# Patient Record
Sex: Female | Born: 1995 | Hispanic: Yes | Marital: Married | Smoking: Never smoker
Health system: Southern US, Community
[De-identification: ages and names within clinical notes are randomized; demographics above are authoritative.]

## PROBLEM LIST (undated history)

## (undated) ENCOUNTER — Inpatient Hospital Stay (HOSPITAL_COMMUNITY): Payer: Self-pay

## (undated) DIAGNOSIS — I10 Essential (primary) hypertension: Secondary | ICD-10-CM

## (undated) DIAGNOSIS — Z789 Other specified health status: Secondary | ICD-10-CM

## (undated) DIAGNOSIS — J111 Influenza due to unidentified influenza virus with other respiratory manifestations: Secondary | ICD-10-CM

## (undated) DIAGNOSIS — R51 Headache: Secondary | ICD-10-CM

## (undated) DIAGNOSIS — R519 Headache, unspecified: Secondary | ICD-10-CM

## (undated) HISTORY — PX: NO PAST SURGERIES: SHX2092

---

## 2015-11-07 ENCOUNTER — Ambulatory Visit (INDEPENDENT_AMBULATORY_CARE_PROVIDER_SITE_OTHER): Payer: Medicaid Other | Admitting: Advanced Practice Midwife

## 2015-11-07 ENCOUNTER — Other Ambulatory Visit (HOSPITAL_COMMUNITY)
Admission: RE | Admit: 2015-11-07 | Discharge: 2015-11-07 | Disposition: A | Discharge status: Home / Self Care | Payer: Medicaid Other | Source: Ambulatory Visit | Attending: Advanced Practice Midwife | Admitting: Advanced Practice Midwife

## 2015-11-07 ENCOUNTER — Encounter: Payer: Self-pay | Admitting: Family Medicine

## 2015-11-07 ENCOUNTER — Encounter: Payer: Self-pay | Admitting: Advanced Practice Midwife

## 2015-11-07 VITALS — BP 121/82 | HR 85 | Ht 62.0 in | Wt 175.4 lb

## 2015-11-07 DIAGNOSIS — K117 Disturbances of salivary secretion: Secondary | ICD-10-CM | POA: Diagnosis not present

## 2015-11-07 DIAGNOSIS — Z3492 Encounter for supervision of normal pregnancy, unspecified, second trimester: Secondary | ICD-10-CM

## 2015-11-07 DIAGNOSIS — Z124 Encounter for screening for malignant neoplasm of cervix: Secondary | ICD-10-CM | POA: Diagnosis not present

## 2015-11-07 DIAGNOSIS — R51 Headache: Secondary | ICD-10-CM | POA: Diagnosis not present

## 2015-11-07 DIAGNOSIS — Z01419 Encounter for gynecological examination (general) (routine) without abnormal findings: Secondary | ICD-10-CM | POA: Insufficient documentation

## 2015-11-07 DIAGNOSIS — O26899 Other specified pregnancy related conditions, unspecified trimester: Secondary | ICD-10-CM | POA: Insufficient documentation

## 2015-11-07 DIAGNOSIS — Z113 Encounter for screening for infections with a predominantly sexual mode of transmission: Secondary | ICD-10-CM | POA: Insufficient documentation

## 2015-11-07 DIAGNOSIS — O26892 Other specified pregnancy related conditions, second trimester: Secondary | ICD-10-CM

## 2015-11-07 DIAGNOSIS — Z3490 Encounter for supervision of normal pregnancy, unspecified, unspecified trimester: Secondary | ICD-10-CM | POA: Insufficient documentation

## 2015-11-07 DIAGNOSIS — R519 Headache, unspecified: Secondary | ICD-10-CM | POA: Insufficient documentation

## 2015-11-07 LAB — POCT URINALYSIS DIP (DEVICE)
BILIRUBIN URINE: NEGATIVE
GLUCOSE, UA: NEGATIVE mg/dL
HGB URINE DIPSTICK: NEGATIVE
Ketones, ur: NEGATIVE mg/dL
LEUKOCYTES UA: NEGATIVE
NITRITE: NEGATIVE
Protein, ur: NEGATIVE mg/dL
Specific Gravity, Urine: 1.02 (ref 1.005–1.030)
Urobilinogen, UA: 0.2 mg/dL (ref 0.0–1.0)
pH: 7 (ref 5.0–8.0)

## 2015-11-07 MED ORDER — GLYCOPYRROLATE 2 MG PO TABS: 2.0000 mg | ORAL_TABLET | Freq: Three times a day (TID) | ORAL | 3 refills | Status: DC | PRN

## 2015-11-07 NOTE — Progress Notes (Signed)
Here for first visit. Given new patient education packet.

## 2015-11-07 NOTE — Progress Notes (Addendum)
Subjective:    Deborah Steele is a G1P0 6514w1d being seen today for her first obstetrical visit. Dating is based on an estimated LMP and a 6w US for which [facility] not [facility] yet. Her obstetrical history is significant for obesity. Patient is not sure if she  intend to breast feed. Pregnancy history fully reviewed.  Patient reports headache. Believes these headaches are related to her wisdom teeth coming in because she has a lot of associated lower jaw pain. She has not yet been seen by a dentist.   Patient also reports a lot of congestion/mucus production that has started with this pregnancy.   Vitals:   11/07/15 0826 11/07/15 0829  BP: 121/82   Pulse: 85   Weight: 175 lb 6.4 oz (79.6 kg)   Height:  5\' 2"  (1.575 m)    HISTORY: OB History  Gravida Para Term Preterm AB Living  1            SAB TAB Ectopic Multiple Live Births               # Outcome Date GA Lbr Len/2nd Weight Sex Delivery Anes PTL Lv  1 Current              No past medical history on file. History reviewed. No pertinent surgical history. Family History  Problem Relation Age of Onset  . Hyperlipidemia Mother   . Hypertension Mother      Exam    Uterus:   ~16 week size  Pelvic Exam:    Perineum: No Hemorrhoids   Vulva: normal   Vagina:  normal mucosa   pH:    Cervix: no lesions   Adnexa: normal adnexa   Bony Pelvis: average  System: Breast:  breast exam not performed given age   Skin: normal coloration and turgor, no rashes    Neurologic: oriented, normal mood, grossly non-focal   Extremities: normal strength, tone, and muscle mass   HEENT extra ocular movement intact, thyroid without masses and trachea midline   Mouth/Teeth mucous membranes moist, pharynx normal without lesions   Neck supple   Cardiovascular: regular rate and rhythm   Respiratory:  appears well, vitals normal, no respiratory distress, acyanotic, normal RR, chest clear, no wheezing, crepitations, rhonchi,  normal symmetric air entry   Abdomen: soft, non-tender; bowel sounds normal; no masses,  no organomegaly   Urinary: urethral meatus normal      Assessment:    Pregnancy: G1P0 There are no active problems to display for this patient. 1. Supervision of normal pregnancy, second trimester  - POCT urinalysis dip (device) - Prenatal Multivit-Min-Fe-FA (PRENATAL VITAMINS PO); Take 1 tablet by mouth daily. - Glucose Tolerance, 1 HR (50g) - Prenatal Profile - Hemoglobinopathy Evaluation - Cytology - PAP - Culture, OB Urine - GC/Chlamydia probe amp ([place])not at [facility] - Pain Mgmt, Profile 6 Conf w/o mM, U - AFP, Quad Screen - US MFM OB COMP + 14 WK; Future  2. Ptyalism  - glycopyrrolate (ROBINUL) 2 MG tablet; Take 1 tablet (2 mg total) by mouth 3 (three) times daily as needed.  Dispense: 30 tablet; Refill: 3  3. Headache in pregnancy, antepartum, second trimester --Pt believes this is related to her teeth.  Recommend she see dentist. Dental letter provided.       Plan:     Initial labs drawn. PAP performed today.  1 hr GTT performed today due to obesity and ethnicity.  Prenatal vitamins. Problem list reviewed  and updated. Genetic Screening discussed Quad Screen: requested. Ultrasound discussed; fetal survey: ordered.        De HollingsheadCatherine L Cyndi Montejano 11/07/2015  CNM attestation:  I have seen and examined this patient; I agree with above documentation in the resident's note.   Deborah Steele is a 20 y.o. G1P0 @[redacted]w[redacted]d  by LMP in office for initial prenatal visit. Denies LOF, VB, contractions, vaginal discharge.  PE: BP 121/82   Pulse 85   Ht 5\' 2"  (1.575 m)   Wt 175 lb 6.4 oz (79.6 kg)   LMP 07/17/2015   BMI 32.08 kg/m  Gen: calm comfortable, NAD Resp: normal effort, no distress Abd: gravid  ROS, labs, PMH reviewed  FHT present by doppler  Plan: --Labs drawn today, including Quad screen --Anatomy US ordered at 19 weeks - continue routine follow up in OB  clinic  LEFTWICH-KIRBY, LISA, CNM 8:14 PM

## 2015-11-08 LAB — PAIN MGMT, PROFILE 6 CONF W/O MM, U
6 Acetylmorphine: NEGATIVE ng/mL (ref <10)
ALCOHOL METABOLITES: NEGATIVE ng/mL (ref <500)
AMPHETAMINES: NEGATIVE ng/mL (ref <500)
BENZODIAZEPINES: NEGATIVE ng/mL (ref <100)
Barbiturates: NEGATIVE ng/mL (ref <300)
CREATININE: 70.9 mg/dL (ref >=20.0)
Cocaine Metabolite: NEGATIVE ng/mL (ref <150)
MARIJUANA METABOLITE: NEGATIVE ng/mL (ref <20)
Methadone Metabolite: NEGATIVE ng/mL (ref <100)
OPIATES: NEGATIVE ng/mL (ref <100)
OXIDANT: NEGATIVE ug/mL (ref <200)
Oxycodone: NEGATIVE ng/mL (ref <100)
Phencyclidine: NEGATIVE ng/mL (ref <25)
Please note:: 0
pH: 7.65 (ref 4.5–9.0)

## 2015-11-08 LAB — GC/CHLAMYDIA PROBE AMP (~~LOC~~) NOT AT ARMC
CHLAMYDIA, DNA PROBE: NEGATIVE
NEISSERIA GONORRHEA: NEGATIVE

## 2015-11-08 LAB — PRENATAL PROFILE (SOLSTAS)
ANTIBODY SCREEN: NEGATIVE
BASOS PCT: 0 %
Basophils Absolute: 0 cells/uL (ref 0–200)
EOS PCT: 1 %
Eosinophils Absolute: 87 cells/uL (ref 15–500)
HEMATOCRIT: 36.2 % (ref 35.0–45.0)
HEMOGLOBIN: 12.1 g/dL (ref 11.7–15.5)
HIV 1&2 Ab, 4th Generation: NONREACTIVE
Hepatitis B Surface Ag: NEGATIVE
LYMPHS PCT: 19 %
Lymphs Abs: 1653 cells/uL (ref 850–3900)
MCH: 28.9 pg (ref 27.0–33.0)
MCHC: 33.4 g/dL (ref 32.0–36.0)
MCV: 86.6 fL (ref 80.0–100.0)
MONOS PCT: 7 %
MPV: 9.2 fL (ref 7.5–12.5)
Monocytes Absolute: 609 cells/uL (ref 200–950)
NEUTROS ABS: 6351 {cells}/uL (ref 1500–7800)
Neutrophils Relative %: 73 %
Platelets: 258 10*3/uL (ref 140–400)
RBC: 4.18 MIL/uL (ref 3.80–5.10)
RDW: 13.3 % (ref 11.0–15.0)
RH TYPE: POSITIVE
Rubella: 9.66 Index — ABNORMAL HIGH (ref <0.90)
WBC: 8.7 10*3/uL (ref 3.8–10.8)

## 2015-11-08 LAB — CULTURE, OB URINE: Organism ID, Bacteria: 10000

## 2015-11-08 LAB — GLUCOSE TOLERANCE, 1 HOUR (50G) W/O FASTING: Glucose, 1 Hr, gestational: 106 mg/dL (ref <140)

## 2015-11-09 ENCOUNTER — Telehealth: Payer: Self-pay | Admitting: *Deleted

## 2015-11-09 LAB — HEMOGLOBINOPATHY EVALUATION
HCT: 36.2 % (ref 35.0–45.0)
HEMOGLOBIN: 12.1 g/dL (ref 11.7–15.5)
Hgb A2 Quant: 2.7 % (ref 1.8–3.5)
Hgb A: 96.3 % (ref >96.0)
MCH: 28.9 pg (ref 27.0–33.0)
MCV: 86.6 fL (ref 80.0–100.0)
RBC: 4.18 MIL/uL (ref 3.80–5.10)
RDW: 13.3 % (ref 11.0–15.0)

## 2015-11-09 LAB — AFP, QUAD SCREEN
AFP: 23.3 ng/mL
Curr Gest Age: 16.1 weeks
HCG TOTAL: 112.27 [IU]/mL
INH: 217.2 pg/mL
INTERPRETATION-AFP: POSITIVE — AB
MOM FOR AFP: 0.78
MOM FOR INH: 1.33
MoM for hCG: 3.09
OPEN SPINA BIFIDA: NEGATIVE
Osb Risk: <1:27300 {titer}
TRI 18 SCR RISK EST: NEGATIVE
Trisomy 18 (Edward) Syndrome Interp.: 1:2540 {titer}
UE3 MOM: 0.35
uE3 Value: 0.29 ng/mL

## 2015-11-09 LAB — CYTOLOGY - PAP

## 2015-11-09 NOTE — Telephone Encounter (Signed)
Received a voice mail yesterday afternoon stating she is having pains.   Per chart review is [redacted] weeks pregnant. I called Deborah Steele and she reports having pain in waist area- then with further discussion states it is in her back and comes to the front. Denies pain with  Urination and per review urinalysis and culture from 11/07/15 negative. She denies any vaginal bleeding or discharge. States was having more pain yesterday- but today not much.  Wants to know what she can take. I advised may take tylenol and comfort measures- come to MAU if severe pain, or bleeding. She voices understanding.

## 2015-11-12 ENCOUNTER — Encounter: Payer: Self-pay | Admitting: General Practice

## 2015-11-12 NOTE — Progress Notes (Signed)
Patient had elevated quad screen risk for Downs. Patient has not had ultrasound yet to establish dating. Spoke with Dr Debroah LoopArnold regarding results who stated we can review dating from anatomy ultrasound on 9/6 at patient's next prenatal appt and evaluate need for referral for genetic counseling then once we have confirmed dating. May also recalculate quad with new dates if applicable

## 2015-11-19 ENCOUNTER — Encounter (HOSPITAL_COMMUNITY): Payer: Self-pay | Admitting: Advanced Practice Midwife

## 2015-11-21 ENCOUNTER — Telehealth: Payer: Self-pay | Admitting: *Deleted

## 2015-11-21 NOTE — Telephone Encounter (Signed)
Pt left message this morning stating that she has been having back problems and wants to know if she needs to be seen. I spoke to pt @ 1010 and discussed concerns. She reports that she has been out of work for a week due to back pain. She denied having an injury to cause the pain. She has tried OTC [employer] Hot with some relief. She has not taken tylenol. She also wants to know if this pain is normal and if it should increase with physical activity. She added that she works 5 hours daily and is not sure she can continue. I advised that she may try heating pad x30 - 60 minutes alternating with ice pack x20 minutes. While @ home she should alternate rest with light activity and try not to do too much at one time. She should also take tylenol per package directions. If she is not feeling better in 3-5 days, she may call back and see if a sooner appt is available. She is scheduled for next prenatal visit on 9/14 @ 0900. The type of pain which she has described is normal in pregnancy and the most common treatment is comfort measures. This is not typically a reason to be taken out of work for duration of pregnancy. Pt voiced understanding of all information and instructions given.

## 2015-11-28 ENCOUNTER — Ambulatory Visit (HOSPITAL_COMMUNITY)
Admission: RE | Admit: 2015-11-28 | Discharge: 2015-11-28 | Disposition: A | Discharge status: Home / Self Care | Payer: Medicaid Other | Source: Ambulatory Visit | Attending: Advanced Practice Midwife | Admitting: Advanced Practice Midwife

## 2015-11-28 ENCOUNTER — Encounter: Payer: Self-pay | Admitting: Advanced Practice Midwife

## 2015-11-28 DIAGNOSIS — Z36 Encounter for antenatal screening of mother: Secondary | ICD-10-CM | POA: Diagnosis not present

## 2015-11-28 DIAGNOSIS — Z3A16 16 weeks gestation of pregnancy: Secondary | ICD-10-CM | POA: Insufficient documentation

## 2015-11-28 DIAGNOSIS — Z3492 Encounter for supervision of normal pregnancy, unspecified, second trimester: Secondary | ICD-10-CM

## 2015-11-28 DIAGNOSIS — O280 Abnormal hematological finding on antenatal screening of mother: Secondary | ICD-10-CM | POA: Insufficient documentation

## 2015-12-05 ENCOUNTER — Telehealth: Payer: Self-pay | Admitting: *Deleted

## 2015-12-05 NOTE — Telephone Encounter (Signed)
Talked to [facility] this AM and talked to rep about recalculating patient's AFP Quad Screen.New due date by ultrasound given and she states will talk to her supervisor to see how to get this corrected and call back to office.

## 2015-12-06 ENCOUNTER — Ambulatory Visit (INDEPENDENT_AMBULATORY_CARE_PROVIDER_SITE_OTHER): Payer: Medicaid Other | Admitting: Advanced Practice Midwife

## 2015-12-06 VITALS — BP 134/78 | HR 82 | Wt 177.0 lb

## 2015-12-06 DIAGNOSIS — Z23 Encounter for immunization: Secondary | ICD-10-CM | POA: Diagnosis not present

## 2015-12-06 DIAGNOSIS — W460XXA Contact with hypodermic needle, initial encounter: Secondary | ICD-10-CM | POA: Insufficient documentation

## 2015-12-06 DIAGNOSIS — M5441 Lumbago with sciatica, right side: Secondary | ICD-10-CM

## 2015-12-06 DIAGNOSIS — Z3482 Encounter for supervision of other normal pregnancy, second trimester: Secondary | ICD-10-CM

## 2015-12-06 DIAGNOSIS — O9989 Other specified diseases and conditions complicating pregnancy, childbirth and the puerperium: Secondary | ICD-10-CM

## 2015-12-06 DIAGNOSIS — Z3492 Encounter for supervision of normal pregnancy, unspecified, second trimester: Secondary | ICD-10-CM

## 2015-12-06 DIAGNOSIS — W460XXD Contact with hypodermic needle, subsequent encounter: Secondary | ICD-10-CM

## 2015-12-06 LAB — POCT URINALYSIS DIP (DEVICE)
BILIRUBIN URINE: NEGATIVE
GLUCOSE, UA: NEGATIVE mg/dL
Hgb urine dipstick: NEGATIVE
KETONES UR: NEGATIVE mg/dL
Leukocytes, UA: NEGATIVE
Nitrite: NEGATIVE
PH: 6.5 (ref 5.0–8.0)
PROTEIN: NEGATIVE mg/dL
SPECIFIC GRAVITY, URINE: 1.015 (ref 1.005–1.030)
Urobilinogen, UA: 0.2 mg/dL (ref 0.0–1.0)

## 2015-12-06 MED ORDER — IBUPROFEN 600 MG PO TABS: 600.0000 mg | ORAL_TABLET | Freq: Four times a day (QID) | ORAL | 1 refills | Status: DC | PRN

## 2015-12-06 MED ORDER — POLYETHYLENE GLYCOL 3350 17 GM/SCOOP PO POWD: 17.0000 g | Freq: Every day | ORAL | 2 refills | Status: DC | PRN

## 2015-12-06 NOTE — Progress Notes (Signed)
Patient reports lower back pain & sciatic pain that is very severe at times making it difficult to walk or manage Patient's last quad was drawn too early- needs to be recollected (due date changed based off ultrasound) Scheduled follow u/s for 9/26 @ 10:15

## 2015-12-06 NOTE — Addendum Note (Signed)
Addended by: Dorathy KinsmanSMITH, Kam Kushnir on: 12/06/2015 11:05 AM   Modules accepted: Orders

## 2015-12-06 NOTE — Patient Instructions (Addendum)
Sciatica Sciatica is pain, weakness, numbness, or tingling along the path of the sciatic nerve. The nerve starts in the lower back and runs down the back of each leg. The nerve controls the muscles in the lower leg and in the back of the knee, while also providing sensation to the back of the thigh, lower leg, and the sole of your foot. Sciatica is a symptom of another medical condition. For instance, nerve damage or certain conditions, such as a herniated disk or bone spur on the spine, pinch or put pressure on the sciatic nerve. This causes the pain, weakness, or other sensations normally associated with sciatica. Generally, sciatica only affects one side of the body. CAUSES   Herniated or slipped disc.  Degenerative disk disease.  A pain disorder involving the narrow muscle in the buttocks (piriformis syndrome).  Pelvic injury or fracture.  Pregnancy.  Tumor (rare). SYMPTOMS  Symptoms can vary from mild to very severe. The symptoms usually travel from the low back to the buttocks and down the back of the leg. Symptoms can include:  Mild tingling or dull aches in the lower back, leg, or hip.  Numbness in the back of the calf or sole of the foot.  Burning sensations in the lower back, leg, or hip.  Sharp pains in the lower back, leg, or hip.  Leg weakness.  Severe back pain inhibiting movement. These symptoms may get worse with coughing, sneezing, laughing, or prolonged sitting or standing. Also, being overweight may worsen symptoms. DIAGNOSIS  Your caregiver will perform a physical exam to look for common symptoms of sciatica. He or she may ask you to do certain movements or activities that would trigger sciatic nerve pain. Other tests may be performed to find the cause of the sciatica. These may include:  Blood tests.  X-rays.  Imaging tests, such as an MRI or CT scan. TREATMENT  Treatment is directed at the cause of the sciatic pain. Sometimes, treatment is not necessary  and the pain and discomfort goes away on its own. If treatment is needed, your caregiver may suggest:  Over-the-counter medicines to relieve pain.  Prescription medicines, such as anti-inflammatory medicine, muscle relaxants, or narcotics.  Applying heat or ice to the painful area.  Steroid injections to lessen pain, irritation, and inflammation around the nerve.  Reducing activity during periods of pain.  Exercising and stretching to strengthen your abdomen and improve flexibility of your spine. Your caregiver may suggest losing weight if the extra weight makes the back pain worse.  Physical therapy.  Surgery to eliminate what is pressing or pinching the nerve, such as a bone spur or part of a herniated disk. HOME CARE INSTRUCTIONS   Only take over-the-counter or prescription medicines for pain or discomfort as directed by your caregiver.  Apply ice to the affected area for 20 minutes, 3-4 times a day for the first 48-72 hours. Then try heat in the same way.  Exercise, stretch, or perform your usual activities if these do not aggravate your pain.  Attend physical therapy sessions as directed by your caregiver.  Keep all follow-up appointments as directed by your caregiver.  Do not wear high heels or shoes that do not provide proper support.  Check your mattress to see if it is too soft. A firm mattress may lessen your pain and discomfort. SEEK IMMEDIATE MEDICAL CARE IF:   You lose control of your bowel or bladder (incontinence).  You have increasing weakness in the lower back, pelvis, buttocks,   or legs.  You have redness or swelling of your back.  You have a burning sensation when you urinate.  You have pain that gets worse when you lie down or awakens you at night.  Your pain is worse than you have experienced in the past.  Your pain is lasting longer than 4 weeks.  You are suddenly losing weight without reason. MAKE SURE YOU:  Understand these  instructions.  Will watch your condition.  Will get help right away if you are not doing well or get worse.   This information is not intended to replace advice given to you by your health care provider. Make sure you discuss any questions you have with your health care provider.   Document Released: 03/04/2001 Document Revised: 11/29/2014 Document Reviewed: 07/20/2011 Elsevier Interactive Patient Education 2016 [employer].    Constipation, Adult Constipation is when a person has fewer than three bowel movements a week, has difficulty having a bowel movement, or has stools that are dry, hard, or larger than normal. As people grow older, constipation is more common. A low-fiber diet, not taking in enough fluids, and taking certain medicines may make constipation worse.  CAUSES   Certain medicines, such as antidepressants, pain medicine, iron supplements, antacids, and water pills.   Certain diseases, such as diabetes, irritable bowel syndrome (IBS), thyroid disease, or depression.   Not drinking enough water.   Not eating enough fiber-rich foods.   Stress or travel.   Lack of physical activity or exercise.   Ignoring the urge to have a bowel movement.   Using laxatives too much.  SIGNS AND SYMPTOMS   Having fewer than three bowel movements a week.   Straining to have a bowel movement.   Having stools that are hard, dry, or larger than normal.   Feeling full or bloated.   Pain in the lower abdomen.   Not feeling relief after having a bowel movement.  DIAGNOSIS  Your health care provider will take a medical history and perform a physical exam. Further testing may be done for severe constipation. Some tests may include:  A barium enema X-ray to examine your rectum, colon, and, sometimes, your small intestine.   A sigmoidoscopy to examine your lower colon.   A colonoscopy to examine your entire colon. TREATMENT  Treatment will depend on the severity  of your constipation and what is causing it. Some dietary treatments include drinking more fluids and eating more fiber-rich foods. Lifestyle treatments may include regular exercise. If these diet and lifestyle recommendations do not help, your health care provider may recommend taking over-the-counter laxative medicines to help you have bowel movements. Prescription medicines may be prescribed if over-the-counter medicines do not work.  HOME CARE INSTRUCTIONS   Eat foods that have a lot of fiber, such as fruits, vegetables, whole grains, and beans.  Limit foods high in fat and processed sugars, such as french fries, hamburgers, cookies, candies, and soda.   A fiber supplement may be added to your diet if you cannot get enough fiber from foods.   Drink enough fluids to keep your urine clear or pale yellow.   Exercise regularly or as directed by your health care provider.   Go to the restroom when you have the urge to go. Do not hold it.   Only take over-the-counter or prescription medicines as directed by your health care provider. Do not take other medicines for constipation without talking to your health care provider first.  [facility] IMMEDIATE MEDICAL  CARE IF:   You have bright red blood in your stool.   Your constipation lasts for more than 4 days or gets worse.   You have abdominal or rectal pain.   You have thin, pencil-like stools.   You have unexplained weight loss. MAKE SURE YOU:   Understand these instructions.  Will watch your condition.  Will get help right away if you are not doing well or get worse.   This information is not intended to replace advice given to you by your health care provider. Make sure you discuss any questions you have with your health care provider.   Document Released: 12/07/2003 Document Revised: 03/31/2014 Document Reviewed: 12/20/2012 Elsevier Interactive Patient Education 2016 [employer]  Safe Medications in Pregnancy    Acne: Benzoyl Peroxide Salicylic Acid  Backache/Headache: Tylenol: 2 regular strength every 4 hours OR              2 Extra strength every 6 hours  Colds/Coughs/Allergies: Benadryl (alcohol free) 25 mg every 6 hours as needed Breath right strips Claritin Cepacol throat lozenges Chloraseptic throat spray Cold-Eeze- up to three times per day Cough drops, alcohol free Flonase (by prescription only) Guaifenesin Mucinex Robitussin DM (plain only, alcohol free) Saline nasal spray/drops Sudafed (pseudoephedrine) & Actifed ** use only after [redacted] weeks gestation and if you do not have high blood pressure Tylenol Vicks Vaporub Zinc lozenges Zyrtec   Constipation: Colace Ducolax suppositories Fleet enema Glycerin suppositories Metamucil Milk of magnesia Miralax Senokot Smooth move tea  Diarrhea: Kaopectate Imodium A-D  *NO pepto Bismol  Hemorrhoids: Anusol Anusol HC Preparation H Tucks  Indigestion: Tums Maalox Mylanta Zantac  Pepcid  Insomnia: Benadryl (alcohol free) 25mg  every 6 hours as needed Tylenol PM Unisom, no Gelcaps  Leg Cramps: Tums MagGel  Nausea/Vomiting:  Bonine Dramamine Emetrol Ginger extract Sea bands Meclizine  Nausea medication to take during pregnancy:  Unisom (doxylamine succinate 25 mg tablets) Take one tablet daily at bedtime. If symptoms are not adequately controlled, the dose can be increased to a maximum recommended dose of two tablets daily (1/2 tablet in the morning, 1/2 tablet mid-afternoon and one at bedtime). Vitamin B6 100mg  tablets. Take one tablet twice a day (up to 200 mg per day).  Skin Rashes: Aveeno products Benadryl cream or 25mg  every 6 hours as needed Calamine Lotion 1% cortisone cream  Yeast infection: Gyne-lotrimin 7 Monistat 7   **If taking multiple medications, please check labels to avoid duplicating the same active ingredients **take medication as directed on the label ** Do not exceed 4000  mg of tylenol in 24 hours **Do not take medications that contain aspirin or ibuprofen

## 2015-12-06 NOTE — Progress Notes (Signed)
   PRENATAL VISIT NOTE  Subjective:  Pennie Rushingna Talton is a 20 y.o. G1P0 at 3432w0d being seen today for ongoing prenatal care.  She is currently monitored for the following issues for this low-risk pregnancy and has Supervision of normal pregnancy; Ptyalism; Headache in pregnancy, antepartum; Abnormal quad screen; and Needle stick, hypodermic, accidental on her problem list.  Patient reports backache.  Contractions: Not present. Vag. Bleeding: None.  Movement: Absent. Denies leaking of fluid.   The following portions of the patient's history were reviewed and updated as appropriate: allergies, current medications, past family history, past medical history, past social history, past surgical history and problem list. Problem list updated.  Objective:   Vitals:   12/06/15 0941  BP: 134/78  Pulse: 82  Weight: 177 lb (80.3 kg)    Fetal Status: Fetal Heart Rate (bpm): 152   Movement: Absent     General:  Alert, oriented and cooperative. Patient is in no acute distress.  Skin: Skin is warm and dry. No rash noted.   Cardiovascular: Normal heart rate noted  Respiratory: Normal respiratory effort, no problems with respiration noted  Abdomen: Soft, gravid, appropriate for gestational age. Pain/Pressure: Present     Pelvic:  Cervical exam deferred        Extremities: Normal range of motion.  Edema: Trace  Mental Status: Normal mood and affect. Normal behavior. Normal judgment and thought content.   Urinalysis: Urine Protein: Negative Urine Glucose: Negative  Assessment and Plan:  Pregnancy: G1P0 at 632w0d  1. Supervision of normal pregnancy in second trimester  - US MFM OB FOLLOW UP; Future - Flu Vaccine QUAD 36+ mos IM (Fluarix, Quad PF)  2. Needle stick, hypodermic, accidental, subsequent encounter   3. Acute back pain with sciatica, right   Preterm labor symptoms and general obstetric precautions including but not limited to vaginal bleeding, contractions, leaking of fluid and fetal  movement were reviewed in detail with the patient. Please refer to After Visit Summary for other counseling recommendations.  Return in about 4 weeks (around 01/03/2016) for ROB.  Dorathy KinsmanVirginia Mozelle Remlinger, CNM

## 2015-12-11 LAB — AFP, QUAD SCREEN
AFP: 60.4 ng/mL
Curr Gest Age: 18 weeks
HCG TOTAL: 39.83 [IU]/mL
INH: 153.7 pg/mL
INTERPRETATION-AFP: NEGATIVE
MOM FOR AFP: 1.64
MOM FOR INH: 1.21
MoM for hCG: 1.89
OPEN SPINA BIFIDA: NEGATIVE
TRI 18 SCR RISK EST: NEGATIVE
Trisomy 18 (Edward) Syndrome Interp.: 1:90900 {titer}
UE3 MOM: 0.73
UE3 VALUE: 1.06 ng/mL

## 2015-12-12 ENCOUNTER — Telehealth: Payer: Self-pay | Admitting: *Deleted

## 2015-12-12 NOTE — Telephone Encounter (Signed)
Patient made aware that recalculated QAUD screen result made her AFP result normal.

## 2015-12-18 ENCOUNTER — Ambulatory Visit (HOSPITAL_COMMUNITY)
Admission: RE | Admit: 2015-12-18 | Discharge: 2015-12-18 | Disposition: A | Discharge status: Home / Self Care | Payer: Medicaid Other | Source: Ambulatory Visit | Attending: Advanced Practice Midwife | Admitting: Advanced Practice Midwife

## 2015-12-18 ENCOUNTER — Other Ambulatory Visit: Payer: Self-pay | Admitting: Advanced Practice Midwife

## 2015-12-18 DIAGNOSIS — Z36 Encounter for antenatal screening of mother: Secondary | ICD-10-CM | POA: Insufficient documentation

## 2015-12-18 DIAGNOSIS — Z0489 Encounter for examination and observation for other specified reasons: Secondary | ICD-10-CM

## 2015-12-18 DIAGNOSIS — Z3A19 19 weeks gestation of pregnancy: Secondary | ICD-10-CM | POA: Diagnosis not present

## 2015-12-18 DIAGNOSIS — IMO0002 Reserved for concepts with insufficient information to code with codable children: Secondary | ICD-10-CM

## 2015-12-18 DIAGNOSIS — Z3492 Encounter for supervision of normal pregnancy, unspecified, second trimester: Secondary | ICD-10-CM

## 2015-12-27 ENCOUNTER — Inpatient Hospital Stay (HOSPITAL_COMMUNITY)
Admission: AD | Admit: 2015-12-27 | Discharge: 2015-12-27 | Disposition: A | Discharge status: Home / Self Care | Payer: Medicaid Other | Source: Ambulatory Visit | Attending: Obstetrics & Gynecology | Admitting: Obstetrics & Gynecology

## 2015-12-27 ENCOUNTER — Encounter (HOSPITAL_COMMUNITY): Payer: Self-pay | Admitting: *Deleted

## 2015-12-27 DIAGNOSIS — R109 Unspecified abdominal pain: Secondary | ICD-10-CM | POA: Insufficient documentation

## 2015-12-27 DIAGNOSIS — N803C9 Endometriosis of the uterosacral ligament(s), unspecified side, unspecified depth: Secondary | ICD-10-CM

## 2015-12-27 DIAGNOSIS — Z3A21 21 weeks gestation of pregnancy: Secondary | ICD-10-CM | POA: Diagnosis not present

## 2015-12-27 DIAGNOSIS — N803 Endometriosis of pelvic peritoneum: Secondary | ICD-10-CM

## 2015-12-27 DIAGNOSIS — O26892 Other specified pregnancy related conditions, second trimester: Secondary | ICD-10-CM | POA: Insufficient documentation

## 2015-12-27 DIAGNOSIS — O280 Abnormal hematological finding on antenatal screening of mother: Secondary | ICD-10-CM

## 2015-12-27 HISTORY — DX: Other specified health status: Z78.9

## 2015-12-27 LAB — URINALYSIS, ROUTINE W REFLEX MICROSCOPIC
Bilirubin Urine: NEGATIVE
GLUCOSE, UA: NEGATIVE mg/dL
Hgb urine dipstick: NEGATIVE
Ketones, ur: NEGATIVE mg/dL
LEUKOCYTES UA: NEGATIVE
Nitrite: NEGATIVE
PROTEIN: NEGATIVE mg/dL
SPECIFIC GRAVITY, URINE: 1.02 (ref 1.005–1.030)
pH: 6 (ref 5.0–8.0)

## 2015-12-27 NOTE — MAU Provider Note (Signed)
None     Chief Complaint:  Abdominal Pain   Deborah Steele is  20 y.o. G1P0 at 4859w0d presents complaining of Abdominal Pain .  It happened twice along the right side of her pelvis.  Pain c/w round ligament Obstetrical/Gynecological History: OB History    Gravida Para Term Preterm AB Living   1         0   SAB TAB Ectopic Multiple Live Births                 Past Medical History: Past Medical History:  Diagnosis Date  . Medical history non-contributory     Past Surgical History: Past Surgical History:  Procedure Laterality Date  . NO PAST SURGERIES      Family History: Family History  Problem Relation Age of Onset  . Hyperlipidemia Mother   . Hypertension Mother     Social History: Social History  Substance Use Topics  . Smoking status: Never Smoker  . Smokeless tobacco: Never Used  . Alcohol use No    Allergies: No Known Allergies  Meds:  Prescriptions Prior to Admission  Medication Sig Dispense Refill Last Dose  . acetaminophen (TYLENOL) 500 MG tablet Take 500 mg by mouth every 6 (six) hours as needed.   Past Month at Unknown time  . glycopyrrolate (ROBINUL) 2 MG tablet Take 1 tablet (2 mg total) by mouth 3 (three) times daily as needed. 30 tablet 3 Past Month at Unknown time  . Prenatal Vit-Fe Fumarate-FA (PRENATAL MULTIVITAMIN) TABS tablet Take 1 tablet by mouth daily at 12 noon.   12/26/2015 at Unknown time  . ibuprofen (ADVIL,MOTRIN) 600 MG tablet Take 1 tablet (600 mg total) by mouth every 6 (six) hours as needed. 30 tablet 1 Has not started  . polyethylene glycol powder (GLYCOLAX/MIRALAX) powder Take 17 g by mouth daily as needed for moderate constipation. 500 g 2 Has not started    Review of Systems   Constitutional: Negative for fever and chills Eyes: Negative for visual disturbances Respiratory: Negative for shortness of breath, dyspnea Cardiovascular: Negative for chest pain or palpitations  Gastrointestinal: Negative for vomiting, diarrhea and  constipation Genitourinary: Negative for dysuria and urgency Musculoskeletal: Negative for back pain, joint pain, myalgias.  Normal ROM  Neurological: Negative for dizziness and headaches    Physical Exam  Blood pressure 122/75, pulse 88, temperature 98.2 F (36.8 C), temperature source Oral, resp. rate 19, height 5\' 2"  (1.575 m), weight 83.5 kg (184 lb), last menstrual period 07/17/2015, SpO2 100 %. GENERAL: Well-developed, well-nourished female in no acute distress.  LUNGS: Clear to auscultation bilaterally.  HEART: Regular rate and rhythm. ABDOMEN: Soft, nontender, nondistended, gravid.  EXTREMITIES: Nontender, no edema, 2+ distal pulses. DTR's 2+ FHT 150 doppler Labs: Results for orders placed or performed during the hospital encounter of 12/27/15 (from the past 24 hour(s))  Urinalysis, Routine w reflex microscopic ([facility])   Collection Time: 12/27/15  8:43 PM  Result Value Ref Range   Color, Urine YELLOW YELLOW   APPearance CLEAR CLEAR   Specific Gravity, Urine 1.020 1.005 - 1.030   pH 6.0 5.0 - 8.0   Glucose, UA NEGATIVE NEGATIVE mg/dL   Hgb urine dipstick NEGATIVE NEGATIVE   Bilirubin Urine NEGATIVE NEGATIVE   Ketones, ur NEGATIVE NEGATIVE mg/dL   Protein, ur NEGATIVE NEGATIVE mg/dL   Nitrite NEGATIVE NEGATIVE   Leukocytes, UA NEGATIVE NEGATIVE   Imaging Studies:  [place] Mfm Ob Comp + 14 Wk  Result Date: 11/28/2015 OBSTETRICAL  ULTRASOUND: This exam was performed within a [place] [facility]. The OB US report was generated in the AS system, and faxed to the ordering physician.  This report is available in the [employer]. See the AS Obstetric US report via the Image Link.  [place] Mfm Ob Follow Up  Result Date: 12/18/2015 OBSTETRICAL ULTRASOUND: This exam was performed within a [place] [facility]. The OB US report was generated in the AS system, and faxed to the ordering physician.  This report is available in the [employer]. See the AS  Obstetric US report via the Image Link.   Assessment: Deborah Steele is  19 y.o. G1P0 at [redacted]w[redacted]d presents with round ligament pain..  Plan: DC home. Tips given  CRESENZO-DISHMAN,Braden Cimo 10/5/20179:59 PM

## 2015-12-27 NOTE — MAU Note (Signed)
Pt c/o RLQ pain that she noticed yesterday but today got much worse. States that the pain is achy and sometimes crampy. Rates 5/10. Has not taken any medications or warm compress. Hurts more with movement. Denies vaginal bleeding or discharge.

## 2015-12-27 NOTE — Discharge Instructions (Signed)

## 2016-01-03 ENCOUNTER — Ambulatory Visit (INDEPENDENT_AMBULATORY_CARE_PROVIDER_SITE_OTHER): Payer: Medicaid Other | Admitting: Obstetrics and Gynecology

## 2016-01-03 DIAGNOSIS — Z3402 Encounter for supervision of normal first pregnancy, second trimester: Secondary | ICD-10-CM

## 2016-01-03 DIAGNOSIS — Z3400 Encounter for supervision of normal first pregnancy, unspecified trimester: Secondary | ICD-10-CM | POA: Insufficient documentation

## 2016-01-03 NOTE — Patient Instructions (Signed)
Round Ligament Pain  The round ligament is a cord of muscle and tissue that helps to support the uterus. It can become a source of pain during pregnancy if it becomes stretched or twisted as the baby grows. The pain usually begins in the second trimester of pregnancy, and it can come and go until the baby is delivered. It is not a serious problem, and it does not cause harm to the baby.  Round ligament pain is usually a short, sharp, and pinching pain, but it can also be a dull, lingering, and aching pain. The pain is felt in the lower side of the abdomen or in the groin. It usually starts deep in the groin and moves up to the outside of the hip area. Pain can occur with:   A sudden change in position.   Rolling over in bed.   Coughing or sneezing.   Physical activity.  HOME CARE INSTRUCTIONS  Watch your condition for any changes. Take these steps to help with your pain:   When the pain starts, relax. Then try:    Sitting down.    Flexing your knees up to your abdomen.    Lying on your side with one pillow under your abdomen and another pillow between your legs.    Sitting in a warm bath for 15-20 minutes or until the pain goes away.   Take over-the-counter and prescription medicines only as told by your health care provider.   Move slowly when you sit and stand.   Avoid long walks if they cause pain.   Stop or lessen your physical activities if they cause pain.  SEEK MEDICAL CARE IF:   Your pain does not go away with treatment.   You feel pain in your back that you did not have before.   Your medicine is not helping.  SEEK IMMEDIATE MEDICAL CARE IF:   You develop a fever or chills.   You develop uterine contractions.   You develop vaginal bleeding.   You develop nausea or vomiting.   You develop diarrhea.   You have pain when you urinate.     This information is not intended to replace advice given to you by your health care provider. Make sure you discuss any questions you have with your health  care provider.     Document Released: 12/18/2007 Document Revised: 06/02/2011 Document Reviewed: 05/17/2014  Elsevier Interactive Patient Education 2016 Elsevier Inc.

## 2016-01-03 NOTE — Progress Notes (Signed)
Subjective:  Deborah Steele is a 20 y.o. G1P0 at 4842w0d being seen today for ongoing prenatal care.  She is currently monitored for the following issues for this low-risk pregnancy and has Ptyalism; Headache in pregnancy, antepartum; Needle stick, hypodermic, accidental; and Supervision of low-risk first pregnancy on her problem list.  Patient reports round liagment pain.   .  .  Movement: Present. Denies leaking of fluid.   The following portions of the patient's history were reviewed and updated as appropriate: allergies, current medications, past family history, past medical history, past social history, past surgical history and problem list. Problem list updated.  Objective:   Vitals:   01/03/16 0812  BP: 121/77  Pulse: 84  Weight: 186 lb 1.6 oz (84.4 kg)    Fetal Status: Fetal Heart Rate (bpm): 145   Movement: Present     General:  Alert, oriented and cooperative. Patient is in no acute distress.  Skin: Skin is warm and dry. No rash noted.   Cardiovascular: Normal heart rate noted  Respiratory: Normal respiratory effort, no problems with respiration noted  Abdomen: Soft, gravid, appropriate for gestational age. Pain/Pressure: Present     Pelvic:  Cervical exam deferred        Extremities: Normal range of motion.     Mental Status: Normal mood and affect. Normal behavior. Normal judgment and thought content.   Urinalysis:      Assessment and Plan:  Pregnancy: G1P0 at 6542w0d  1. Encounter for supervision of low-risk first pregnancy in second trimester Round ligament pain reviewed with pt.  Preterm labor symptoms and general obstetric precautions including but not limited to vaginal bleeding, contractions, leaking of fluid and fetal movement were reviewed in detail with the patient. Please refer to After Visit Summary for other counseling recommendations.  Return in about 4 weeks (around 01/31/2016) for OB visit.   Hermina StaggersMichael L Nanna Ertle, MD

## 2016-01-31 ENCOUNTER — Ambulatory Visit (INDEPENDENT_AMBULATORY_CARE_PROVIDER_SITE_OTHER): Payer: Medicaid Other | Admitting: Family

## 2016-01-31 DIAGNOSIS — Z3482 Encounter for supervision of other normal pregnancy, second trimester: Secondary | ICD-10-CM

## 2016-01-31 DIAGNOSIS — Z3402 Encounter for supervision of normal first pregnancy, second trimester: Secondary | ICD-10-CM

## 2016-01-31 NOTE — Patient Instructions (Signed)

## 2016-01-31 NOTE — Progress Notes (Signed)
   PRENATAL VISIT NOTE  Subjective:  Deborah Steele is a 20 y.o. G1P0 at 205w0d being seen today for ongoing prenatal care.  She is currently monitored for the following issues for this low-risk pregnancy and has Ptyalism; Headache in pregnancy, antepartum; Needle stick, hypodermic, accidental; and Supervision of low-risk first pregnancy on her problem list.  Patient reports backache.  Contractions: Not present.  .  Movement: Present. Denies leaking of fluid.   The following portions of the patient's history were reviewed and updated as appropriate: allergies, current medications, past family history, past medical history, past social history, past surgical history and problem list. Problem list updated.  Objective:   Vitals:   01/31/16 0813  BP: 133/72  Pulse: 87  Weight: 193 lb 9.6 oz (87.8 kg)    Fetal Status: Fetal Heart Rate (bpm): 154   Movement: Present     General:  Alert, oriented and cooperative. Patient is in no acute distress.  Skin: Skin is warm and dry. No rash noted.   Cardiovascular: Normal heart rate noted  Respiratory: Normal respiratory effort, no problems with respiration noted  Abdomen: Soft, gravid, appropriate for gestational age. Pain/Pressure: Present     Pelvic:  Cervical exam deferred        Extremities: Normal range of motion.     Mental Status: Normal mood and affect. Normal behavior. Normal judgment and thought content.   Assessment and Plan:  Pregnancy: G1P0 at 355w0d  1. Encounter for supervision of low-risk first pregnancy in second trimester  Preterm labor symptoms and general obstetric precautions including but not limited to vaginal bleeding, contractions, leaking of fluid and fetal movement were reviewed in detail with the patient. Encouraged physical exercise (30 min daily) and yoga to relieve backache. Reviewed instructions for 2 hour GTT.  Please refer to After Visit Summary for other counseling recommendations.  Return in about 2 weeks (around  02/14/2016) for 2 hr fasting GTT and prenatal visit.   Deborah Steele, CNM

## 2016-02-19 ENCOUNTER — Ambulatory Visit (INDEPENDENT_AMBULATORY_CARE_PROVIDER_SITE_OTHER): Payer: Medicaid Other | Admitting: Family

## 2016-02-19 VITALS — BP 130/83 | HR 95 | Wt 198.0 lb

## 2016-02-19 DIAGNOSIS — Z3403 Encounter for supervision of normal first pregnancy, third trimester: Secondary | ICD-10-CM

## 2016-02-19 DIAGNOSIS — Z3493 Encounter for supervision of normal pregnancy, unspecified, third trimester: Secondary | ICD-10-CM | POA: Diagnosis present

## 2016-02-19 DIAGNOSIS — N898 Other specified noninflammatory disorders of vagina: Secondary | ICD-10-CM | POA: Diagnosis not present

## 2016-02-19 DIAGNOSIS — O26893 Other specified pregnancy related conditions, third trimester: Secondary | ICD-10-CM

## 2016-02-19 DIAGNOSIS — Z23 Encounter for immunization: Secondary | ICD-10-CM | POA: Diagnosis not present

## 2016-02-19 LAB — CBC
HEMATOCRIT: 34 % — AB (ref 35.0–45.0)
Hemoglobin: 11.2 g/dL — ABNORMAL LOW (ref 11.7–15.5)
MCH: 28.6 pg (ref 27.0–33.0)
MCHC: 32.9 g/dL (ref 32.0–36.0)
MCV: 87 fL (ref 80.0–100.0)
MPV: 9 fL (ref 7.5–12.5)
PLATELETS: 265 10*3/uL (ref 140–400)
RBC: 3.91 MIL/uL (ref 3.80–5.10)
RDW: 14 % (ref 11.0–15.0)
WBC: 10.1 10*3/uL (ref 3.8–10.8)

## 2016-02-19 MED ORDER — TETANUS-DIPHTH-ACELL PERTUSSIS 5-2.5-18.5 LF-MCG/0.5 IM SUSP
0.5000 mL | Freq: Once | INTRAMUSCULAR | Status: AC
  Administered 2016-02-19: 0.5 mL via INTRAMUSCULAR

## 2016-02-19 NOTE — Progress Notes (Signed)
28 wk labs today 28 wk packet given  tdap vaccine given today

## 2016-02-19 NOTE — Progress Notes (Signed)
   PRENATAL VISIT NOTE  Subjective:  Deborah Steele is a 20 y.o. G1P0 at 9162w5d being seen today for ongoing prenatal care.  She is currently monitored for the following issues for this low-risk pregnancy and has Ptyalism; Headache in pregnancy, antepartum; Needle stick, hypodermic, accidental; Supervision of low-risk first pregnancy; and Vaginal discharge during pregnancy in third trimester on her problem list.  Patient reports vaginal discharge, intermittent with odor.  No report of itching.  Contractions: Not present. Vag. Bleeding: None.  Movement: Present. Denies leaking of fluid.   The following portions of the patient's history were reviewed and updated as appropriate: allergies, current medications, past family history, past medical history, past social history, past surgical history and problem list. Problem list updated.  Objective:   Vitals:   02/19/16 0813  BP: 130/83  Pulse: 95  Weight: 198 lb (89.8 kg)    Fetal Status: Fetal Heart Rate (bpm): 153 Fundal Height: 29 cm Movement: Present     General:  Alert, oriented and cooperative. Patient is in no acute distress.  Skin: Skin is warm and dry. No rash noted.   Cardiovascular: Normal heart rate noted  Respiratory: Normal respiratory effort, no problems with respiration noted  Abdomen: Soft, gravid, appropriate for gestational age. Pain/Pressure: Present     Pelvic:  Cervical exam deferred        Extremities: Normal range of motion.  Edema: Trace  Mental Status: Normal mood and affect. Normal behavior. Normal judgment and thought content.   Assessment and Plan:  Pregnancy: G1P0 at 7362w5d  1. Supervision of low-risk pregnancy, third trimester - Glucose Tolerance, 2 Hours w/1 Hour - CBC - RPR - HIV antibody (with reflex)  2. Encounter for supervision of low-risk first pregnancy in third trimester - Reviewed new OB lab results and ultrasound date change.  3. Vaginal discharge during pregnancy in third trimester - Wet  prep, genital  Preterm labor symptoms and general obstetric precautions including but not limited to vaginal bleeding, contractions, leaking of fluid and fetal movement were reviewed in detail with the patient. Please refer to After Visit Summary for other counseling recommendations.  Return in about 2 weeks (around 03/04/2016).   Eino FarberWalidah Kennith GainN Karim, CNM

## 2016-02-20 LAB — WET PREP, GENITAL
Trich, Wet Prep: NONE SEEN
YEAST WET PREP: NONE SEEN

## 2016-02-20 LAB — HIV ANTIBODY (ROUTINE TESTING W REFLEX): HIV 1&2 Ab, 4th Generation: NONREACTIVE

## 2016-02-20 LAB — GLUCOSE TOLERANCE, 2 HOURS W/ 1HR
GLUCOSE, 2 HOUR: 123 mg/dL (ref <140)
GLUCOSE: 147 mg/dL
Glucose, Fasting: 77 mg/dL (ref 65–99)

## 2016-02-20 LAB — RPR

## 2016-02-22 ENCOUNTER — Other Ambulatory Visit: Payer: Self-pay | Admitting: Family

## 2016-02-22 DIAGNOSIS — B9689 Other specified bacterial agents as the cause of diseases classified elsewhere: Secondary | ICD-10-CM

## 2016-02-22 DIAGNOSIS — N760 Acute vaginitis: Principal | ICD-10-CM

## 2016-02-22 DIAGNOSIS — J111 Influenza due to unidentified influenza virus with other respiratory manifestations: Secondary | ICD-10-CM

## 2016-02-22 HISTORY — DX: Influenza due to unidentified influenza virus with other respiratory manifestations: J11.1

## 2016-02-22 MED ORDER — METRONIDAZOLE 500 MG PO TABS: 500.0000 mg | ORAL_TABLET | Freq: Two times a day (BID) | ORAL | 0 refills | Status: DC

## 2016-02-22 NOTE — Progress Notes (Signed)
Pt called and given results of third trimester labs and wet prep.  Plans to pick up flagyl at the pharmacy.

## 2016-02-26 ENCOUNTER — Telehealth: Payer: Self-pay

## 2016-02-26 NOTE — Telephone Encounter (Signed)
Pt called requesting another Rx but could not hear what her request was.

## 2016-03-04 NOTE — Telephone Encounter (Signed)
Called patient back and she states she lost her pills and had 3 remaining. Patient states she doesn't know if she needs another refill or not. Patient denies irriation, itching or odor. Told patient she may not need additional treatment then and she could possibly be reswabbed if she would like. Patient verbalized understanding and states she has been congested and having a cough for a couple days now. Reviewed approved OTC cold/cough medications with patient. Patient verbalized understanding and had no questions

## 2016-03-05 ENCOUNTER — Inpatient Hospital Stay (HOSPITAL_COMMUNITY)
Admission: AD | Admit: 2016-03-05 | Discharge: 2016-03-05 | Disposition: A | Discharge status: Home / Self Care | Payer: Medicaid Other | Source: Ambulatory Visit | Attending: Obstetrics and Gynecology | Admitting: Obstetrics and Gynecology

## 2016-03-05 ENCOUNTER — Inpatient Hospital Stay (HOSPITAL_COMMUNITY): Payer: Medicaid Other

## 2016-03-05 ENCOUNTER — Encounter: Payer: Self-pay | Admitting: General Practice

## 2016-03-05 ENCOUNTER — Encounter: Payer: Medicaid Other | Admitting: Medical

## 2016-03-05 ENCOUNTER — Telehealth: Payer: Self-pay | Admitting: General Practice

## 2016-03-05 DIAGNOSIS — R509 Fever, unspecified: Secondary | ICD-10-CM | POA: Diagnosis not present

## 2016-03-05 DIAGNOSIS — O9989 Other specified diseases and conditions complicating pregnancy, childbirth and the puerperium: Secondary | ICD-10-CM | POA: Diagnosis not present

## 2016-03-05 DIAGNOSIS — Z23 Encounter for immunization: Secondary | ICD-10-CM | POA: Diagnosis not present

## 2016-03-05 DIAGNOSIS — J09X2 Influenza due to identified novel influenza A virus with other respiratory manifestations: Secondary | ICD-10-CM

## 2016-03-05 DIAGNOSIS — Z3A30 30 weeks gestation of pregnancy: Secondary | ICD-10-CM | POA: Insufficient documentation

## 2016-03-05 DIAGNOSIS — R05 Cough: Secondary | ICD-10-CM | POA: Diagnosis present

## 2016-03-05 DIAGNOSIS — O26893 Other specified pregnancy related conditions, third trimester: Secondary | ICD-10-CM | POA: Diagnosis not present

## 2016-03-05 LAB — COMPREHENSIVE METABOLIC PANEL
ALT: 24 U/L (ref 14–54)
AST: 22 U/L (ref 15–41)
Albumin: 3.1 g/dL — ABNORMAL LOW (ref 3.5–5.0)
Alkaline Phosphatase: 105 U/L (ref 38–126)
Anion gap: 9 (ref 5–15)
BILIRUBIN TOTAL: 0.4 mg/dL (ref 0.3–1.2)
BUN: 5 mg/dL — ABNORMAL LOW (ref 6–20)
CALCIUM: 9 mg/dL (ref 8.9–10.3)
CHLORIDE: 104 mmol/L (ref 101–111)
CO2: 21 mmol/L — ABNORMAL LOW (ref 22–32)
CREATININE: 0.57 mg/dL (ref 0.44–1.00)
Glucose, Bld: 96 mg/dL (ref 65–99)
Potassium: 3.6 mmol/L (ref 3.5–5.1)
Sodium: 134 mmol/L — ABNORMAL LOW (ref 135–145)
TOTAL PROTEIN: 6.6 g/dL (ref 6.5–8.1)

## 2016-03-05 LAB — URINALYSIS, ROUTINE W REFLEX MICROSCOPIC
Bilirubin Urine: NEGATIVE
Glucose, UA: NEGATIVE mg/dL
HGB URINE DIPSTICK: NEGATIVE
Ketones, ur: NEGATIVE mg/dL
LEUKOCYTES UA: NEGATIVE
NITRITE: NEGATIVE
PROTEIN: NEGATIVE mg/dL
SPECIFIC GRAVITY, URINE: 1.004 — AB (ref 1.005–1.030)
pH: 8 (ref 5.0–8.0)

## 2016-03-05 LAB — CBC
HCT: 35.3 % — ABNORMAL LOW (ref 36.0–46.0)
Hemoglobin: 12.2 g/dL (ref 12.0–15.0)
MCH: 29 pg (ref 26.0–34.0)
MCHC: 34.6 g/dL (ref 30.0–36.0)
MCV: 84 fL (ref 78.0–100.0)
PLATELETS: 263 10*3/uL (ref 150–400)
RBC: 4.2 MIL/uL (ref 3.87–5.11)
RDW: 14.1 % (ref 11.5–15.5)
WBC: 10.7 10*3/uL — AB (ref 4.0–10.5)

## 2016-03-05 LAB — LACTIC ACID, PLASMA
Lactic Acid, Venous: 1.1 mmol/L (ref 0.5–1.9)
Lactic Acid, Venous: 1.1 mmol/L (ref 0.5–1.9)

## 2016-03-05 LAB — INFLUENZA PANEL BY PCR (TYPE A & B)
Influenza A By PCR: POSITIVE — AB
Influenza B By PCR: NEGATIVE

## 2016-03-05 MED ORDER — ONDANSETRON HCL 4 MG PO TABS: 4.0000 mg | ORAL_TABLET | Freq: Three times a day (TID) | ORAL | 0 refills | Status: DC | PRN

## 2016-03-05 MED ORDER — OSELTAMIVIR PHOSPHATE 75 MG PO CAPS: 75.0000 mg | ORAL_CAPSULE | Freq: Every day | ORAL | Status: DC

## 2016-03-05 MED ORDER — OSELTAMIVIR PHOSPHATE 75 MG PO CAPS
75.0000 mg | ORAL_CAPSULE | Freq: Every day | ORAL | Status: DC
  Administered 2016-03-05: 75 mg via ORAL

## 2016-03-05 MED ORDER — LACTATED RINGERS IV BOLUS (SEPSIS)
1000.0000 mL | Freq: Once | INTRAVENOUS | Status: AC
  Administered 2016-03-05 (×2): 1000 mL via INTRAVENOUS

## 2016-03-05 MED ORDER — OSELTAMIVIR PHOSPHATE 75 MG PO CAPS: 75.0000 mg | ORAL_CAPSULE | Freq: Two times a day (BID) | ORAL | 0 refills | Status: AC

## 2016-03-05 MED ORDER — OSELTAMIVIR PHOSPHATE 75 MG PO CAPS
75.0000 mg | ORAL_CAPSULE | Freq: Two times a day (BID) | ORAL | Status: DC
  Administered 2016-03-05: 75 mg via ORAL
  Filled 2016-03-05: qty 1

## 2016-03-05 MED ORDER — ONDANSETRON HCL 4 MG/2ML IJ SOLN
4.0000 mg | Freq: Once | INTRAMUSCULAR | Status: AC
  Administered 2016-03-05: 4 mg via INTRAVENOUS
  Filled 2016-03-05: qty 2

## 2016-03-05 MED ORDER — ACETAMINOPHEN 325 MG PO TABS
650.0000 mg | ORAL_TABLET | Freq: Four times a day (QID) | ORAL | Status: DC | PRN
  Administered 2016-03-05: 650 mg via ORAL
  Filled 2016-03-05: qty 2

## 2016-03-05 MED ORDER — LACTATED RINGERS IV BOLUS (SEPSIS)
1000.0000 mL | Freq: Once | INTRAVENOUS | Status: AC
  Administered 2016-03-05: 1000 mL via INTRAVENOUS

## 2016-03-05 NOTE — Telephone Encounter (Signed)
Patient requested call back regarding wanting something for pain. Per chart review, patient was diagnosed with the flu this morning. Called patient and she states she is having pain in her legs that won't go away. Asked patient if she has taken anything. Patient states tylenol 10 minutes ago. Told patient to give it more time to kick in. Also recommended warm bath and heating bad. Told patient part of the flu is body aches/pains and there isn't anything we can really do for that other than the tamiflu that she has been given. Patient verbalized understanding and had no questions

## 2016-03-05 NOTE — MAU Provider Note (Signed)
MAU PROVIDER NOTE  Chief Complaint:  No chief complaint on file.  HPI: Deborah Steele is a 20 y.o. G1P0 at [redacted]w[redacted]d who presents to maternity admissions reporting cough, fever and congestion.  States her symptoms began on Monday with body aches, feeling cold.  Worsened today and had a fever.  Took Tylenol around 8.30PM 500 mg. Feels nauseous and has vomited.  Denies blood in vomit.  Denies diarrhea, abdominal pain.   Husband recently went to ER (Friday) and diagnosed with pneumonia, on antibiotics.  Reports no pregnancy related symptoms.  Denies contractions, LOF, vaginal bleeding.  Good fetal movement.  Has had the flu shot this year.   Pregnancy Course:  None significant   Past Medical History: Past Medical History:  Diagnosis Date  . Medical history non-contributory    Past obstetric history: OB History  Gravida Para Term Preterm AB Living  1         0  SAB TAB Ectopic Multiple Live Births               # Outcome Date GA Lbr Len/2nd Weight Sex Delivery Anes PTL Lv  1 Current              Past Surgical History: Past Surgical History:  Procedure Laterality Date  . NO PAST SURGERIES     Family History: Family History  Problem Relation Age of Onset  . Hyperlipidemia Mother   . Hypertension Mother    Social History: Social History  Substance Use Topics  . Smoking status: Never Smoker  . Smokeless tobacco: Never Used  . Alcohol use No   Allergies: No Known Allergies  Meds:  Prescriptions Prior to Admission  Medication Sig Dispense Refill Last Dose  . acetaminophen (TYLENOL) 500 MG tablet Take 500 mg by mouth every 6 (six) hours as needed.   03/04/2016 at 2030  . metroNIDAZOLE (FLAGYL) 500 MG tablet Take 1 tablet (500 mg total) by mouth 2 (two) times daily. 14 tablet 0 Past Week at Unknown time  . Prenatal Vit-Fe Fumarate-FA (PRENATAL MULTIVITAMIN) TABS tablet Take 1 tablet by mouth daily at 12 noon.   Past Week at Unknown time  . glycopyrrolate (ROBINUL) 2 MG tablet Take 1  tablet (2 mg total) by mouth 3 (three) times daily as needed. (Patient not taking: Reported on 02/19/2016) 30 tablet 3 Not Taking  . polyethylene glycol powder (GLYCOLAX/MIRALAX) powder Take 17 g by mouth daily as needed for moderate constipation. (Patient not taking: Reported on 02/19/2016) 500 g 2 Not Taking   I have reviewed patient's Past Medical Hx, Surgical Hx, Family Hx, Social Hx, medications and allergies.   ROS:  A comprehensive ROS was negative except per HPI.   Physical Exam   Patient Vitals for the past 24 hrs:  BP Temp Temp src Pulse Resp SpO2 Height Weight  03/05/16 0847 111/59 99.6 F (37.6 C) Oral (!) 123 20 - - -  03/05/16 0655 121/73 98.8 F (37.1 C) Oral 120 20 98 % - -  03/05/16 0610 - 99.5 F (37.5 C) - - - - - -  03/05/16 0500 - - - (!) 145 - 100 % - -  03/05/16 0436 148/81 100.8 F (38.2 C) Oral (!) 155 20 98 % 5\' 2"  (1.575 m) 198 lb (89.8 kg)   Constitutional: Well-developed, well-nourished female in no acute distress.  Cardiovascular: tachy to 160-170, no MRG Respiratory: normal effort on RA, RLL rhonchi GI: Abd soft, non-tender, gravid appropriate for gestational age. Pos BS  x 4 MS: Extremities nontender, no edema, normal ROM Neurologic: Alert and oriented x 4.  GU: Neg CVAT.  Pelvic: NEFG, physiologic discharge, no blood, cervix clean. No CMT    FHT:  Baseline 170s , moderate variability, accelerations present, no decelerations Contractions: NA   Labs: Results for orders placed or performed during the hospital encounter of 03/05/16 (from the past 24 hour(s))  Urinalysis, Routine w reflex microscopic     Status: Abnormal   Collection Time: 03/05/16  4:35 AM  Result Value Ref Range   Color, Urine STRAW (A) YELLOW   APPearance CLEAR CLEAR   Specific Gravity, Urine 1.004 (L) 1.005 - 1.030   pH 8.0 5.0 - 8.0   Glucose, UA NEGATIVE NEGATIVE mg/dL   Hgb urine dipstick NEGATIVE NEGATIVE   Bilirubin Urine NEGATIVE NEGATIVE   Ketones, ur NEGATIVE  NEGATIVE mg/dL   Protein, ur NEGATIVE NEGATIVE mg/dL   Nitrite NEGATIVE NEGATIVE   Leukocytes, UA NEGATIVE NEGATIVE  Comprehensive metabolic panel     Status: Abnormal   Collection Time: 03/05/16  5:15 AM  Result Value Ref Range   Sodium 134 (L) 135 - 145 mmol/L   Potassium 3.6 3.5 - 5.1 mmol/L   Chloride 104 101 - 111 mmol/L   CO2 21 (L) 22 - 32 mmol/L   Glucose, Bld 96 65 - 99 mg/dL   BUN 5 (L) 6 - 20 mg/dL   Creatinine, Ser 1.610.57 0.44 - 1.00 mg/dL   Calcium 9.0 8.9 - 09.610.3 mg/dL   Total Protein 6.6 6.5 - 8.1 g/dL   Albumin 3.1 (L) 3.5 - 5.0 g/dL   AST 22 15 - 41 U/L   ALT 24 14 - 54 U/L   Alkaline Phosphatase 105 38 - 126 U/L   Total Bilirubin 0.4 0.3 - 1.2 mg/dL   GFR calc non Af Amer >60 >60 mL/min   GFR calc Af Amer >60 >60 mL/min   Anion gap 9 5 - 15  CBC     Status: Abnormal   Collection Time: 03/05/16  5:15 AM  Result Value Ref Range   WBC 10.7 (H) 4.0 - 10.5 K/uL   RBC 4.20 3.87 - 5.11 MIL/uL   Hemoglobin 12.2 12.0 - 15.0 g/dL   HCT 04.535.3 (L) 40.936.0 - 81.146.0 %   MCV 84.0 78.0 - 100.0 fL   MCH 29.0 26.0 - 34.0 pg   MCHC 34.6 30.0 - 36.0 g/dL   RDW 91.414.1 78.211.5 - 95.615.5 %   Platelets 263 150 - 400 K/uL  Lactic acid, plasma     Status: None   Collection Time: 03/05/16  5:15 AM  Result Value Ref Range   Lactic Acid, Venous 1.1 0.5 - 1.9 mmol/L  Influenza panel by PCR (type A & B, H1N1)     Status: Abnormal   Collection Time: 03/05/16  5:20 AM  Result Value Ref Range   Influenza A By PCR POSITIVE (A) NEGATIVE   Influenza B By PCR NEGATIVE NEGATIVE   Imaging:  Dg Chest 2 View  Result Date: 03/05/2016 CLINICAL DATA:  20 year old pregnant female with cough and chest pain. EXAM: CHEST  2 VIEW COMPARISON:  None. FINDINGS: The heart size and mediastinal contours are within normal limits. Both lungs are clear. The visualized skeletal structures are unremarkable. IMPRESSION: No active cardiopulmonary disease. Electronically Signed   By: Elgie CollardArash  Radparvar M.D.   On: 03/05/2016 06:48     MAU Course: CBC, CMP CXR Lactic acid Blood cx x 2 1 L bolus  LR x 2 Tylenol 650 mg IV Zofran 4 mg UA Flu swab Tamiflu  MDM:  Plan of care reviewed with patient, including labs and tests ordered and medical treatment.  Assessment: 1. Fever    Plan:  Patient febrile upon presentation to MAU with T 100.8.  White count mildly elevated to 10.7.  Sepsis protocol initiated. Blood cultures collected, 1L bolus LR given. Flu swab revealed Influenza A positive.  UA negative. Labs otherwise wnl. Tylenol given for fever and body aches.   Repeated 1L LR bolus. Temperature resolved to 99.5 and patient reports feeling better with Tylenol and Zofran.   CXR unremarkable.   First dose Tamiflu given in MAU. Discharged home in stable condition with Tamiflu and Zofran.   Return precautions given.       Medication List    TAKE these medications   acetaminophen 500 MG tablet Commonly known as:  TYLENOL Take 500 mg by mouth every 6 (six) hours as needed.   glycopyrrolate 2 MG tablet Commonly known as:  ROBINUL Take 1 tablet (2 mg total) by mouth 3 (three) times daily as needed.   metroNIDAZOLE 500 MG tablet Commonly known as:  FLAGYL Take 1 tablet (500 mg total) by mouth 2 (two) times daily.   ondansetron 4 MG tablet Commonly known as:  ZOFRAN Take 1 tablet (4 mg total) by mouth every 8 (eight) hours as needed for nausea or vomiting.   oseltamivir 75 MG capsule Commonly known as:  TAMIFLU Take 1 capsule (75 mg total) by mouth 2 (two) times daily.   polyethylene glycol powder powder Commonly known as:  GLYCOLAX/MIRALAX Take 17 g by mouth daily as needed for moderate constipation.   prenatal multivitamin Tabs tablet Take 1 tablet by mouth daily at 12 noon.       Freddrick MarchYashika Amin, MD PGY-1 03/05/2016 9:03 AM  OB FELLOW DISCHARGE ATTESTATION  I have seen and examined this patient and agree with above documentation in the resident's note.   Ernestina PennaNicholas Schenk, MD 11:53 AM

## 2016-03-05 NOTE — Discharge Instructions (Signed)

## 2016-03-05 NOTE — MAU Note (Signed)
PT  SAYS SHE STARTED  COUGHING  AND HAD RUNNY NOSE ON  Monday.    HUSBAND  WENT  TO ER - DX WITH  PNEUMONIA  - ON ANTX   THEN    PT  SAYS  AT 0100   SHE  STARTED  FEELING  WORSE.     HAS  VOMITED X1  AT HOME .    SAYS  COUGH HAS  MUCUS.      BODY  ACHES   TOOK TYLENOL  AT 830PM    500 MG   1 TAB

## 2016-03-05 NOTE — MAU Note (Signed)
Pt reports cough, fever, body aches, headache, chills.

## 2016-03-10 ENCOUNTER — Encounter: Payer: Self-pay | Admitting: *Deleted

## 2016-03-10 LAB — CULTURE, BLOOD (ROUTINE X 2)
CULTURE: NO GROWTH
Culture: NO GROWTH

## 2016-03-10 NOTE — Progress Notes (Signed)
Deborah Steele came to office window stating she called call- a -nurse and they told her to come to office.  She is [redacted] weeks pregnant.c/o had flu and is on last dose of tamiflu but doesn't feel any better. C/o still  Has runny nose, blowing her nose a lot and sees alittle blood in mucous sometimes, had a sharp pain in her right side when she coughs a lot-otherwise like now is achy. States she feels the baby move well, denies any contractions or vaginal bleeding. Discussed her complaints with Dr. Adrian BlackwaterStinson. Advised her flu takes a while to run its course- can treat her symptoms with robitussin, mucinex, delsym. If pain worsens or doesn't get better after cough under control go to MAU for evaluation. She voices understanding.

## 2016-03-11 ENCOUNTER — Inpatient Hospital Stay (HOSPITAL_COMMUNITY): Payer: Medicaid Other

## 2016-03-11 ENCOUNTER — Inpatient Hospital Stay (HOSPITAL_COMMUNITY)
Admission: AD | Admit: 2016-03-11 | Discharge: 2016-03-11 | Disposition: A | Discharge status: Home / Self Care | Payer: Medicaid Other | Source: Ambulatory Visit | Attending: Obstetrics and Gynecology | Admitting: Obstetrics and Gynecology

## 2016-03-11 ENCOUNTER — Other Ambulatory Visit: Payer: Self-pay | Admitting: Advanced Practice Midwife

## 2016-03-11 ENCOUNTER — Encounter (HOSPITAL_COMMUNITY): Payer: Self-pay | Admitting: *Deleted

## 2016-03-11 DIAGNOSIS — J101 Influenza due to other identified influenza virus with other respiratory manifestations: Secondary | ICD-10-CM

## 2016-03-11 DIAGNOSIS — O9952 Diseases of the respiratory system complicating childbirth: Secondary | ICD-10-CM | POA: Diagnosis not present

## 2016-03-11 DIAGNOSIS — O99513 Diseases of the respiratory system complicating pregnancy, third trimester: Secondary | ICD-10-CM | POA: Diagnosis not present

## 2016-03-11 DIAGNOSIS — Z3A32 32 weeks gestation of pregnancy: Secondary | ICD-10-CM | POA: Diagnosis not present

## 2016-03-11 DIAGNOSIS — R05 Cough: Secondary | ICD-10-CM | POA: Insufficient documentation

## 2016-03-11 DIAGNOSIS — M545 Low back pain: Secondary | ICD-10-CM | POA: Insufficient documentation

## 2016-03-11 DIAGNOSIS — R058 Other specified cough: Secondary | ICD-10-CM

## 2016-03-11 HISTORY — DX: Influenza due to unidentified influenza virus with other respiratory manifestations: J11.1

## 2016-03-11 LAB — URINALYSIS, ROUTINE W REFLEX MICROSCOPIC
Bilirubin Urine: NEGATIVE
GLUCOSE, UA: NEGATIVE mg/dL
HGB URINE DIPSTICK: NEGATIVE
Ketones, ur: 15 mg/dL — AB
LEUKOCYTES UA: NEGATIVE
Nitrite: NEGATIVE
PH: 6.5 (ref 5.0–8.0)
Protein, ur: NEGATIVE mg/dL
Specific Gravity, Urine: 1.015 (ref 1.005–1.030)

## 2016-03-11 LAB — CBC WITH DIFFERENTIAL/PLATELET
Basophils Absolute: 0 10*3/uL (ref 0.0–0.1)
Basophils Relative: 0 %
EOS PCT: 0 %
Eosinophils Absolute: 0 10*3/uL (ref 0.0–0.7)
HEMATOCRIT: 34.2 % — AB (ref 36.0–46.0)
Hemoglobin: 11.9 g/dL — ABNORMAL LOW (ref 12.0–15.0)
LYMPHS PCT: 20 %
Lymphs Abs: 2.1 10*3/uL (ref 0.7–4.0)
MCH: 28.9 pg (ref 26.0–34.0)
MCHC: 34.8 g/dL (ref 30.0–36.0)
MCV: 83 fL (ref 78.0–100.0)
MONO ABS: 0.4 10*3/uL (ref 0.1–1.0)
MONOS PCT: 4 %
NEUTROS ABS: 7.7 10*3/uL (ref 1.7–7.7)
Neutrophils Relative %: 76 %
PLATELETS: 345 10*3/uL (ref 150–400)
RBC: 4.12 MIL/uL (ref 3.87–5.11)
RDW: 13.5 % (ref 11.5–15.5)
WBC: 10.2 10*3/uL (ref 4.0–10.5)

## 2016-03-11 MED ORDER — HYDROCOD POLST-CPM POLST ER 10-8 MG/5ML PO SUER: 5.0000 mL | Freq: Two times a day (BID) | ORAL | 0 refills | Status: DC | PRN

## 2016-03-11 MED ORDER — PROMETHAZINE-CODEINE 6.25-10 MG/5ML PO SYRP: 10.0000 mL | ORAL_SOLUTION | Freq: Once | ORAL | Status: DC

## 2016-03-11 MED ORDER — HYDROCOD POLST-CPM POLST ER 10-8 MG/5ML PO SUER
5.0000 mL | Freq: Once | ORAL | Status: AC
  Administered 2016-03-11: 5 mL via ORAL
  Filled 2016-03-11: qty 5

## 2016-03-11 MED ORDER — GUAIFENESIN ER 600 MG PO TB12: 1200.0000 mg | ORAL_TABLET | Freq: Two times a day (BID) | ORAL | 1 refills | Status: DC

## 2016-03-11 MED ORDER — PROMETHAZINE-CODEINE 6.25-10 MG/5ML PO SYRP: 5.0000 mL | ORAL_SOLUTION | Freq: Four times a day (QID) | ORAL | 0 refills | Status: DC | PRN

## 2016-03-11 NOTE — Progress Notes (Signed)
Tussionex not covered by Medicaid. Will Rx Codeine w/ phenergan. Pt will come pick up Rx.

## 2016-03-11 NOTE — Discharge Instructions (Signed)
Cough, Adult Coughing is a reflex that clears your throat and your airways. Coughing helps to heal and protect your lungs. It is normal to cough occasionally, but a cough that happens with other symptoms or lasts a long time may be a sign of a condition that needs treatment. A cough may last only 2-3 weeks (acute), or it may last longer than 8 weeks (chronic). What are the causes? Coughing is commonly caused by:  Breathing in substances that irritate your lungs.  A viral or bacterial respiratory infection.  Allergies.  Asthma.  Postnasal drip.  Smoking.  Acid backing up from the stomach into the esophagus (gastroesophageal reflux).  Certain medicines.  Chronic lung problems, including COPD (or rarely, lung cancer).  Other medical conditions such as heart failure.  Follow these instructions at home: Pay attention to any changes in your symptoms. Take these actions to help with your discomfort:  Take medicines only as told by your health care provider. ? If you were prescribed an antibiotic medicine, take it as told by your health care provider. Do not stop taking the antibiotic even if you start to feel better. ? Talk with your health care provider before you take a cough suppressant medicine.  Drink enough fluid to keep your urine clear or pale yellow.  If the air is dry, use a cold steam vaporizer or humidifier in your bedroom or your home to help loosen secretions.  Avoid anything that causes you to cough at work or at home.  If your cough is worse at night, try sleeping in a semi-upright position.  Avoid cigarette smoke. If you smoke, quit smoking. If you need help quitting, ask your health care provider.  Avoid caffeine.  Avoid alcohol.  Rest as needed.  Contact a health care provider if:  You have new symptoms.  You cough up pus.  Your cough does not get better after 2-3 weeks, or your cough gets worse.  You cannot control your cough with suppressant  medicines and you are losing sleep.  You develop pain that is getting worse or pain that is not controlled with pain medicines.  You have a fever.  You have unexplained weight loss.  You have night sweats. Get help right away if:  You cough up blood.  You have difficulty breathing.  Your heartbeat is very fast. This information is not intended to replace advice given to you by your health care provider. Make sure you discuss any questions you have with your health care provider. Document Released: 09/06/2010 Document Revised: 08/16/2015 Document Reviewed: 05/17/2014 Elsevier Interactive Patient Education  2017 Else[employer] Inc.  

## 2016-03-11 NOTE — Progress Notes (Signed)
Mitchel HonourVirginian Smith CNM in to discuss test results and d/c plan. Written and verbal d/c instructions given and understanding voiced

## 2016-03-11 NOTE — MAU Note (Addendum)
Pt tested pos for flu on 12/13, pt here today for persistent cough, productive with green mucus,  & is now having R mid back/rib pain.  Back aches all the time, has intense pain with coughing. Denies abd pain, bleeding or LOF.  Denies fever.

## 2016-03-11 NOTE — Progress Notes (Signed)
1404 V. Smith CNM in with pt and sat pt up. OK to d/c EFM. PT going to XRAY

## 2016-03-11 NOTE — MAU Provider Note (Signed)
Chief Complaint:  Cough and Back Pain   First Provider Initiated Contact with Patient 03/11/16 1356     HPI: Deborah Steele is a 20 y.o. G1P0 at [redacted]w[redacted]d who presents to maternity admissions reporting worsening cough and right rib pain over the past fwe days. Was Dx'd w/ Influenza A on 03/05/16. Completed Tamiflu as directed. Body aches and fever resolved, but cough worsened. .  Location: right rib Quality: sharp Severity: 9/10 in pain scale Duration: several Context: Recent Flu Dx, Tx Timing: Intermittent Modifying factors: Worse w/ coughing, mvmt Associated signs and symptoms: Pos for productive cough.   Denies contractions, leakage of fluid or vaginal bleeding. Good fetal movement. Negative for fever, chills, shortness of breath, body aches, night sweats or fatigue. Had one episode of scant bloody sputum in MAU tonight after prolonged coughing episode.   Past Medical History:  Diagnosis Date  . Influenza 02/2016  . Medical history non-contributory    OB History  Gravida Para Term Preterm AB Living  1         0  SAB TAB Ectopic Multiple Live Births               # Outcome Date GA Lbr Len/2nd Weight Sex Delivery Anes PTL Lv  1 Current              Past Surgical History:  Procedure Laterality Date  . NO PAST SURGERIES     Family History  Problem Relation Age of Onset  . Hyperlipidemia Mother   . Hypertension Mother    Social History  Substance Use Topics  . Smoking status: Never Smoker  . Smokeless tobacco: Never Used  . Alcohol use No   No Known Allergies Prescriptions Prior to Admission  Medication Sig Dispense Refill Last Dose  . Prenatal Vit-Fe Fumarate-FA (PRENATAL MULTIVITAMIN) TABS tablet Take 1 tablet by mouth daily at 12 noon.   03/10/2016 at Unknown time  . glycopyrrolate (ROBINUL) 2 MG tablet Take 1 tablet (2 mg total) by mouth 3 (three) times daily as needed. (Patient not taking: Reported on 03/11/2016) 30 tablet 3 Not Taking at Unknown time  . metroNIDAZOLE  (FLAGYL) 500 MG tablet Take 1 tablet (500 mg total) by mouth 2 (two) times daily. (Patient not taking: Reported on 03/11/2016) 14 tablet 0 Completed Course at Unknown time  . ondansetron (ZOFRAN) 4 MG tablet Take 1 tablet (4 mg total) by mouth every 8 (eight) hours as needed for nausea or vomiting. (Patient not taking: Reported on 03/11/2016) 20 tablet 0 Not Taking at Unknown time  . polyethylene glycol powder (GLYCOLAX/MIRALAX) powder Take 17 g by mouth daily as needed for moderate constipation. (Patient not taking: Reported on 03/11/2016) 500 g 2 Not Taking at Unknown time    I have reviewed patient's Past Medical Hx, Surgical Hx, Family Hx, Social Hx, medications and allergies.   ROS:  Review of Systems  Constitutional: Negative for chills, diaphoresis, fatigue and fever.  HENT: Positive for congestion, rhinorrhea and sore throat. Negative for sinus pain and sinus pressure.   Respiratory: Positive for cough. Negative for chest tightness, shortness of breath, wheezing and stridor.   Cardiovascular: Negative for chest pain.  Gastrointestinal: Negative for abdominal pain.  Genitourinary: Positive for flank pain. Negative for dysuria, frequency, hematuria, urgency and vaginal bleeding.  Musculoskeletal: Negative for myalgias.  Neurological: Negative for weakness.    Physical Exam  Patient Vitals for the past 24 hrs:  BP Pulse Resp SpO2  03/11/16 1310 131/80 97 18 97 %  Constitutional: Well-developed, well-nourished female in Mild distress.  Cardiovascular: normal rate, no murmurs, rubs or gallops Respiratory: normal effort, clear to auscultation bilaterally. No egophony. Frequent productive coughing. One episode with scant blood after prolonged coughing.  GI: Abd soft, non-tender, gravid appropriate for gestational age.  MS: Right upper side tender to palpation. Neurologic: Alert and oriented x 4.  GU: Neg CVAT.   FHT:  Baseline 140 , moderate variability, accelerations present, no  decelerations Contractions: None   Labs: Results for orders placed or performed during the hospital encounter of 03/11/16 (from the past 24 hour(s))  Urinalysis, Routine w reflex microscopic     Status: Abnormal   Collection Time: 03/11/16  1:30 PM  Result Value Ref Range   Color, Urine YELLOW YELLOW   APPearance CLEAR CLEAR   Specific Gravity, Urine 1.015 1.005 - 1.030   pH 6.5 5.0 - 8.0   Glucose, UA NEGATIVE NEGATIVE mg/dL   Hgb urine dipstick NEGATIVE NEGATIVE   Bilirubin Urine NEGATIVE NEGATIVE   Ketones, ur 15 (A) NEGATIVE mg/dL   Protein, ur NEGATIVE NEGATIVE mg/dL   Nitrite NEGATIVE NEGATIVE   Leukocytes, UA NEGATIVE NEGATIVE  CBC with Differential/Platelet     Status: Abnormal   Collection Time: 03/11/16  1:49 PM  Result Value Ref Range   WBC 10.2 4.0 - 10.5 K/uL   RBC 4.12 3.87 - 5.11 MIL/uL   Hemoglobin 11.9 (L) 12.0 - 15.0 g/dL   HCT 16.134.2 (L) 09.636.0 - 04.546.0 %   MCV 83.0 78.0 - 100.0 fL   MCH 28.9 26.0 - 34.0 pg   MCHC 34.8 30.0 - 36.0 g/dL   RDW 40.913.5 81.111.5 - 91.415.5 %   Platelets 345 150 - 400 K/uL   Neutrophils Relative % 76 %   Neutro Abs 7.7 1.7 - 7.7 K/uL   Lymphocytes Relative 20 %   Lymphs Abs 2.1 0.7 - 4.0 K/uL   Monocytes Relative 4 %   Monocytes Absolute 0.4 0.1 - 1.0 K/uL   Eosinophils Relative 0 %   Eosinophils Absolute 0.0 0.0 - 0.7 K/uL   Basophils Relative 0 %   Basophils Absolute 0.0 0.0 - 0.1 K/uL    Imaging:  Dg Chest 2 View  Result Date: 03/11/2016 CLINICAL DATA:  Persistent cough. EXAM: CHEST  2 VIEW COMPARISON:  03/05/2016 FINDINGS: The heart size and mediastinal contours are within normal limits. Both lungs are clear. The visualized skeletal structures are unremarkable. IMPRESSION: No active cardiopulmonary disease. Electronically Signed   By: Signa Kellaylor  Stroud M.D.   On: 03/11/2016 14:49   Dg Chest 2 View  Result Date: 03/05/2016 CLINICAL DATA:  20 year old pregnant female with cough and chest pain. EXAM: CHEST  2 VIEW COMPARISON:  None.  FINDINGS: The heart size and mediastinal contours are within normal limits. Both lungs are clear. The visualized skeletal structures are unremarkable. IMPRESSION: No active cardiopulmonary disease. Electronically Signed   By: Elgie CollardArash  Radparvar M.D.   On: 03/05/2016 06:48    MAU Course: Orders Placed This Encounter  Procedures  . DG Chest 2 View  . Urinalysis, Routine w reflex microscopic  . CBC with Differential/Platelet  . Discharge patient   Meds ordered this encounter  Medications  . DISCONTD: promethazine-codeine (PHENERGAN with CODEINE) 6.25-10 MG/5ML syrup 10 mL  . chlorpheniramine-HYDROcodone (TUSSIONEX) 10-8 MG/5ML suspension 5 mL  . guaiFENesin (MUCINEX) 600 MG 12 hr tablet    Sig: Take 2 tablets (1,200 mg total) by mouth 2 (two) times daily.    Dispense:  30  tablet    Refill:  1    Order Specific Question:   Supervising Provider    Answer:   Reva BoresPRATT, TANYA S [[id number]]  . chlorpheniramine-HYDROcodone (TUSSIONEX PENNKINETIC ER) 10-8 MG/5ML SUER    Sig: Take 5 mLs by mouth every 12 (twelve) hours as needed for cough.    Dispense:  140 mL    Refill:  0    Order Specific Question:   Supervising Provider    Answer:   Reva BoresPRATT, TANYA S [[id number]]   Cough and pain improved with tussionex.   MDM: - Status post influenza with right rib pain of musculoskeletal etiology. No evidence of pneumonia or other emergent condition.  - Discussed that it can take many days influenza symptoms to completely resolve and sometimes weeks for the cough to resolve.  Assessment: 1. Disease of respiratory system affecting pregnancy in third trimester   2. Cough productive of purulent sputum   3. Influenza A virus present     Plan: Discharge home in stable condition.  Preterm Labor precautions and fetal kick counts  Recommended testing except night and Mucinex twice a day. Push fluids.  Follow-up Information    [facility]   Specialty:  Obstetrics and  Gynecology Why:  for routine prenatal visit.  Contact information: [address] [place] [phone]       [facility] Follow up.   Why:  as needed in emergencies Contact information: [address] [id number] [place] [phone]          Allergies as of 03/11/2016   No Known Allergies     Medication List    STOP taking these medications   glycopyrrolate 2 MG tablet Commonly known as:  ROBINUL   metroNIDAZOLE 500 MG tablet Commonly known as:  FLAGYL     TAKE these medications   chlorpheniramine-HYDROcodone 10-8 MG/5ML Suer Commonly known as:  TUSSIONEX PENNKINETIC ER Take 5 mLs by mouth every 12 (twelve) hours as needed for cough.   guaiFENesin 600 MG 12 hr tablet Commonly known as:  MUCINEX Take 2 tablets (1,200 mg total) by mouth 2 (two) times daily.   ondansetron 4 MG tablet Commonly known as:  ZOFRAN Take 1 tablet (4 mg total) by mouth every 8 (eight) hours as needed for nausea or vomiting.   polyethylene glycol powder powder Commonly known as:  GLYCOLAX/MIRALAX Take 17 g by mouth daily as needed for moderate constipation.   prenatal multivitamin Tabs tablet Take 1 tablet by mouth daily at 12 noon.       [place] Destyni Hoppel, CNM 03/11/2016 3:16 PM

## 2016-03-15 ENCOUNTER — Encounter (HOSPITAL_COMMUNITY): Payer: Self-pay

## 2016-03-15 ENCOUNTER — Inpatient Hospital Stay (HOSPITAL_COMMUNITY)
Admission: AD | Admit: 2016-03-15 | Discharge: 2016-03-16 | Disposition: A | Discharge status: Home / Self Care | Payer: Medicaid Other | Source: Ambulatory Visit | Attending: Obstetrics & Gynecology | Admitting: Obstetrics & Gynecology

## 2016-03-15 DIAGNOSIS — O26893 Other specified pregnancy related conditions, third trimester: Secondary | ICD-10-CM | POA: Insufficient documentation

## 2016-03-15 DIAGNOSIS — O99891 Other specified diseases and conditions complicating pregnancy: Secondary | ICD-10-CM

## 2016-03-15 DIAGNOSIS — Z3A32 32 weeks gestation of pregnancy: Secondary | ICD-10-CM | POA: Insufficient documentation

## 2016-03-15 DIAGNOSIS — Z8249 Family history of ischemic heart disease and other diseases of the circulatory system: Secondary | ICD-10-CM | POA: Insufficient documentation

## 2016-03-15 DIAGNOSIS — O26899 Other specified pregnancy related conditions, unspecified trimester: Secondary | ICD-10-CM | POA: Diagnosis not present

## 2016-03-15 DIAGNOSIS — O9989 Other specified diseases and conditions complicating pregnancy, childbirth and the puerperium: Secondary | ICD-10-CM

## 2016-03-15 DIAGNOSIS — M549 Dorsalgia, unspecified: Secondary | ICD-10-CM | POA: Insufficient documentation

## 2016-03-15 LAB — URINALYSIS, ROUTINE W REFLEX MICROSCOPIC
BILIRUBIN URINE: NEGATIVE
GLUCOSE, UA: NEGATIVE mg/dL
HGB URINE DIPSTICK: NEGATIVE
Ketones, ur: NEGATIVE mg/dL
Leukocytes, UA: NEGATIVE
Nitrite: NEGATIVE
PROTEIN: NEGATIVE mg/dL
Specific Gravity, Urine: 1.021 (ref 1.005–1.030)
pH: 5 (ref 5.0–8.0)

## 2016-03-15 NOTE — Progress Notes (Signed)
Movement and coughing makes it worse

## 2016-03-15 NOTE — MAU Note (Signed)
Have been having back pain R side and was seen last wk. Getting better. Today was walking toward door and reached for handle and R back "popped" and now I am miserable. Denies LOF or bleeding

## 2016-03-16 DIAGNOSIS — M549 Dorsalgia, unspecified: Secondary | ICD-10-CM

## 2016-03-16 DIAGNOSIS — O26899 Other specified pregnancy related conditions, unspecified trimester: Secondary | ICD-10-CM

## 2016-03-16 MED ORDER — CYCLOBENZAPRINE HCL 10 MG PO TABS
10.0000 mg | ORAL_TABLET | Freq: Once | ORAL | Status: AC
  Administered 2016-03-16: 10 mg via ORAL
  Filled 2016-03-16: qty 1

## 2016-03-16 MED ORDER — CYCLOBENZAPRINE HCL 10 MG PO TABS: 10.0000 mg | ORAL_TABLET | Freq: Every day | ORAL | 0 refills | Status: DC

## 2016-03-16 NOTE — Discharge Instructions (Signed)
Back Pain in Pregnancy Introduction Back pain during pregnancy is common. Back pain may be caused by several factors that are related to changes during your pregnancy. Follow these instructions at home: Managing pain, stiffness, and swelling  If directed, apply ice for sudden (acute) back pain.  Put ice in a plastic bag.  Place a towel between your skin and the bag.  Leave the ice on for 20 minutes, 2-3 times per day.  If directed, apply heat to the affected area before you exercise:  Place a towel between your skin and the heat pack or heating pad.  Leave the heat on for 20-30 minutes.  Remove the heat if your skin turns bright red. This is especially important if you are unable to feel pain, heat, or cold. You may have a greater risk of getting burned. Activity  Exercise as told by your health care provider. Exercising is the best way to prevent or manage back pain.  Listen to your body when lifting. If lifting hurts, ask for help or bend your knees. This uses your leg muscles instead of your back muscles.  Squat down when picking up something from the floor. Do not bend over.  Only use bed rest as told by your health care provider. Bed rest should only be used for the most severe episodes of back pain. Standing, Sitting, and Lying Down  Do not stand in one place for long periods of time.  Use good posture when sitting. Make sure your head rests over your shoulders and is not hanging forward. Use a pillow on your lower back if necessary.  Try sleeping on your side, preferably the left side, with a pillow or two between your legs. If you are sore after a night's rest, your bed may be too soft. A firm mattress may provide more support for your back during pregnancy. General instructions  Do not wear high heels.  Eat a healthy diet. Try to gain weight within your health care provider's recommendations.  Use a maternity girdle, elastic sling, or back brace as told by your  health care provider.  Take over-the-counter and prescription medicines only as told by your health care provider.  Keep all follow-up visits as told by your health care provider. This is important. This includes any visits with any specialists, such as a physical therapist. Contact a health care provider if:  Your back pain interferes with your daily activities.  You have increasing pain in other parts of your body. Get help right away if:  You develop numbness, tingling, weakness, or problems with the use of your arms or legs.  You develop severe back pain that is not controlled with medicine.  You have a sudden change in bowel or bladder control.  You develop shortness of breath, dizziness, or you faint.  You develop nausea, vomiting, or sweating.  You have back pain that is a rhythmic, cramping pain similar to labor pains. Labor pain is usually 1-2 minutes apart, lasts for about 1 minute, and involves a bearing down feeling or pressure in your pelvis.  You have back pain and your water breaks or you have vaginal bleeding.  You have back pain or numbness that travels down your leg.  Your back pain developed after you fell.  You develop pain on one side of your back.  You see blood in your urine.  You develop skin blisters in the area of your back pain. This information is not intended to replace advice given to   you by your health care provider. Make sure you discuss any questions you have with your health care provider. Document Released: 06/18/2005 Document Revised: 08/16/2015 Document Reviewed: 11/22/2014  2017 Elsevier  

## 2016-03-16 NOTE — Progress Notes (Signed)
Computer signature for d/c teaching not working, see paper copy.

## 2016-03-16 NOTE — MAU Provider Note (Signed)
History     CSN: [id number]  Arrival date and time: 03/15/16 2257   First Provider Initiated Contact with Patient 03/15/16 2347      Chief Complaint  Patient presents with  . Back Pain   Patient is a 20 y/o G1P0 at 32+3 who presents with complaint of cough and back pain. She was seen here 4 days prior and prescribed codeine w/ phenergen. Today, she reached out to open the door and felt a "pop" in her back. Denies vaginal bleeding, LOF, contractions. Reports good fetal movement.     OB History    Gravida Para Term Preterm AB Living   1         0   SAB TAB Ectopic Multiple Live Births                  Past Medical History:  Diagnosis Date  . Influenza 02/2016  . Medical history non-contributory     Past Surgical History:  Procedure Laterality Date  . NO PAST SURGERIES      Family History  Problem Relation Age of Onset  . Hyperlipidemia Mother   . Hypertension Mother     Social History  Substance Use Topics  . Smoking status: Never Smoker  . Smokeless tobacco: Never Used  . Alcohol use No    Allergies: No Known Allergies  Prescriptions Prior to Admission  Medication Sig Dispense Refill Last Dose  . guaiFENesin (MUCINEX) 600 MG 12 hr tablet Take 2 tablets (1,200 mg total) by mouth 2 (two) times daily. 30 tablet 1 03/14/2016 at Unknown time  . ondansetron (ZOFRAN) 4 MG tablet Take 1 tablet (4 mg total) by mouth every 8 (eight) hours as needed for nausea or vomiting. 20 tablet 0 Past Week at Unknown time  . Prenatal Vit-Fe Fumarate-FA (PRENATAL MULTIVITAMIN) TABS tablet Take 1 tablet by mouth daily at 12 noon.   03/14/2016 at Unknown time  . promethazine-codeine (PHENERGAN WITH CODEINE) 6.25-10 MG/5ML syrup Take 5-10 mLs by mouth every 6 (six) hours as needed for cough. 240 mL 0 03/15/2016 at Unknown time  . polyethylene glycol powder (GLYCOLAX/MIRALAX) powder Take 17 g by mouth daily as needed for moderate constipation. (Patient not taking: Reported on  03/15/2016) 500 g 2 More than a month at Unknown time  . promethazine-codeine (PHENERGAN WITH CODEINE) 6.25-10 MG/5ML syrup Take 5-10 mLs by mouth every 6 (six) hours as needed for cough. 240 mL 0     Review of Systems  Constitutional: Negative for chills and fever.  HENT: Positive for congestion.   Respiratory: Positive for cough.   Musculoskeletal: Positive for back pain.   Physical Exam   Blood pressure 131/74, pulse 89, temperature 97.9 F (36.6 C), temperature source Oral, resp. rate 18, height 5\' 2"  (1.575 m), weight 200 lb (90.7 kg), last menstrual period 07/17/2015.  Physical Exam  Constitutional: She appears well-developed and well-nourished.  Cardiovascular: Normal rate, regular rhythm and normal heart sounds.   Respiratory: Effort normal and breath sounds normal. She exhibits tenderness.  Musculoskeletal:       Arms: Sig. TTP on right sided intercostals and latissimus dorsi. No perispinal tenderness   EFM: 145. Mod var. No decels  MAU Course  Procedures  MDM External monitoring reassuring. Exam displays muscular TTP Responded well to flexeril and ice/heat  Assessment and Plan  1. Back pain in pregnancy- Likely intercostal and lat dorsi muscle strain due to extended period of coughing. Responded well to flexeril and heat/ice. Plan to discharge  home with flexeril rx, and instructions to rest and use heat/ice as needed.  Clearance Cootsndrew Tyson 03/16/2016, 1:18 AM   I was consulted RE: the exam and agree with above. Pt left before CNM was able to see her.  [place] Cheetara Hoge, CNM 03/16/2016 1:57 AM

## 2016-03-19 ENCOUNTER — Encounter: Payer: Medicaid Other | Admitting: Advanced Practice Midwife

## 2016-03-25 ENCOUNTER — Ambulatory Visit (INDEPENDENT_AMBULATORY_CARE_PROVIDER_SITE_OTHER): Payer: Medicaid Other | Admitting: Advanced Practice Midwife

## 2016-03-25 VITALS — BP 129/79 | HR 84 | Wt 202.0 lb

## 2016-03-25 DIAGNOSIS — M9902 Segmental and somatic dysfunction of thoracic region: Secondary | ICD-10-CM

## 2016-03-25 DIAGNOSIS — O26893 Other specified pregnancy related conditions, third trimester: Secondary | ICD-10-CM | POA: Diagnosis not present

## 2016-03-25 DIAGNOSIS — M9908 Segmental and somatic dysfunction of rib cage: Secondary | ICD-10-CM | POA: Diagnosis not present

## 2016-03-25 DIAGNOSIS — R0781 Pleurodynia: Secondary | ICD-10-CM

## 2016-03-25 DIAGNOSIS — N898 Other specified noninflammatory disorders of vagina: Secondary | ICD-10-CM

## 2016-03-25 DIAGNOSIS — Z3403 Encounter for supervision of normal first pregnancy, third trimester: Secondary | ICD-10-CM

## 2016-03-25 MED ORDER — ACETAMINOPHEN-CODEINE #3 300-30 MG PO TABS: 2.0000 | ORAL_TABLET | ORAL | 0 refills | Status: DC | PRN

## 2016-03-25 NOTE — Patient Instructions (Signed)
Third Trimester of Pregnancy The third trimester is from week 29 through week 40 (months 7 through 9). The third trimester is a time when the unborn baby (fetus) is growing rapidly. At the end of the ninth month, the fetus is about 20 inches in length and weighs 6-10 pounds. Body changes during your third trimester Your body goes through many changes during pregnancy. The changes vary from woman to woman. During the third trimester:  Your weight will continue to increase. You can expect to gain 25-35 pounds (11-16 kg) by the end of the pregnancy.  You may begin to get stretch marks on your hips, abdomen, and breasts.  You may urinate more often because the fetus is moving lower into your pelvis and pressing on your bladder.  You may develop or continue to have heartburn. This is caused by increased hormones that slow down muscles in the digestive tract.  You may develop or continue to have constipation because increased hormones slow digestion and cause the muscles that push waste through your intestines to relax.  You may develop hemorrhoids. These are swollen veins (varicose veins) in the rectum that can itch or be painful.  You may develop swollen, bulging veins (varicose veins) in your legs.  You may have increased body aches in the pelvis, back, or thighs. This is due to weight gain and increased hormones that are relaxing your joints.  You may have changes in your hair. These can include thickening of your hair, rapid growth, and changes in texture. Some women also have hair loss during or after pregnancy, or hair that feels dry or thin. Your hair will most likely return to normal after your baby is born.  Your breasts will continue to grow and they will continue to become tender. A yellow fluid (colostrum) may leak from your breasts. This is the first milk you are producing for your baby.  Your belly button may stick out.  You may notice more swelling in your hands, face, or  ankles.  You may have increased tingling or numbness in your hands, arms, and legs. The skin on your belly may also feel numb.  You may feel short of breath because of your expanding uterus.  You may have more problems sleeping. This can be caused by the size of your belly, increased need to urinate, and an increase in your body's metabolism.  You may notice the fetus "dropping," or moving lower in your abdomen.  You may have increased vaginal discharge.  Your cervix becomes thin and soft (effaced) near your due date. What to expect at prenatal visits You will have prenatal exams every 2 weeks until week 36. Then you will have weekly prenatal exams. During a routine prenatal visit:  You will be weighed to make sure you and the fetus are growing normally.  Your blood pressure will be taken.  Your abdomen will be measured to track your baby's growth.  The fetal heartbeat will be listened to.  Any test results from the previous visit will be discussed.  You may have a cervical check near your due date to see if you have effaced. At around 36 weeks, your health care provider will check your cervix. At the same time, your health care provider will also perform a test on the secretions of the vaginal tissue. This test is to determine if a type of bacteria, Group B streptococcus, is present. Your health care provider will explain this further. Your health care provider may ask you:    What your birth plan is.  How you are feeling.  If you are feeling the baby move.  If you have had any abnormal symptoms, such as leaking fluid, bleeding, severe headaches, or abdominal cramping.  If you are using any tobacco products, including cigarettes, chewing tobacco, and electronic cigarettes.  If you have any questions. Other tests or screenings that may be performed during your third trimester include:  Blood tests that check for low iron levels (anemia).  Fetal testing to check the health,  activity level, and growth of the fetus. Testing is done if you have certain medical conditions or if there are problems during the pregnancy.  Nonstress test (NST). This test checks the health of your baby to make sure there are no signs of problems, such as the baby not getting enough oxygen. During this test, a belt is placed around your belly. The baby is made to move, and its heart rate is monitored during movement. What is false labor? False labor is a condition in which you feel small, irregular tightenings of the muscles in the womb (contractions) that eventually go away. These are called Braxton Hicks contractions. Contractions may last for hours, days, or even weeks before true labor sets in. If contractions come at regular intervals, become more frequent, increase in intensity, or become painful, you should see your health care provider. What are the signs of labor?  Abdominal cramps.  Regular contractions that start at 10 minutes apart and become stronger and more frequent with time.  Contractions that start on the top of the uterus and spread down to the lower abdomen and back.  Increased pelvic pressure and dull back pain.  A watery or bloody mucus discharge that comes from the vagina.  Leaking of amniotic fluid. This is also known as your "water breaking." It could be a slow trickle or a gush. Let your doctor know if it has a color or strange odor. If you have any of these signs, call your health care provider right away, even if it is before your due date. Follow these instructions at home: Eating and drinking  Continue to eat regular, healthy meals.  Do not eat:  Raw meat or meat spreads.  Unpasteurized milk or cheese.  Unpasteurized juice.  Store-made salad.  Refrigerated smoked seafood.  Hot dogs or deli meat, unless they are piping hot.  More than 6 ounces of albacore tuna a week.  Shark, swordfish, king mackerel, or tile fish.  Store-made salads.  Raw  sprouts, such as mung bean or alfalfa sprouts.  Take prenatal vitamins as told by your health care provider.  Take 1000 mg of calcium daily as told by your health care provider.  If you develop constipation:  Take over-the-counter or prescription medicines.  Drink enough fluid to keep your urine clear or pale yellow.  Eat foods that are high in fiber, such as fresh fruits and vegetables, whole grains, and beans.  Limit foods that are high in fat and processed sugars, such as fried and sweet foods. Activity  Exercise only as directed by your health care provider. Healthy pregnant women should aim for 2 hours and 30 minutes of moderate exercise per week. If you experience any pain or discomfort while exercising, stop.  Avoid heavy lifting.  Do not exercise in extreme heat or humidity, or at high altitudes.  Wear low-heel, comfortable shoes.  Practice good posture.  Do not travel far distances unless it is absolutely necessary and only with the approval   of your health care provider.  Wear your seat belt at all times while in a car, on a bus, or on a plane.  Take frequent breaks and rest with your legs elevated if you have leg cramps or low back pain.  Do not use hot tubs, steam rooms, or saunas.  You may continue to have sex unless your health care provider tells you otherwise. Lifestyle  Do not use any products that contain nicotine or tobacco, such as cigarettes and e-cigarettes. If you need help quitting, ask your health care provider.  Do not drink alcohol.  Do not use any medicinal herbs or unprescribed drugs. These chemicals affect the formation and growth of the baby.  If you develop varicose veins:  Wear support pantyhose or compression stockings as told by your healthcare provider.  Elevate your feet for 15 minutes, 3-4 times a day.  Wear a supportive maternity bra to help with breast tenderness. General instructions  Take over-the-counter and prescription  medicines only as told by your health care provider. There are medicines that are either safe or unsafe to take during pregnancy.  Take warm sitz baths to soothe any pain or discomfort caused by hemorrhoids. Use hemorrhoid cream or witch hazel if your health care provider approves.  Avoid cat litter boxes and soil used by cats. These carry germs that can cause birth defects in the baby. If you have a cat, ask someone to clean the litter box for you.  To prepare for the arrival of your baby:  Take prenatal classes to understand, practice, and ask questions about the labor and delivery.  Make a trial run to the hospital.  Visit the hospital and tour the maternity area.  Arrange for maternity or paternity leave through employers.  Arrange for family and friends to take care of pets while you are in the hospital.  Purchase a rear-facing car seat and make sure you know how to install it in your car.  Pack your hospital bag.  Prepare the baby's nursery. Make sure to remove all pillows and stuffed animals from the baby's crib to prevent suffocation.  Visit your dentist if you have not gone during your pregnancy. Use a soft toothbrush to brush your teeth and be gentle when you floss.  Keep all prenatal follow-up visits as told by your health care provider. This is important. Contact a health care provider if:  You are unsure if you are in labor or if your water has broken.  You become dizzy.  You have mild pelvic cramps, pelvic pressure, or nagging pain in your abdominal area.  You have lower back pain.  You have persistent nausea, vomiting, or diarrhea.  You have an unusual or bad smelling vaginal discharge.  You have pain when you urinate. Get help right away if:  You have a fever.  You are leaking fluid from your vagina.  You have spotting or bleeding from your vagina.  You have severe abdominal pain or cramping.  You have rapid weight loss or weight gain.  You have  shortness of breath with chest pain.  You notice sudden or extreme swelling of your face, hands, ankles, feet, or legs.  Your baby makes fewer than 10 movements in 2 hours.  You have severe headaches that do not go away with medicine.  You have vision changes. Summary  The third trimester is from week 29 through week 40, months 7 through 9. The third trimester is a time when the unborn baby (fetus)   is growing rapidly.  During the third trimester, your discomfort may increase as you and your baby continue to gain weight. You may have abdominal, leg, and back pain, sleeping problems, and an increased need to urinate.  During the third trimester your breasts will keep growing and they will continue to become tender. A yellow fluid (colostrum) may leak from your breasts. This is the first milk you are producing for your baby.  False labor is a condition in which you feel small, irregular tightenings of the muscles in the womb (contractions) that eventually go away. These are called Braxton Hicks contractions. Contractions may last for hours, days, or even weeks before true labor sets in.  Signs of labor can include: abdominal cramps; regular contractions that start at 10 minutes apart and become stronger and more frequent with time; watery or bloody mucus discharge that comes from the vagina; increased pelvic pressure and dull back pain; and leaking of amniotic fluid. This information is not intended to replace advice given to you by your health care provider. Make sure you discuss any questions you have with your health care provider. Document Released: 03/04/2001 Document Revised: 08/16/2015 Document Reviewed: 05/11/2012 Elsevier Interactive Patient Education  2017 [employer]  

## 2016-03-25 NOTE — Progress Notes (Signed)
Patient seen - having right lower rib pain on sides x8-9 days. Was seen in [facility] - given flexeril, using heating pad without improvement. Pain with coughing. Reached behind her a felt "pop" on 24th.  See Deborah Steele's note for other history/OB.  BP 129/79   Pulse 84   Wt 202 lb (91.6 kg)   LMP 07/17/2015   BMI 36.95 kg/m  Gen  A&Ox3, NAD  MSK  Tenderness to right lower ribs. Paraspinal in thoracic area hypertonic/ Ribs restricted movement  Neuro  No focal deficits   OSE: Head   Cervical   Thoracic  T8-9 FSRR  Rib  8,9 right inhaled  Lumbar   Sacrum   Pelvis     1. Encounter for supervision of low-risk first pregnancy in third trimester - Wet prep, genital  2. Rib pain on right side 3. Somatic dysfunction of thoracic region 4. Somatic dysfunction of rib cage region OMT done - pt tolerated well. HVLA utilized. Continue heating pad, flexeril. Will give pain medicine in addition. F/u with me in 1-2 weeks.

## 2016-03-25 NOTE — Progress Notes (Signed)
   PRENATAL VISIT NOTE  Subjective:  Deborah Steele is a 21 y.o. G1P0 at 7239w5d being seen today for ongoing prenatal care.  She is currently monitored for the following issues for this low-risk pregnancy and has Ptyalism; Headache in pregnancy, antepartum; Needle stick, hypodermic, accidental; Supervision of low-risk first pregnancy; and Vaginal discharge during pregnancy in third trimester on her problem list.  Patient reports backache and pain in right ribs/right mid to upper back x 1 week not improved with rest/ice/heat/Tylenol/Flexeril. She also has vaginal discharge continuing that is yellow and thick and did finish her Flagyl Rx a few weeks ago..  Contractions: Not present. Vag. Bleeding: None.  Movement: Present. Denies leaking of fluid.   The following portions of the patient's history were reviewed and updated as appropriate: allergies, current medications, past family history, past medical history, past social history, past surgical history and problem list. Problem list updated.  Objective:   Vitals:   03/25/16 1349  BP: 129/79  Pulse: 84  Weight: 202 lb (91.6 kg)    Fetal Status: Fetal Heart Rate (bpm): 155   Movement: Present     General:  Alert, oriented and cooperative. Patient is in no acute distress.  Skin: Skin is warm and dry. No rash noted.   Cardiovascular: Normal heart rate noted  Respiratory: Normal respiratory effort, no problems with respiration noted  Abdomen: Soft, gravid, appropriate for gestational age. Pain/Pressure: Present     Pelvic:  Cervical exam deferred        Extremities: Normal range of motion.  Edema: Trace  Mental Status: Normal mood and affect. Normal behavior. Normal judgment and thought content.   Assessment and Plan:  Pregnancy: G1P0 at 1639w5d  1. Encounter for supervision of low-risk first pregnancy in third trimester   2. Rib pain on right side  - acetaminophen-codeine (TYLENOL #3) 300-30 MG tablet; Take 2 tablets by mouth every 4  (four) hours as needed for moderate pain.  Dispense: 20 tablet; Refill: 0  3. Somatic dysfunction of thoracic region --Dr Adrian BlackwaterStinson to room to evaluate. See separate note today.  4. Somatic dysfunction of rib cage region   5. Vaginal discharge during pregnancy, antepartum, third trimester  - Wet prep, genital    Preterm labor symptoms and general obstetric precautions including but not limited to vaginal bleeding, contractions, leaking of fluid and fetal movement were reviewed in detail with the patient. Please refer to After Visit Summary for other counseling recommendations.  Return in about 4 weeks (around 04/22/2016).   Hurshel PartyLisa A Leftwich-Kirby, CNM

## 2016-03-26 LAB — WET PREP, GENITAL
Trich, Wet Prep: NONE SEEN
Yeast Wet Prep HPF POC: NONE SEEN

## 2016-04-08 ENCOUNTER — Ambulatory Visit (INDEPENDENT_AMBULATORY_CARE_PROVIDER_SITE_OTHER): Payer: Medicaid Other | Admitting: Advanced Practice Midwife

## 2016-04-08 ENCOUNTER — Other Ambulatory Visit (HOSPITAL_COMMUNITY)
Admission: RE | Admit: 2016-04-08 | Discharge: 2016-04-08 | Disposition: A | Discharge status: Home / Self Care | Payer: Medicaid Other | Source: Ambulatory Visit | Attending: Advanced Practice Midwife | Admitting: Advanced Practice Midwife

## 2016-04-08 VITALS — BP 124/74 | HR 88 | Wt 205.6 lb

## 2016-04-08 DIAGNOSIS — N898 Other specified noninflammatory disorders of vagina: Secondary | ICD-10-CM

## 2016-04-08 DIAGNOSIS — O26893 Other specified pregnancy related conditions, third trimester: Secondary | ICD-10-CM

## 2016-04-08 DIAGNOSIS — Z3403 Encounter for supervision of normal first pregnancy, third trimester: Secondary | ICD-10-CM

## 2016-04-08 DIAGNOSIS — B9689 Other specified bacterial agents as the cause of diseases classified elsewhere: Secondary | ICD-10-CM

## 2016-04-08 DIAGNOSIS — Z113 Encounter for screening for infections with a predominantly sexual mode of transmission: Secondary | ICD-10-CM | POA: Diagnosis not present

## 2016-04-08 DIAGNOSIS — N760 Acute vaginitis: Secondary | ICD-10-CM

## 2016-04-08 LAB — POCT URINALYSIS DIP (DEVICE)
Bilirubin Urine: NEGATIVE
GLUCOSE, UA: NEGATIVE mg/dL
Hgb urine dipstick: NEGATIVE
KETONES UR: NEGATIVE mg/dL
Leukocytes, UA: NEGATIVE
NITRITE: NEGATIVE
PH: 5.5 (ref 5.0–8.0)
PROTEIN: NEGATIVE mg/dL
Specific Gravity, Urine: 1.01 (ref 1.005–1.030)
UROBILINOGEN UA: 0.2 mg/dL (ref 0.0–1.0)

## 2016-04-08 LAB — OB RESULTS CONSOLE GBS: STREP GROUP B AG: NEGATIVE

## 2016-04-08 MED ORDER — TERCONAZOLE 0.4 % VA CREA: 1.0000 | TOPICAL_CREAM | Freq: Every day | VAGINAL | 0 refills | Status: DC

## 2016-04-08 MED ORDER — METRONIDAZOLE 500 MG PO TABS: 500.0000 mg | ORAL_TABLET | Freq: Two times a day (BID) | ORAL | 0 refills | Status: DC

## 2016-04-08 NOTE — Progress Notes (Signed)
   PRENATAL VISIT NOTE  Subjective:  Deborah Steele is a 21 y.o. G1P0 at 2058w5d being seen today for ongoing prenatal care.  She is currently monitored for the following issues for this low-risk pregnancy and has Ptyalism; Headache in pregnancy, antepartum; Needle stick, hypodermic, accidental; Supervision of low-risk first pregnancy; and Vaginal discharge during pregnancy in third trimester on her problem list.  Patient reports occasional contractions, vaginal irritation and continued vaginal discharge.  Rib pain has improved, but is still present. Wet prep last visit showed few Clue cells.  Contractions: Irregular.  .  Movement: Present. Denies leaking of fluid.   The following portions of the patient's history were reviewed and updated as appropriate: allergies, current medications, past family history, past medical history, past social history, past surgical history and problem list. Problem list updated.  Objective:   Vitals:   04/08/16 0955 04/08/16 1112  BP: (!) 135/98 124/74  Pulse: 88   Weight: 205 lb 9.6 oz (93.3 kg)     Fetal Status: Fetal Heart Rate (bpm): 141 Fundal Height: 36 cm Movement: Present  Presentation: Vertex  General:  Alert, oriented and cooperative. Patient is in no acute distress.  Skin: Skin is warm and dry. No rash noted.   Cardiovascular: Normal heart rate noted  Respiratory: Normal respiratory effort, no problems with respiration noted  Abdomen: Soft, gravid, appropriate for gestational age. Pain/Pressure: Present     Pelvic:  Cervical exam declined. Requests to self-collect swabs.        Extremities: Normal range of motion.  Edema: Trace  Mental Status: Normal mood and affect. Normal behavior. Normal judgment and thought content.   Assessment and Plan:  Pregnancy: G1P0 at 3358w5d  1. Encounter for supervision of low-risk first pregnancy in third trimester  - Culture, beta strep (group b only) - [facility]lam[facility]  2. BV  (bacterial vaginosis)  - metroNIDAZOLE (FLAGYL) 500 MG tablet; Take 1 tablet (500 mg total) by mouth 2 (two) times daily.  Dispense: 14 tablet; Refill: 0  3. Vaginal discharge during pregnancy in third trimester  - GC[facility]lam[facility] - Will aslo Rx Terazol in case Sx do no resolve w/ Flagyl since Sx more characteristic of VVC.   Transient HNT, Repeat Nml.  - Check for proteinuria - CTO closely  Term labor symptoms and general obstetric precautions including but not limited to vaginal bleeding, contractions, leaking of fluid and fetal movement were reviewed in detail with the patient. Please refer to After Visit Summary for other counseling recommendations.  Return in about 1 week (around 04/15/2016) for ROB with Dr Adrian BlackwaterStinson. Dorathy Kinsman.   Shylah Dossantos, CNM

## 2016-04-08 NOTE — Patient Instructions (Addendum)
Braxton Hicks Contractions Contractions of the uterus can occur throughout pregnancy. Contractions are not always a sign that you are in labor.  WHAT ARE BRAXTON HICKS CONTRACTIONS?  Contractions that occur before labor are called Braxton Hicks contractions, or false labor. Toward the end of pregnancy (32-34 weeks), these contractions can develop more often and may become more forceful. This is not true labor because these contractions do not result in opening (dilatation) and thinning of the cervix. They are sometimes difficult to tell apart from true labor because these contractions can be forceful and people have different pain tolerances. You should not feel embarrassed if you go to the hospital with false labor. Sometimes, the only way to tell if you are in true labor is for your health care provider to look for changes in the cervix. If there are no prenatal problems or other health problems associated with the pregnancy, it is completely safe to be sent home with false labor and await the onset of true labor. HOW CAN YOU TELL THE DIFFERENCE BETWEEN TRUE AND FALSE LABOR? False Labor   The contractions of false labor are usually shorter and not as hard as those of true labor.   The contractions are usually irregular.   The contractions are often felt in the front of the lower abdomen and in the groin.   The contractions may go away when you walk around or change positions while lying down.   The contractions get weaker and are shorter lasting as time goes on.   The contractions do not usually become progressively stronger, regular, and closer together as with true labor.  True Labor   Contractions in true labor last 30-70 seconds, become very regular, usually become more intense, and increase in frequency.   The contractions do not go away with walking.   The discomfort is usually felt in the top of the uterus and spreads to the lower abdomen and low back.   True labor can be  determined by your health care provider with an exam. This will show that the cervix is dilating and getting thinner.  WHAT TO REMEMBER  Keep up with your usual exercises and follow other instructions given by your health care provider.   Take medicines as directed by your health care provider.   Keep your regular prenatal appointments.   Eat and drink lightly if you think you are going into labor.   If Braxton Hicks contractions are making you uncomfortable:   Change your position from lying down or resting to walking, or from walking to resting.   Sit and rest in a tub of warm water.   Drink 2-3 glasses of water. Dehydration may cause these contractions.   Do slow and deep breathing several times an hour.  WHEN SHOULD I SEEK IMMEDIATE MEDICAL CARE? Seek immediate medical care if:  Your contractions become stronger, more regular, and closer together.   You have fluid leaking or gushing from your vagina.   You have a fever.   You pass blood-tinged mucus.   You have vaginal bleeding.   You have continuous abdominal pain.   You have low back pain that you never had before.   You feel your baby's head pushing down and causing pelvic pressure.   Your baby is not moving as much as it used to.  This information is not intended to replace advice given to you by your health care provider. Make sure you discuss any questions you have with your health care   provider. Document Released: 03/10/2005 Document Revised: 07/02/2015 Document Reviewed: 12/20/2012 Elsevier Interactive Patient Education  2017 [employer]   Group B Streptococcus Infection During Pregnancy Group B Streptococcus (GBS) is a type of bacteria (Streptococcus agalactiae) that is often found in healthy people, commonly in the rectum, vagina, and intestines. In people who are healthy and not pregnant, the bacteria rarely cause serious illness or complications. However, women who test positive for  GBS during pregnancy can pass the bacteria to their baby during childbirth, which can cause serious infection in the baby after birth. Women with GBS may also have infections during their pregnancy or immediately after childbirth, such as such as urinary tract infections (UTIs) or infections of the uterus (uterine infections). Having GBS also increases a woman's risk of complications during pregnancy, such as early (preterm) labor or delivery, miscarriage, or stillbirth. Routine testing (screening) for GBS is recommended for all pregnant women. What increases the risk? You may have a higher risk for GBS infection during pregnancy if you had one during a past pregnancy. What are the signs or symptoms? In most cases, GBS infection does not cause symptoms in pregnant women. Signs and symptoms of a possible GBS-related infection may include:  Labor starting before the 37th week of pregnancy.  A UTI or bladder infection, which may cause:  Fever.  Pain or burning during urination.  Frequent urination.  Fever during labor, along with:  Bad-smelling discharge.  Uterine tenderness.  Rapid heartbeat in the mother, baby, or both. Rare but serious symptoms of a possible GBS-related infection in women include:  Blood infection (septicemia). This may cause fever, chills, or confusion.  Lung infection (pneumonia). This may cause fever, chills, cough, rapid breathing, difficulty breathing, or chest pain.  Bone, joint, skin, or soft tissue infection. How is this diagnosed? You may be screened for GBS between week 35 and week 37 of your pregnancy. If you have symptoms of preterm labor, you may be screened earlier. This condition is diagnosed based on lab test results from:  A swab of fluid from the vagina and rectum.  A urine sample. How is this treated? This condition is treated with antibiotic medicine. When you go into labor, or as soon as your water breaks (your membranes rupture), you will  be given antibiotics through an IV tube. Antibiotics will continue until after you give birth. If you are having a cesarean delivery, you do not need antibiotics unless your membranes have already ruptured. Follow these instructions at home:  Take over-the-counter and prescription medicines only as told by your health care provider.  Take your antibiotic medicine as told by your health care provider. Do not stop taking the antibiotic even if you start to feel better.  Keep all pre-birth (prenatal) visits and follow-up visits as told by your health care provider. This is important. Contact a health care provider if:  You have pain or burning when you urinate.  You have to urinate frequently.  You have a fever or chills.  You develop a bad-smelling vaginal discharge. Get help right away if:  Your membranes rupture.  You go into labor.  You have severe pain in your abdomen.  You have difficulty breathing.  You have chest pain. This information is not intended to replace advice given to you by your health care provider. Make sure you discuss any questions you have with your health care provider. Document Released: 06/17/2007 Document Revised: 10/05/2015 Document Reviewed: 10/04/2015 Elsevier Interactive Patient Education  2017 [employer]

## 2016-04-10 LAB — CULTURE, BETA STREP (GROUP B ONLY)

## 2016-04-10 LAB — GC/CHLAMYDIA PROBE AMP (~~LOC~~) NOT AT ARMC
CHLAMYDIA, DNA PROBE: NEGATIVE
NEISSERIA GONORRHEA: NEGATIVE

## 2016-04-17 ENCOUNTER — Ambulatory Visit (INDEPENDENT_AMBULATORY_CARE_PROVIDER_SITE_OTHER): Payer: Medicaid Other | Admitting: Family Medicine

## 2016-04-17 VITALS — BP 138/89 | HR 91 | Wt 208.3 lb

## 2016-04-17 DIAGNOSIS — Z3403 Encounter for supervision of normal first pregnancy, third trimester: Secondary | ICD-10-CM

## 2016-04-17 NOTE — Progress Notes (Signed)
   PRENATAL VISIT NOTE  Subjective:  Pennie Rushingna Belardo is a 21 y.o. G1P0 at 9235w0d being seen today for ongoing prenatal care.  She is currently monitored for the following issues for this low-risk pregnancy and has Ptyalism; Headache in pregnancy, antepartum; Needle stick, hypodermic, accidental; Supervision of low-risk first pregnancy; and Vaginal discharge during pregnancy in third trimester on her problem list.  Patient reports no complaints.  Contractions: Irritability. Vag. Bleeding: None.  Movement: Present. Denies leaking of fluid.   The following portions of the patient's history were reviewed and updated as appropriate: allergies, current medications, past family history, past medical history, past social history, past surgical history and problem list. Problem list updated.  Objective:   Vitals:   04/17/16 0823 04/17/16 0824  BP: (!) 150/80 138/89  Pulse: 85 91  Weight: 208 lb 4.8 oz (94.5 kg)     Fetal Status: Fetal Heart Rate (bpm): 155   Movement: Present     General:  Alert, oriented and cooperative. Patient is in no acute distress.  Skin: Skin is warm and dry. No rash noted.   Cardiovascular: Normal heart rate noted  Respiratory: Normal respiratory effort, no problems with respiration noted  Abdomen: Soft, gravid, appropriate for gestational age. Pain/Pressure: Present     Pelvic:  Cervical exam deferred        Extremities: Normal range of motion.  Edema: Trace  Mental Status: Normal mood and affect. Normal behavior. Normal judgment and thought content.   Assessment and Plan:  Pregnancy: G1P0 at 4435w0d  1. Encounter for supervision of low-risk first pregnancy in third trimester Fht and FH normal. BP a little elevated - high normal on recheck. Will continue to watch closely. Cultures normal. Preeclampsia sxs discussed.  Term labor symptoms and general obstetric precautions including but not limited to vaginal bleeding, contractions, leaking of fluid and fetal movement were  reviewed in detail with the patient. Please refer to After Visit Summary for other counseling recommendations.  Return in about 1 week (around 04/24/2016).   Levie HeritageJacob J Stinson, DO

## 2016-04-25 ENCOUNTER — Ambulatory Visit (INDEPENDENT_AMBULATORY_CARE_PROVIDER_SITE_OTHER): Payer: Medicaid Other | Admitting: Family Medicine

## 2016-04-25 VITALS — BP 132/88 | HR 92 | Wt 209.0 lb

## 2016-04-25 DIAGNOSIS — O133 Gestational [pregnancy-induced] hypertension without significant proteinuria, third trimester: Secondary | ICD-10-CM

## 2016-04-25 DIAGNOSIS — O163 Unspecified maternal hypertension, third trimester: Secondary | ICD-10-CM

## 2016-04-25 DIAGNOSIS — Z3403 Encounter for supervision of normal first pregnancy, third trimester: Secondary | ICD-10-CM

## 2016-04-25 LAB — CBC
HEMATOCRIT: 38 % (ref 35.0–45.0)
HEMOGLOBIN: 12.8 g/dL (ref 11.7–15.5)
MCH: 29.1 pg (ref 27.0–33.0)
MCHC: 33.7 g/dL (ref 32.0–36.0)
MCV: 86.4 fL (ref 80.0–100.0)
MPV: 9.8 fL (ref 7.5–12.5)
Platelets: 272 10*3/uL (ref 140–400)
RBC: 4.4 MIL/uL (ref 3.80–5.10)
RDW: 15.8 % — ABNORMAL HIGH (ref 11.0–15.0)
WBC: 9.7 10*3/uL (ref 3.8–10.8)

## 2016-04-25 LAB — COMPREHENSIVE METABOLIC PANEL
ALBUMIN: 3 g/dL — AB (ref 3.6–5.1)
ALT: 10 U/L (ref 6–29)
AST: 12 U/L (ref 10–30)
Alkaline Phosphatase: 155 U/L — ABNORMAL HIGH (ref 33–115)
BUN: 7 mg/dL (ref 7–25)
CHLORIDE: 107 mmol/L (ref 98–110)
CO2: 20 mmol/L (ref 20–31)
CREATININE: 0.65 mg/dL (ref 0.50–1.10)
Calcium: 8.8 mg/dL (ref 8.6–10.2)
Glucose, Bld: 72 mg/dL (ref 65–99)
POTASSIUM: 4.1 mmol/L (ref 3.5–5.3)
Sodium: 135 mmol/L (ref 135–146)
TOTAL PROTEIN: 6.1 g/dL (ref 6.1–8.1)
Total Bilirubin: 0.3 mg/dL (ref 0.2–1.2)

## 2016-04-25 LAB — POCT URINALYSIS DIP (DEVICE)
BILIRUBIN URINE: NEGATIVE
Glucose, UA: NEGATIVE mg/dL
Ketones, ur: NEGATIVE mg/dL
LEUKOCYTES UA: NEGATIVE
Nitrite: NEGATIVE
Protein, ur: NEGATIVE mg/dL
Specific Gravity, Urine: 1.015 (ref 1.005–1.030)
Urobilinogen, UA: 0.2 mg/dL (ref 0.0–1.0)
pH: 6.5 (ref 5.0–8.0)

## 2016-04-25 NOTE — Patient Instructions (Signed)
Third Trimester of Pregnancy The third trimester is from week 29 through week 40 (months 7 through 9). The third trimester is a time when the unborn baby (fetus) is growing rapidly. At the end of the ninth month, the fetus is about 20 inches in length and weighs 6-10 pounds. Body changes during your third trimester Your body goes through many changes during pregnancy. The changes vary from woman to woman. During the third trimester:  Your weight will continue to increase. You can expect to gain 25-35 pounds (11-16 kg) by the end of the pregnancy.  You may begin to get stretch marks on your hips, abdomen, and breasts.  You may urinate more often because the fetus is moving lower into your pelvis and pressing on your bladder.  You may develop or continue to have heartburn. This is caused by increased hormones that slow down muscles in the digestive tract.  You may develop or continue to have constipation because increased hormones slow digestion and cause the muscles that push waste through your intestines to relax.  You may develop hemorrhoids. These are swollen veins (varicose veins) in the rectum that can itch or be painful.  You may develop swollen, bulging veins (varicose veins) in your legs.  You may have increased body aches in the pelvis, back, or thighs. This is due to weight gain and increased hormones that are relaxing your joints.  You may have changes in your hair. These can include thickening of your hair, rapid growth, and changes in texture. Some women also have hair loss during or after pregnancy, or hair that feels dry or thin. Your hair will most likely return to normal after your baby is born.  Your breasts will continue to grow and they will continue to become tender. A yellow fluid (colostrum) may leak from your breasts. This is the first milk you are producing for your baby.  Your belly button may stick out.  You may notice more swelling in your hands, face, or  ankles.  You may have increased tingling or numbness in your hands, arms, and legs. The skin on your belly may also feel numb.  You may feel short of breath because of your expanding uterus.  You may have more problems sleeping. This can be caused by the size of your belly, increased need to urinate, and an increase in your body's metabolism.  You may notice the fetus "dropping," or moving lower in your abdomen.  You may have increased vaginal discharge.  Your cervix becomes thin and soft (effaced) near your due date. What to expect at prenatal visits You will have prenatal exams every 2 weeks until week 36. Then you will have weekly prenatal exams. During a routine prenatal visit:  You will be weighed to make sure you and the fetus are growing normally.  Your blood pressure will be taken.  Your abdomen will be measured to track your baby's growth.  The fetal heartbeat will be listened to.  Any test results from the previous visit will be discussed.  You may have a cervical check near your due date to see if you have effaced. At around 36 weeks, your health care provider will check your cervix. At the same time, your health care provider will also perform a test on the secretions of the vaginal tissue. This test is to determine if a type of bacteria, Group B streptococcus, is present. Your health care provider will explain this further. Your health care provider may ask you:    What your birth plan is.  How you are feeling.  If you are feeling the baby move.  If you have had any abnormal symptoms, such as leaking fluid, bleeding, severe headaches, or abdominal cramping.  If you are using any tobacco products, including cigarettes, chewing tobacco, and electronic cigarettes.  If you have any questions. Other tests or screenings that may be performed during your third trimester include:  Blood tests that check for low iron levels (anemia).  Fetal testing to check the health,  activity level, and growth of the fetus. Testing is done if you have certain medical conditions or if there are problems during the pregnancy.  Nonstress test (NST). This test checks the health of your baby to make sure there are no signs of problems, such as the baby not getting enough oxygen. During this test, a belt is placed around your belly. The baby is made to move, and its heart rate is monitored during movement. What is false labor? False labor is a condition in which you feel small, irregular tightenings of the muscles in the womb (contractions) that eventually go away. These are called Braxton Hicks contractions. Contractions may last for hours, days, or even weeks before true labor sets in. If contractions come at regular intervals, become more frequent, increase in intensity, or become painful, you should see your health care provider. What are the signs of labor?  Abdominal cramps.  Regular contractions that start at 10 minutes apart and become stronger and more frequent with time.  Contractions that start on the top of the uterus and spread down to the lower abdomen and back.  Increased pelvic pressure and dull back pain.  A watery or bloody mucus discharge that comes from the vagina.  Leaking of amniotic fluid. This is also known as your "water breaking." It could be a slow trickle or a gush. Let your doctor know if it has a color or strange odor. If you have any of these signs, call your health care provider right away, even if it is before your due date. Follow these instructions at home: Eating and drinking  Continue to eat regular, healthy meals.  Do not eat:  Raw meat or meat spreads.  Unpasteurized milk or cheese.  Unpasteurized juice.  Store-made salad.  Refrigerated smoked seafood.  Hot dogs or deli meat, unless they are piping hot.  More than 6 ounces of albacore tuna a week.  Shark, swordfish, king mackerel, or tile fish.  Store-made salads.  Raw  sprouts, such as mung bean or alfalfa sprouts.  Take prenatal vitamins as told by your health care provider.  Take 1000 mg of calcium daily as told by your health care provider.  If you develop constipation:  Take over-the-counter or prescription medicines.  Drink enough fluid to keep your urine clear or pale yellow.  Eat foods that are high in fiber, such as fresh fruits and vegetables, whole grains, and beans.  Limit foods that are high in fat and processed sugars, such as fried and sweet foods. Activity  Exercise only as directed by your health care provider. Healthy pregnant women should aim for 2 hours and 30 minutes of moderate exercise per week. If you experience any pain or discomfort while exercising, stop.  Avoid heavy lifting.  Do not exercise in extreme heat or humidity, or at high altitudes.  Wear low-heel, comfortable shoes.  Practice good posture.  Do not travel far distances unless it is absolutely necessary and only with the approval   of your health care provider.  Wear your seat belt at all times while in a car, on a bus, or on a plane.  Take frequent breaks and rest with your legs elevated if you have leg cramps or low back pain.  Do not use hot tubs, steam rooms, or saunas.  You may continue to have sex unless your health care provider tells you otherwise. Lifestyle  Do not use any products that contain nicotine or tobacco, such as cigarettes and e-cigarettes. If you need help quitting, ask your health care provider.  Do not drink alcohol.  Do not use any medicinal herbs or unprescribed drugs. These chemicals affect the formation and growth of the baby.  If you develop varicose veins:  Wear support pantyhose or compression stockings as told by your healthcare provider.  Elevate your feet for 15 minutes, 3-4 times a day.  Wear a supportive maternity bra to help with breast tenderness. General instructions  Take over-the-counter and prescription  medicines only as told by your health care provider. There are medicines that are either safe or unsafe to take during pregnancy.  Take warm sitz baths to soothe any pain or discomfort caused by hemorrhoids. Use hemorrhoid cream or witch hazel if your health care provider approves.  Avoid cat litter boxes and soil used by cats. These carry germs that can cause birth defects in the baby. If you have a cat, ask someone to clean the litter box for you.  To prepare for the arrival of your baby:  Take prenatal classes to understand, practice, and ask questions about the labor and delivery.  Make a trial run to the hospital.  Visit the hospital and tour the maternity area.  Arrange for maternity or paternity leave through employers.  Arrange for family and friends to take care of pets while you are in the hospital.  Purchase a rear-facing car seat and make sure you know how to install it in your car.  Pack your hospital bag.  Prepare the baby's nursery. Make sure to remove all pillows and stuffed animals from the baby's crib to prevent suffocation.  Visit your dentist if you have not gone during your pregnancy. Use a soft toothbrush to brush your teeth and be gentle when you floss.  Keep all prenatal follow-up visits as told by your health care provider. This is important. Contact a health care provider if:  You are unsure if you are in labor or if your water has broken.  You become dizzy.  You have mild pelvic cramps, pelvic pressure, or nagging pain in your abdominal area.  You have lower back pain.  You have persistent nausea, vomiting, or diarrhea.  You have an unusual or bad smelling vaginal discharge.  You have pain when you urinate. Get help right away if:  You have a fever.  You are leaking fluid from your vagina.  You have spotting or bleeding from your vagina.  You have severe abdominal pain or cramping.  You have rapid weight loss or weight gain.  You have  shortness of breath with chest pain.  You notice sudden or extreme swelling of your face, hands, ankles, feet, or legs.  Your baby makes fewer than 10 movements in 2 hours.  You have severe headaches that do not go away with medicine.  You have vision changes. Summary  The third trimester is from week 29 through week 40, months 7 through 9. The third trimester is a time when the unborn baby (fetus)   is growing rapidly.  During the third trimester, your discomfort may increase as you and your baby continue to gain weight. You may have abdominal, leg, and back pain, sleeping problems, and an increased need to urinate.  During the third trimester your breasts will keep growing and they will continue to become tender. A yellow fluid (colostrum) may leak from your breasts. This is the first milk you are producing for your baby.  False labor is a condition in which you feel small, irregular tightenings of the muscles in the womb (contractions) that eventually go away. These are called Braxton Hicks contractions. Contractions may last for hours, days, or even weeks before true labor sets in.  Signs of labor can include: abdominal cramps; regular contractions that start at 10 minutes apart and become stronger and more frequent with time; watery or bloody mucus discharge that comes from the vagina; increased pelvic pressure and dull back pain; and leaking of amniotic fluid. This information is not intended to replace advice given to you by your health care provider. Make sure you discuss any questions you have with your health care provider. Document Released: 03/04/2001 Document Revised: 08/16/2015 Document Reviewed: 05/11/2012 [employer] Interactive Patient Education  2017 [employer]   Breastfeeding Deciding to breastfeed is one of the best choices you can make for you and your baby. A change in hormones during pregnancy causes your breast tissue to grow and increases the number and size of your  milk ducts. These hormones also allow proteins, sugars, and fats from your blood supply to make breast milk in your milk-producing glands. Hormones prevent breast milk from being released before your baby is born as well as prompt milk flow after birth. Once breastfeeding has begun, thoughts of your baby, as well as his or her sucking or crying, can stimulate the release of milk from your milk-producing glands. Benefits of breastfeeding For Your Baby  Your first milk (colostrum) helps your baby's digestive system function better.  There are antibodies in your milk that help your baby fight off infections.  Your baby has a lower incidence of asthma, allergies, and sudden infant death syndrome.  The nutrients in breast milk are better for your baby than infant formulas and are designed uniquely for your baby's needs.  Breast milk improves your baby's brain development.  Your baby is less likely to develop other conditions, such as childhood obesity, asthma, or type 2 diabetes mellitus. For You  Breastfeeding helps to create a very special bond between you and your baby.  Breastfeeding is convenient. Breast milk is always available at the correct temperature and costs nothing.  Breastfeeding helps to burn calories and helps you lose the weight gained during pregnancy.  Breastfeeding makes your uterus contract to its prepregnancy size faster and slows bleeding (lochia) after you give birth.  Breastfeeding helps to lower your risk of developing type 2 diabetes mellitus, osteoporosis, and breast or ovarian cancer later in life. Signs that your baby is hungry Early Signs of Hunger  Increased alertness or activity.  Stretching.  Movement of the head from side to side.  Movement of the head and opening of the mouth when the corner of the mouth or cheek is stroked (rooting).  Increased sucking sounds, smacking lips, cooing, sighing, or squeaking.  Hand-to-mouth movements.  Increased  sucking of fingers or hands. Late Signs of Hunger  Fussing.  Intermittent crying. Extreme Signs of Hunger  Signs of extreme hunger will require calming and consoling before your baby will   be able to breastfeed successfully. Do not wait for the following signs of extreme hunger to occur before you initiate breastfeeding:  Restlessness.  A loud, strong cry.  Screaming. Breastfeeding basics  Breastfeeding Initiation  Find a comfortable place to sit or lie down, with your neck and back well supported.  Place a pillow or rolled up blanket under your baby to bring him or her to the level of your breast (if you are seated). Nursing pillows are specially designed to help support your arms and your baby while you breastfeed.  Make sure that your baby's abdomen is facing your abdomen.  Gently massage your breast. With your fingertips, massage from your chest wall toward your nipple in a circular motion. This encourages milk flow. You may need to continue this action during the feeding if your milk flows slowly.  Support your breast with 4 fingers underneath and your thumb above your nipple. Make sure your fingers are well away from your nipple and your baby's mouth.  Stroke your baby's lips gently with your finger or nipple.  When your baby's mouth is open wide enough, quickly bring your baby to your breast, placing your entire nipple and as much of the colored area around your nipple (areola) as possible into your baby's mouth.  More areola should be visible above your baby's upper lip than below the lower lip.  Your baby's tongue should be between his or her lower gum and your breast.  Ensure that your baby's mouth is correctly positioned around your nipple (latched). Your baby's lips should create a seal on your breast and be turned out (everted).  It is common for your baby to suck about 2-3 minutes in order to start the flow of breast milk. Latching  Teaching your baby how to latch  on to your breast properly is very important. An improper latch can cause nipple pain and decreased milk supply for you and poor weight gain in your baby. Also, if your baby is not latched onto your nipple properly, he or she may swallow some air during feeding. This can make your baby fussy. Burping your baby when you switch breasts during the feeding can help to get rid of the air. However, teaching your baby to latch on properly is still the best way to prevent fussiness from swallowing air while breastfeeding. Signs that your baby has successfully latched on to your nipple:  Silent tugging or silent sucking, without causing you pain.  Swallowing heard between every 3-4 sucks.  Muscle movement above and in front of his or her ears while sucking. Signs that your baby has not successfully latched on to nipple:  Sucking sounds or smacking sounds from your baby while breastfeeding.  Nipple pain. If you think your baby has not latched on correctly, slip your finger into the corner of your baby's mouth to break the suction and place it between your baby's gums. Attempt breastfeeding initiation again. Signs of Successful Breastfeeding  Signs from your baby:  A gradual decrease in the number of sucks or complete cessation of sucking.  Falling asleep.  Relaxation of his or her body.  Retention of a small amount of milk in his or her mouth.  Letting go of your breast by himself or herself. Signs from you:  Breasts that have increased in firmness, weight, and size 1-3 hours after feeding.  Breasts that are softer immediately after breastfeeding.  Increased milk volume, as well as a change in milk consistency and color by   the fifth day of breastfeeding.  Nipples that are not sore, cracked, or bleeding. Signs That Your Baby is Getting Enough Milk  Wetting at least 1-2 diapers during the first 24 hours after birth.  Wetting at least 5-6 diapers every 24 hours for the first week after  birth. The urine should be clear or pale yellow by 5 days after birth.  Wetting 6-8 diapers every 24 hours as your baby continues to grow and develop.  At least 3 stools in a 24-hour period by age 5 days. The stool should be soft and yellow.  At least 3 stools in a 24-hour period by age 7 days. The stool should be seedy and yellow.  No loss of weight greater than 10% of birth weight during the first 3 days of age.  Average weight gain of 4-7 ounces (113-198 g) per week after age 4 days.  Consistent daily weight gain by age 5 days, without weight loss after the age of 2 weeks. After a feeding, your baby may spit up a small amount. This is common. Breastfeeding frequency and duration Frequent feeding will help you make more milk and can prevent sore nipples and breast engorgement. Breastfeed when you feel the need to reduce the fullness of your breasts or when your baby shows signs of hunger. This is called "breastfeeding on demand." Avoid introducing a pacifier to your baby while you are working to establish breastfeeding (the first 4-6 weeks after your baby is born). After this time you may choose to use a pacifier. Research has shown that pacifier use during the first year of a baby's life decreases the risk of sudden infant death syndrome (SIDS). Allow your baby to feed on each breast as long as he or she wants. Breastfeed until your baby is finished feeding. When your baby unlatches or falls asleep while feeding from the first breast, offer the second breast. Because newborns are often sleepy in the first few weeks of life, you may need to awaken your baby to get him or her to feed. Breastfeeding times will vary from baby to baby. However, the following rules can serve as a guide to help you ensure that your baby is properly fed:  Newborns (babies 4 weeks of age or younger) may breastfeed every 1-3 hours.  Newborns should not go longer than 3 hours during the day or 5 hours during the night  without breastfeeding.  You should breastfeed your baby a minimum of 8 times in a 24-hour period until you begin to introduce solid foods to your baby at around 6 months of age. Breast milk pumping Pumping and storing breast milk allows you to ensure that your baby is exclusively fed your breast milk, even at times when you are unable to breastfeed. This is especially important if you are going back to work while you are still breastfeeding or when you are not able to be present during feedings. Your lactation consultant can give you guidelines on how long it is safe to store breast milk. A breast pump is a machine that allows you to pump milk from your breast into a sterile bottle. The pumped breast milk can then be stored in a refrigerator or freezer. Some breast pumps are operated by hand, while others use electricity. Ask your lactation consultant which type will work best for you. Breast pumps can be purchased, but some hospitals and breastfeeding support groups lease breast pumps on a monthly basis. A lactation consultant can teach you how to   hand express breast milk, if you prefer not to use a pump. Caring for your breasts while you breastfeed Nipples can become dry, cracked, and sore while breastfeeding. The following recommendations can help keep your breasts moisturized and healthy:  Avoid using soap on your nipples.  Wear a supportive bra. Although not required, special nursing bras and tank tops are designed to allow access to your breasts for breastfeeding without taking off your entire bra or top. Avoid wearing underwire-style bras or extremely tight bras.  Air dry your nipples for 3-4minutes after each feeding.  Use only cotton bra pads to absorb leaked breast milk. Leaking of breast milk between feedings is normal.  Use lanolin on your nipples after breastfeeding. Lanolin helps to maintain your skin's normal moisture barrier. If you use pure lanolin, you do not need to wash it off  before feeding your baby again. Pure lanolin is not toxic to your baby. You may also hand express a few drops of breast milk and gently massage that milk into your nipples and allow the milk to air dry. In the first few weeks after giving birth, some women experience extremely full breasts (engorgement). Engorgement can make your breasts feel heavy, warm, and tender to the touch. Engorgement peaks within 3-5 days after you give birth. The following recommendations can help ease engorgement:  Completely empty your breasts while breastfeeding or pumping. You may want to start by applying warm, moist heat (in the shower or with warm water-soaked hand towels) just before feeding or pumping. This increases circulation and helps the milk flow. If your baby does not completely empty your breasts while breastfeeding, pump any extra milk after he or she is finished.  Wear a snug bra (nursing or regular) or tank top for 1-2 days to signal your body to slightly decrease milk production.  Apply ice packs to your breasts, unless this is too uncomfortable for you.  Make sure that your baby is latched on and positioned properly while breastfeeding. If engorgement persists after 48 hours of following these recommendations, contact your health care provider or a lactation consultant. Overall health care recommendations while breastfeeding  Eat healthy foods. Alternate between meals and snacks, eating 3 of each per day. Because what you eat affects your breast milk, some of the foods may make your baby more irritable than usual. Avoid eating these foods if you are sure that they are negatively affecting your baby.  Drink milk, fruit juice, and water to satisfy your thirst (about 10 glasses a day).  Rest often, relax, and continue to take your prenatal vitamins to prevent fatigue, stress, and anemia.  Continue breast self-awareness checks.  Avoid chewing and smoking tobacco. Chemicals from cigarettes that pass  into breast milk and exposure to secondhand smoke may harm your baby.  Avoid alcohol and drug use, including marijuana. Some medicines that may be harmful to your baby can pass through breast milk. It is important to ask your health care provider before taking any medicine, including all over-the-counter and prescription medicine as well as vitamin and herbal supplements. It is possible to become pregnant while breastfeeding. If birth control is desired, ask your health care provider about options that will be safe for your baby. Contact a health care provider if:  You feel like you want to stop breastfeeding or have become frustrated with breastfeeding.  You have painful breasts or nipples.  Your nipples are cracked or bleeding.  Your breasts are red, tender, or warm.  You have   a swollen area on either breast.  You have a fever or chills.  You have nausea or vomiting.  You have drainage other than breast milk from your nipples.  Your breasts do not become full before feedings by the fifth day after you give birth.  You feel sad and depressed.  Your baby is too sleepy to eat well.  Your baby is having trouble sleeping.  Your baby is wetting less than 3 diapers in a 24-hour period.  Your baby has less than 3 stools in a 24-hour period.  Your baby's skin or the white part of his or her eyes becomes yellow.  Your baby is not gaining weight by 5 days of age. Get help right away if:  Your baby is overly tired (lethargic) and does not want to wake up and feed.  Your baby develops an unexplained fever. This information is not intended to replace advice given to you by your health care provider. Make sure you discuss any questions you have with your health care provider. Document Released: 03/10/2005 Document Revised: 08/22/2015 Document Reviewed: 09/01/2012 Elsevier Interactive Patient Education  2017 Elsevier Inc.  

## 2016-04-25 NOTE — Progress Notes (Signed)
   PRENATAL VISIT NOTE  Subjective:  Deborah Steele is a 21 y.o. G1P0 at 4462w1d being seen today for ongoing prenatal care.  She is currently monitored for the following issues for this low-risk pregnancy and has Ptyalism; Headache in pregnancy, antepartum; Needle stick, hypodermic, accidental; and Supervision of low-risk first pregnancy on her problem list.  Patient reports no complaints, specifically denies headache, vision changes or RUQ pain.  Contractions: Irritability. Vag. Bleeding: None.  Movement: Present. Denies leaking of fluid.   The following portions of the patient's history were reviewed and updated as appropriate: allergies, current medications, past family history, past medical history, past social history, past surgical history and problem list. Problem list updated.  Objective:   Vitals:   04/25/16 0831 04/25/16 0836  BP: (!) 156/82 132/88  Pulse: 92   Weight: 209 lb (94.8 kg)   Recheck BP 132/88  Fetal Status: Fetal Heart Rate (bpm): 150 Fundal Height: 37 cm Movement: Present  Presentation: Vertex  General:  Alert, oriented and cooperative. Patient is in no acute distress.  Skin: Skin is warm and dry. No rash noted.   Cardiovascular: Normal heart rate noted  Respiratory: Normal respiratory effort, no problems with respiration noted  Abdomen: Soft, gravid, appropriate for gestational age. Pain/Pressure: Present     Pelvic:  Cervical exam deferred        Extremities: Normal range of motion.  Edema: Trace  Mental Status: Normal mood and affect. Normal behavior. Normal judgment and thought content.   Assessment and Plan:  Pregnancy: G1P0 at 7762w1d  1. Encounter for supervision of low-risk first pregnancy in third trimester Continue routine prenatal care.   2. Elevated blood pressure affecting pregnancy in third trimester, antepartum X 2 but both BPs down on re-check (immediately) Check labs--though no protein on dip today and return for BP check in 3 days PIH  precautions discussed. - CBC - Comprehensive metabolic panel - Protein / creatinine ratio, urine  Preterm labor symptoms and general obstetric precautions including but not limited to vaginal bleeding, contractions, leaking of fluid and fetal movement were reviewed in detail with the patient. Please refer to After Visit Summary for other counseling recommendations.  Return in 3 days (on 04/28/2016) for BP check.   Reva Boresanya S Arlene Brickel, MD

## 2016-04-26 LAB — PROTEIN / CREATININE RATIO, URINE
CREATININE, URINE: 67 mg/dL (ref 20–320)
Protein Creatinine Ratio: 149 mg/g creat (ref 21–161)
TOTAL PROTEIN, URINE: 10 mg/dL (ref 5–24)

## 2016-04-28 ENCOUNTER — Ambulatory Visit: Payer: Medicaid Other | Admitting: *Deleted

## 2016-04-28 VITALS — BP 130/86

## 2016-04-28 DIAGNOSIS — Z0130 Encounter for examination of blood pressure without abnormal findings: Secondary | ICD-10-CM

## 2016-04-28 NOTE — Progress Notes (Signed)
Pt in for bp check. Feels well. No headaches or visual disturbances. Has routine prenatal on Friday. Discussed patient with Dr. Shawnie PonsPratt she stated that patients bps are good and that she can return on Friday as planned. Advised patient that if she develops headaches, blurry vision, increased swelling, vaginal bleeding, or decreased fetal movement that she should go to [facility] for evaluation. Patient is agreeable to this and had no further questions.

## 2016-05-02 ENCOUNTER — Encounter (HOSPITAL_COMMUNITY): Payer: Self-pay | Admitting: *Deleted

## 2016-05-02 ENCOUNTER — Ambulatory Visit (INDEPENDENT_AMBULATORY_CARE_PROVIDER_SITE_OTHER): Payer: Medicaid Other | Admitting: Family Medicine

## 2016-05-02 ENCOUNTER — Inpatient Hospital Stay (HOSPITAL_COMMUNITY)
Admission: AD | Admit: 2016-05-02 | Discharge: 2016-05-07 | DRG: 766 | Disposition: A | Discharge status: Home / Self Care | Payer: Commercial Managed Care - PPO | Source: Ambulatory Visit | Attending: Obstetrics & Gynecology | Admitting: Obstetrics & Gynecology

## 2016-05-02 VITALS — BP 138/98 | HR 95 | Wt 210.1 lb

## 2016-05-02 DIAGNOSIS — Z3A39 39 weeks gestation of pregnancy: Secondary | ICD-10-CM

## 2016-05-02 DIAGNOSIS — O163 Unspecified maternal hypertension, third trimester: Secondary | ICD-10-CM

## 2016-05-02 DIAGNOSIS — O1493 Unspecified pre-eclampsia, third trimester: Secondary | ICD-10-CM | POA: Diagnosis present

## 2016-05-02 DIAGNOSIS — O134 Gestational [pregnancy-induced] hypertension without significant proteinuria, complicating childbirth: Secondary | ICD-10-CM | POA: Diagnosis present

## 2016-05-02 DIAGNOSIS — Z3403 Encounter for supervision of normal first pregnancy, third trimester: Secondary | ICD-10-CM

## 2016-05-02 DIAGNOSIS — O133 Gestational [pregnancy-induced] hypertension without significant proteinuria, third trimester: Secondary | ICD-10-CM

## 2016-05-02 DIAGNOSIS — Z8759 Personal history of other complications of pregnancy, childbirth and the puerperium: Secondary | ICD-10-CM | POA: Diagnosis present

## 2016-05-02 DIAGNOSIS — O1414 Severe pre-eclampsia complicating childbirth: Secondary | ICD-10-CM | POA: Diagnosis present

## 2016-05-02 DIAGNOSIS — O1413 Severe pre-eclampsia, third trimester: Secondary | ICD-10-CM | POA: Diagnosis not present

## 2016-05-02 DIAGNOSIS — Z98891 History of uterine scar from previous surgery: Secondary | ICD-10-CM

## 2016-05-02 DIAGNOSIS — O324XX0 Maternal care for high head at term, not applicable or unspecified: Secondary | ICD-10-CM | POA: Diagnosis present

## 2016-05-02 DIAGNOSIS — O139 Gestational [pregnancy-induced] hypertension without significant proteinuria, unspecified trimester: Secondary | ICD-10-CM | POA: Diagnosis present

## 2016-05-02 HISTORY — DX: Headache, unspecified: R51.9

## 2016-05-02 HISTORY — DX: Headache: R51

## 2016-05-02 LAB — TYPE AND SCREEN
ABO/RH(D): B POS
Antibody Screen: NEGATIVE

## 2016-05-02 LAB — PROTEIN / CREATININE RATIO, URINE
Creatinine, Urine: 132 mg/dL
Protein Creatinine Ratio: 0.12 mg/mg{Cre} (ref 0.00–0.15)
Total Protein, Urine: 16 mg/dL

## 2016-05-02 LAB — CBC
HCT: 36 % (ref 36.0–46.0)
Hemoglobin: 12.5 g/dL (ref 12.0–15.0)
MCH: 29.3 pg (ref 26.0–34.0)
MCHC: 34.7 g/dL (ref 30.0–36.0)
MCV: 84.5 fL (ref 78.0–100.0)
Platelets: 253 10*3/uL (ref 150–400)
RBC: 4.26 MIL/uL (ref 3.87–5.11)
RDW: 15.4 % (ref 11.5–15.5)
WBC: 10.7 10*3/uL — ABNORMAL HIGH (ref 4.0–10.5)

## 2016-05-02 LAB — COMPREHENSIVE METABOLIC PANEL
ALBUMIN: 2.7 g/dL — AB (ref 3.5–5.0)
ALK PHOS: 163 U/L — AB (ref 38–126)
ALT: 12 U/L — AB (ref 14–54)
AST: 19 U/L (ref 15–41)
Anion gap: 8 (ref 5–15)
BUN: 10 mg/dL (ref 6–20)
CALCIUM: 8.7 mg/dL — AB (ref 8.9–10.3)
CO2: 20 mmol/L — AB (ref 22–32)
CREATININE: 0.63 mg/dL (ref 0.44–1.00)
Chloride: 107 mmol/L (ref 101–111)
GFR calc Af Amer: >60 mL/min (ref >60)
GFR calc non Af Amer: >60 mL/min (ref >60)
GLUCOSE: 73 mg/dL (ref 65–99)
Potassium: 4.2 mmol/L (ref 3.5–5.1)
SODIUM: 135 mmol/L (ref 135–145)
Total Bilirubin: 0.3 mg/dL (ref 0.3–1.2)
Total Protein: 6.2 g/dL — ABNORMAL LOW (ref 6.5–8.1)

## 2016-05-02 LAB — LACTATE DEHYDROGENASE: LDH: 133 U/L (ref 98–192)

## 2016-05-02 LAB — URIC ACID: Uric Acid, Serum: 6 mg/dL (ref 2.3–6.6)

## 2016-05-02 LAB — ABO/RH: ABO/RH(D): B POS

## 2016-05-02 MED ORDER — MISOPROSTOL 25 MCG QUARTER TABLET
25.0000 ug | ORAL_TABLET | Freq: Once | ORAL | Status: AC
  Administered 2016-05-02: 25 ug via VAGINAL
  Filled 2016-05-02: qty 0.25

## 2016-05-02 MED ORDER — ACETAMINOPHEN 325 MG PO TABS
650.0000 mg | ORAL_TABLET | ORAL | Status: DC | PRN
  Administered 2016-05-02 – 2016-05-03 (×2): 650 mg via ORAL
  Filled 2016-05-02 (×2): qty 2

## 2016-05-02 MED ORDER — LIDOCAINE HCL (PF) 1 % IJ SOLN: 30.0000 mL | INTRAMUSCULAR | Status: DC | PRN

## 2016-05-02 MED ORDER — ONDANSETRON HCL 4 MG/2ML IJ SOLN: 4.0000 mg | Freq: Four times a day (QID) | INTRAMUSCULAR | Status: DC | PRN

## 2016-05-02 MED ORDER — OXYTOCIN 40 UNITS IN LACTATED RINGERS INFUSION - SIMPLE MED: 2.5000 [IU]/h | INTRAVENOUS | Status: DC

## 2016-05-02 MED ORDER — OXYCODONE-ACETAMINOPHEN 5-325 MG PO TABS
1.0000 | ORAL_TABLET | ORAL | Status: DC | PRN
  Administered 2016-05-03: 1 via ORAL
  Filled 2016-05-02: qty 1

## 2016-05-02 MED ORDER — FLEET ENEMA 7-19 GM/118ML RE ENEM
1.0000 | ENEMA | RECTAL | Status: DC | PRN
Start: 2016-05-02 — End: 2016-05-04

## 2016-05-02 MED ORDER — SOD CITRATE-CITRIC ACID 500-334 MG/5ML PO SOLN
30.0000 mL | ORAL | Status: DC | PRN
  Administered 2016-05-04: 30 mL via ORAL
  Filled 2016-05-02 (×2): qty 15

## 2016-05-02 MED ORDER — LACTATED RINGERS IV SOLN: 500.0000 mL | INTRAVENOUS | Status: DC | PRN

## 2016-05-02 MED ORDER — ZOLPIDEM TARTRATE 5 MG PO TABS
5.0000 mg | ORAL_TABLET | Freq: Every evening | ORAL | Status: DC | PRN
  Administered 2016-05-02: 5 mg via ORAL
  Filled 2016-05-02: qty 1

## 2016-05-02 MED ORDER — OXYCODONE-ACETAMINOPHEN 5-325 MG PO TABS: 2.0000 | ORAL_TABLET | ORAL | Status: DC | PRN

## 2016-05-02 MED ORDER — MAGNESIUM SULFATE BOLUS VIA INFUSION
4.0000 g | Freq: Once | INTRAVENOUS | Status: AC
  Administered 2016-05-02: 4 g via INTRAVENOUS
  Filled 2016-05-02: qty 500

## 2016-05-02 MED ORDER — MAGNESIUM SULFATE 50 % IJ SOLN
2.0000 g/h | INTRAVENOUS | Status: AC
  Administered 2016-05-02 – 2016-05-04 (×3): 2 g/h via INTRAVENOUS
  Filled 2016-05-02 (×3): qty 80

## 2016-05-02 MED ORDER — LACTATED RINGERS IV SOLN
INTRAVENOUS | Status: DC
  Administered 2016-05-02 – 2016-05-03 (×2): via INTRAVENOUS

## 2016-05-02 MED ORDER — OXYTOCIN BOLUS FROM INFUSION: 500.0000 mL | Freq: Once | INTRAVENOUS | Status: DC

## 2016-05-02 MED ORDER — BUTORPHANOL TARTRATE 1 MG/ML IJ SOLN
2.0000 mg | INTRAMUSCULAR | Status: DC | PRN
  Administered 2016-05-02: 2 mg via INTRAVENOUS
  Filled 2016-05-02: qty 2

## 2016-05-02 MED ORDER — LABETALOL HCL 5 MG/ML IV SOLN
20.0000 mg | INTRAVENOUS | Status: DC | PRN
  Administered 2016-05-02: 20 mg via INTRAVENOUS
  Filled 2016-05-02: qty 4

## 2016-05-02 MED ORDER — HYDRALAZINE HCL 20 MG/ML IJ SOLN
5.0000 mg | INTRAMUSCULAR | Status: AC | PRN
  Administered 2016-05-02: 10 mg via INTRAVENOUS
  Administered 2016-05-02: 5 mg via INTRAVENOUS
  Filled 2016-05-02: qty 1

## 2016-05-02 MED ORDER — MISOPROSTOL 200 MCG PO TABS
50.0000 ug | ORAL_TABLET | ORAL | Status: DC
  Administered 2016-05-02 – 2016-05-03 (×2): 50 ug via ORAL
  Filled 2016-05-02 (×2): qty 0.5

## 2016-05-02 NOTE — Progress Notes (Signed)
   PRENATAL VISIT NOTE  Subjective:  Pennie Rushingna Bonanno is a 21 y.o. G1P0 at 1064w1d being seen today for ongoing prenatal care.  She is currently monitored for the following issues for this high-risk pregnancy and has Ptyalism; Headache in pregnancy, antepartum; Needle stick, hypodermic, accidental; and Supervision of low-risk first pregnancy on her problem list.  Patient reports no complaints.  Contractions: Irritability.  .  Movement: Present. Denies leaking of fluid.   The following portions of the patient's history were reviewed and updated as appropriate: allergies, current medications, past family history, past medical history, past social history, past surgical history and problem list. Problem list updated.  Objective:   Vitals:   05/02/16 0840 05/02/16 0843  BP: (!) 150/98 (!) 138/98  Pulse: 92 95  Weight: 210 lb 1.6 oz (95.3 kg)     Fetal Status: Fetal Heart Rate (bpm): 150   Movement: Present     General:  Alert, oriented and cooperative. Patient is in no acute distress.  Skin: Skin is warm and dry. No rash noted.   Cardiovascular: Normal heart rate noted  Respiratory: Normal respiratory effort, no problems with respiration noted  Abdomen: Soft, gravid, appropriate for gestational age. Pain/Pressure: Present     Pelvic:  Cervical exam deferred        Extremities: Normal range of motion.     Mental Status: Normal mood and affect. Normal behavior. Normal judgment and thought content.   Assessment and Plan:  Pregnancy: G1P0 at 8264w1d  1. Encounter for supervision of low-risk first pregnancy in third trimester FHT and FH normal  2. Elevated blood pressure affecting pregnancy in third trimester, antepartum Patient sent upstairs for induction for GHTN vs pre-eclampsia  Term labor symptoms and general obstetric precautions including but not limited to vaginal bleeding, contractions, leaking of fluid and fetal movement were reviewed in detail with the patient. Please refer to After  Visit Summary for other counseling recommendations.  No Follow-up on file.   Levie HeritageJacob J Stinson, DO

## 2016-05-02 NOTE — Progress Notes (Signed)
Evaluated patient for progress of labor. She is feeling well without complaints. Cervix is unchanged at 1cm and thick. Placed foley bulb without complications and vaginal cytotec. Will start pitocin when bulb falls out. FHR 160 basline, mod variability, pos accels, variable decels and contractions q5-458min.  Anticipate SVD.

## 2016-05-02 NOTE — Progress Notes (Addendum)
Patient ID: Deborah Steele, female   DOB: 05-14-1995, 21 y.o.   MRN: [id number]  Pt feeling crampy and intermittent mild H/A; no visual disturbances; has required hydralazine x 2 and labetalol IV x 1 to keep BP out of severe range; also with new onset chills and congestion  BP was 160s/100-122; now 139/90, 138/79, P 100-110, T 97.9 FHR 140s, +accels, no decels Ctx irreg q 3-7 mins Cx deferred (foley bulb still in place)  IUP@39 .1wks Severe pre-e IOL process  Mag sulfate started Rev'd pre-e with patient and family; questions answered Pre-e labs nl Continue to check foley and apply traction Anticipate SVD  Cam HaiSHAW, Rien Marland CNM 05/02/2016 10:46 PM

## 2016-05-02 NOTE — H&P (Signed)
LABOR AND DELIVERY ADMISSION HISTORY AND PHYSICAL NOTE  Deborah Steele is a 21 y.o. female G1P0000 with IUP at [redacted]w[redacted]d by [place] presenting for IOL for gHTN vs: pre-eclampsia. Patient denies any CP, SOB, changes in vision, edema in LE, face or hands.   She reports positive fetal movement. She denies leakage of fluid or vaginal bleeding.  Prenatal History/Complications:  Past Medical History: Past Medical History:  Diagnosis Date  . Headache   . Influenza 02/2016  . Medical history non-contributory     Past Surgical History: Past Surgical History:  Procedure Laterality Date  . NO PAST SURGERIES      Obstetrical History: OB History    Gravida Para Term Preterm AB Living   1 0 0 0 0 0   SAB TAB Ectopic Multiple Live Births   0 0 0 0 0      Social History: Social History   Social History  . Marital status: Married    Spouse name: N/A  . Number of children: N/A  . Years of education: N/A   Social History Main Topics  . Smoking status: Never Smoker  . Smokeless tobacco: Never Used  . Alcohol use No  . Drug use: No  . Sexual activity: Yes    Birth control/ protection: None   Other Topics Concern  . None   Social History Narrative  . None    Family History: Family History  Problem Relation Age of Onset  . Hyperlipidemia Mother   . Hypertension Mother     Allergies: No Known Allergies  Prescriptions Prior to Admission  Medication Sig Dispense Refill Last Dose  . acetaminophen (TYLENOL) 500 MG tablet Take 500 mg by mouth every 6 (six) hours as needed for headache.   Past Month at Unknown time  . Prenatal Vit-Fe Fumarate-FA (PRENATAL MULTIVITAMIN) TABS tablet Take 1 tablet by mouth daily at 12 noon.   05/01/2016 at Unknown time     Review of Systems   All systems reviewed and negative except as stated in HPI  Height 5\' 2"  (1.575 m), weight 95.3 kg (210 lb), last menstrual period 07/17/2015. General appearance: alert and cooperative Lungs: clear to auscultation  bilaterally Heart: regular rate and rhythm Abdomen: soft, non-tender; bowel sounds normal Extremities: No calf swelling or tenderness Presentation: cephalic Fetal monitoring: FHR baseline 135, mod variability, pos accels, no decels, contractions q3-68min.  Uterine activity:  Dilation: 1 Effacement (%): Thick Exam by:: Dr. Myrtie Soman   Prenatal labs: ABO, Rh: B/POS/-- (08/16 1610) Antibody: NEG (08/16 0921) Rubella: Immune RPR: NON REAC (11/28 0001)  HBsAg: NEGATIVE (08/16 0921)  HIV: NONREACTIVE (11/28 0001)  GBS:  neg 1 hr Glucola: 106 Genetic screening:  neg Anatomy US: normal  Prenatal Transfer Tool  Maternal Diabetes: No Genetic Screening: Normal Maternal Ultrasounds/Referrals: Normal Fetal Ultrasounds or other Referrals:  None Maternal Substance Abuse:  No Significant Maternal Medications:  None Significant Maternal Lab Results: None  No results found for this or any previous visit (from the past 24 hour(s)).  Patient Active Problem List   Diagnosis Date Noted  . Supervision of low-risk first pregnancy 01/03/2016  . Needle stick, hypodermic, accidental 12/06/2015  . Ptyalism 11/07/2015  . Headache in pregnancy, antepartum 11/07/2015    Assessment: Deborah Steele is a 21 y.o. G1P0000 at [redacted]w[redacted]d here for IOL for gHTN vs: pre-eclampsia after being seen in office today. Blood pressure 138/98 upon presentation. Denies any end organ signs/symptoms. Cervix dilated to 1cm and thick and vertex presentation.   #Labor:  IOL with cytotec. Will recheck after four hours and attempt foley bulb if 1-2cm and likely follow up with pitocin. Anticipate SVD #Pain: epidural #FWB:  not hooked up yet #ID: GBS neg #MOF: both #MOC: condoms #Circ: NA #gestational hypertension: Check CMP/CBC Pr/Cr. Monitor BP.   Renne Muscaaniel L Warden, MD PGY-1 05/02/2016, 11:39 AM  OB FELLOW HISTORY AND PHYSICAL ATTESTATION  I have seen and examined this patient; I agree with above documentation in the resident's  note.    Ernestina Pennaicholas Schenk 05/02/2016, 4:27 PM

## 2016-05-02 NOTE — Anesthesia Pain Management Evaluation Note (Signed)
  CRNA Pain Management Visit Note  Patient: Deborah RushingAna Blatchford, 21 y.o., female  "Hello I am a member of the anesthesia team at [facility]. We have an anesthesia team available at all times to provide care throughout the hospital, including epidural management and anesthesia for C-section. I don't know your plan for the delivery whether it a natural birth, water birth, IV sedation, nitrous supplementation, doula or epidural, but we want to meet your pain goals."   1.Was your pain managed to your expectations on prior hospitalizations?   No prior hospitalizations  2.What is your expectation for pain management during this hospitalization?     Epidural  3.How can we help you reach that goal? Epidural, when ready.  Record the patient's initial score and the patient's pain goal.   Pain: 0  Pain Goal: 5 [facility] wants you to be able to say your pain was always managed very well.  Keywon Mestre L 05/02/2016

## 2016-05-03 ENCOUNTER — Inpatient Hospital Stay (HOSPITAL_COMMUNITY): Payer: Commercial Managed Care - PPO | Admitting: Anesthesiology

## 2016-05-03 LAB — MAGNESIUM: MAGNESIUM: 4.8 mg/dL — AB (ref 1.7–2.4)

## 2016-05-03 LAB — CBC
HCT: 35.8 % — ABNORMAL LOW (ref 36.0–46.0)
HEMOGLOBIN: 12.6 g/dL (ref 12.0–15.0)
MCH: 29.8 pg (ref 26.0–34.0)
MCHC: 35.2 g/dL (ref 30.0–36.0)
MCV: 84.6 fL (ref 78.0–100.0)
Platelets: 252 10*3/uL (ref 150–400)
RBC: 4.23 MIL/uL (ref 3.87–5.11)
RDW: 16.1 % — ABNORMAL HIGH (ref 11.5–15.5)
WBC: 14.8 10*3/uL — AB (ref 4.0–10.5)

## 2016-05-03 LAB — RPR: RPR Ser Ql: NONREACTIVE

## 2016-05-03 MED ORDER — PHENYLEPHRINE 40 MCG/ML (10ML) SYRINGE FOR IV PUSH (FOR BLOOD PRESSURE SUPPORT)
80.0000 ug | PREFILLED_SYRINGE | INTRAVENOUS | Status: DC | PRN
  Filled 2016-05-03: qty 10

## 2016-05-03 MED ORDER — DIPHENHYDRAMINE HCL 50 MG/ML IJ SOLN: 12.5000 mg | INTRAMUSCULAR | Status: DC | PRN

## 2016-05-03 MED ORDER — LIDOCAINE HCL (PF) 1 % IJ SOLN
INTRAMUSCULAR | Status: DC | PRN
  Administered 2016-05-03 (×2): 5 mL via EPIDURAL

## 2016-05-03 MED ORDER — PHENYLEPHRINE 40 MCG/ML (10ML) SYRINGE FOR IV PUSH (FOR BLOOD PRESSURE SUPPORT): 80.0000 ug | PREFILLED_SYRINGE | INTRAVENOUS | Status: DC | PRN

## 2016-05-03 MED ORDER — OXYTOCIN 40 UNITS IN LACTATED RINGERS INFUSION - SIMPLE MED
1.0000 m[IU]/min | INTRAVENOUS | Status: DC
  Administered 2016-05-03: 2 m[IU]/min via INTRAVENOUS
  Filled 2016-05-03: qty 1000

## 2016-05-03 MED ORDER — LACTATED RINGERS IV SOLN: 500.0000 mL | Freq: Once | INTRAVENOUS | Status: DC

## 2016-05-03 MED ORDER — BUTALBITAL-APAP-CAFFEINE 50-325-40 MG PO TABS
1.0000 | ORAL_TABLET | Freq: Once | ORAL | Status: AC
  Administered 2016-05-03: 1 via ORAL
  Filled 2016-05-03: qty 1

## 2016-05-03 MED ORDER — TERBUTALINE SULFATE 1 MG/ML IJ SOLN: 0.2500 mg | Freq: Once | INTRAMUSCULAR | Status: DC | PRN

## 2016-05-03 MED ORDER — FENTANYL 2.5 MCG/ML BUPIVACAINE 1/10 % EPIDURAL INFUSION (WH - ANES): 14.0000 mL/h | INTRAMUSCULAR | Status: DC | PRN

## 2016-05-03 MED ORDER — FENTANYL 2.5 MCG/ML BUPIVACAINE 1/10 % EPIDURAL INFUSION (WH - ANES)
14.0000 mL/h | INTRAMUSCULAR | Status: DC | PRN
  Administered 2016-05-03 – 2016-05-04 (×3): 14 mL/h via EPIDURAL
  Filled 2016-05-03 (×3): qty 100

## 2016-05-03 MED ORDER — EPHEDRINE 5 MG/ML INJ: 10.0000 mg | INTRAVENOUS | Status: DC | PRN

## 2016-05-03 NOTE — Progress Notes (Signed)
Patient ID: Deborah Steele, female   DOB: 09-Sep-1995, 21 y.o.   MRN: [id number] Had some rest during the night; foley out ~0400; now with intermittent H/A like earlier  BPs: 116/72,124/78, 117/67, other VSS, afebrile FHR 120s, +accels, no decels Irreg ctx  IUP@39 .2wks Pre-e Cx favorable  Will eat a light breakfast and then start Pit Will try Percocet for H/A Anticipate SVD  Cam HaiSHAW, Kathyann Spaugh CNM 05/03/2016 6:27 AM

## 2016-05-03 NOTE — Progress Notes (Signed)
Spoke with dr leggett at nursing station - aware pts b/p is stable but that h/a continues despite tylenol and stadol - new order to check mag level and give fioricet

## 2016-05-03 NOTE — Progress Notes (Signed)
Patient ID: Deborah Steele, female   DOB: Nov 16, 1995, 21 y.o.   MRN: [id number]  [name] seen & examined for progress of labor. Patient comfortable with epidural. Patient on magnesium drip. Headache is negligible now. She feels tired.    O:  Vitals:   05/03/16 1930 05/03/16 2000 05/03/16 2030 05/03/16 2100  BP: 127/83 (!) 153/91 (!) 155/90 (!) 155/92  Pulse: 92 100 98 96  Resp:      Temp: 97.9 F (36.6 C)     TempSrc: Oral     SpO2:      Weight:      Height:        Dilation: 6 Effacement (%): 80 Station: -3 Presentation: Vertex Exam by:: Mumaw    FHT: 130 bpm, mod var, +accels, no decels  IUPC: not adequate, on 20 milliunits of pitocin  A/P: Pitocin break for 1-1.5 hours, then restart at half the pitocin dose currently on BPs elevated but not in severe range, currently on magnesium drip Continue expectant management Anticipate SVD

## 2016-05-03 NOTE — Anesthesia Preprocedure Evaluation (Addendum)
Anesthesia Evaluation  Patient identified by MRN, date of birth, ID band Patient awake    Reviewed: Allergy & Precautions, NPO status , Patient's Chart, lab work & pertinent test results  Airway Mallampati: II   Neck ROM: full    Dental   Pulmonary    breath sounds clear to auscultation       Cardiovascular hypertension,  Rhythm:regular Rate:Normal  Pre-eclampsia   Neuro/Psych  Headaches,    GI/Hepatic   Endo/Other    Renal/GU      Musculoskeletal   Abdominal   Peds  Hematology   Anesthesia Other Findings   Reproductive/Obstetrics                             Anesthesia Physical Anesthesia Plan  ASA: II  Anesthesia Plan: Epidural   Post-op Pain Management:    Induction: Intravenous  Airway Management Planned: Natural Airway  Additional Equipment:   Intra-op Plan:   Post-operative Plan:   Informed Consent: I have reviewed the patients History and Physical, chart, labs and discussed the procedure including the risks, benefits and alternatives for the proposed anesthesia with the patient or authorized representative who has indicated his/her understanding and acceptance.     Plan Discussed with: Anesthesiologist  Anesthesia Plan Comments: (For C/S with labor epidural)       Anesthesia Quick Evaluation

## 2016-05-03 NOTE — Progress Notes (Signed)
This note also relates to the following rows which could not be included: Dose (milli-units/min) Oxytocin - Cannot attach notes to extension rows Rate (mL/hr) Oxytocin - Cannot attach notes to extension rows Concentration Oxytocin - Cannot attach notes to extension ro  Pitocin stopped per Dr Omer JackMumaw.  Will reevaluate in 1-1.5 hrs and plan to start pitocin at half.

## 2016-05-03 NOTE — Progress Notes (Signed)
Patient ID: Pennie Rushingna Hummel, female   DOB: 1995/05/04, 21 y.o.   MRN: [id number]  [name] seen & examined for progress of labor. Patient comfortable in the room, barely feeling the contractions. States headache has improved with fioricet, but still present.    O:  Vitals:   05/03/16 1042 05/03/16 1153 05/03/16 1300 05/03/16 1330  BP:   138/83 (!) 148/93  Pulse:   93 99  Resp: 18 18    Temp:  97.9 F (36.6 C)    TempSrc:  Oral    SpO2:      Weight:      Height:        Dilation: 6 Effacement (%): 50 Station: -3 Presentation: Vertex Exam by:: Dr Omer JackMumaw  AROM performed, clear fluid returned. Patient tolerated procedure well.   FHT: 125 bpm, mod var, +accels, no decels TOCO: Irregular q4-7 min   A/P: AROM performed BPs stable Magnesium gtt continued Pitocin Headache improved Continue expectant management Anticipate SVD

## 2016-05-03 NOTE — Progress Notes (Signed)
Deborah Steele is a 21 yo G1P0 at 6514w2d by 16 week US here for IOL 2/2 preecelampsia with severe features (severe BP and headache).  [name] seen & examined for progress of labor. Patient comfortable.  Planning for epidural when needed.  O:  Vitals:   05/03/16 0600 05/03/16 0845 05/03/16 1042 05/03/16 1153  BP: 136/79     Pulse: (!) 106     Resp:  18 18 18   Temp: 97.9 F (36.6 C) 97.4 F (36.3 C)  97.9 F (36.6 C)  TempSrc: Oral Oral  Oral  SpO2:      Weight:      Height:        Dilation: 5 Effacement (%): 70 Station: -2 Presentation: Vertex Exam by:: patti moore rn   FHT: baseline 130s bpm, mod var, +accels, no decels TOCO: irregular contractions  A/P: Deborah Rushingna Duty is a 20yo G1P0 at 2714w2d here for IOL 2/2 preeclampsia with severe features including severe BP and headache.  Contractions remain irregular with reactive strip.  BPs have been wnl this am and her intermittent headache has improved after fioricet x1 at 10:57am today. Continue pitocin at 12 milliunits/min Continue magnesium Planning for epidural when appropriate Continue expectant management Anticipate SVD  Satira AnisSung W Park, Medical Student 05/03/2016 1:15 PM   OB FELLOW MEDICAL STUDENT NOTE ATTESTATION  I have seen and examined this patient with the medical student. I agree with the plan above.    Jen MowElizabeth Caeley Dohrmann, DO [place] Fellow 05/03/2016

## 2016-05-03 NOTE — Anesthesia Procedure Notes (Addendum)
Epidural Patient location during procedure: OB Start time: 05/03/2016 4:04 PM End time: 05/03/2016 4:12 PM  Staffing Anesthesiologist: Chaney MallingHODIERNE, ADAM Performed: anesthesiologist   Preanesthetic Checklist Completed: patient identified, site marked, pre-op evaluation, timeout performed, IV checked, risks and benefits discussed and monitors and equipment checked  Epidural Patient position: sitting Prep: DuraPrep Patient monitoring: heart rate, cardiac monitor, continuous pulse ox and blood pressure Approach: midline Location: L2-L3 Injection technique: LOR saline  Needle:  Needle type: Tuohy  Needle gauge: 17 G Needle length: 9 cm Needle insertion depth: 7 cm Catheter type: closed end flexible Catheter size: 19 Gauge Catheter at skin depth: 12 cm Test dose: negative and Other  Assessment Sensory level: T8 Events: blood not aspirated, injection not painful, no injection resistance and negative IV test  Additional Notes Informed consent obtained prior to proceeding including risk of failure, 1% risk of PDPH, risk of minor discomfort and bruising.  Discussed rare but serious complications including epidural abscess, permanent nerve injury, epidural hematoma.  Discussed alternatives to epidural analgesia and patient desires to proceed.  Timeout performed pre-procedure verifying patient name, procedure, and platelet count.  Patient tolerated procedure well. Reason for block:procedure for pain

## 2016-05-04 ENCOUNTER — Encounter (HOSPITAL_COMMUNITY)
Admission: AD | Disposition: A | Discharge status: Home / Self Care | Payer: Self-pay | Source: Ambulatory Visit | Attending: Obstetrics & Gynecology

## 2016-05-04 ENCOUNTER — Encounter (HOSPITAL_COMMUNITY): Payer: Self-pay | Admitting: *Deleted

## 2016-05-04 SURGERY — Surgical Case
Anesthesia: Epidural

## 2016-05-04 MED ORDER — SIMETHICONE 80 MG PO CHEW
80.0000 mg | CHEWABLE_TABLET | ORAL | Status: DC | PRN
  Administered 2016-05-05: 80 mg via ORAL
  Filled 2016-05-04: qty 1

## 2016-05-04 MED ORDER — IBUPROFEN 600 MG PO TABS: 600.0000 mg | ORAL_TABLET | Freq: Four times a day (QID) | ORAL | Status: DC | PRN

## 2016-05-04 MED ORDER — BUTALBITAL-APAP-CAFFEINE 50-325-40 MG PO TABS
1.0000 | ORAL_TABLET | Freq: Once | ORAL | Status: AC
  Administered 2016-05-04: 1 via ORAL
  Filled 2016-05-04: qty 1

## 2016-05-04 MED ORDER — DIBUCAINE 1 % RE OINT: 1.0000 "application " | TOPICAL_OINTMENT | RECTAL | Status: DC | PRN

## 2016-05-04 MED ORDER — TETANUS-DIPHTH-ACELL PERTUSSIS 5-2.5-18.5 LF-MCG/0.5 IM SUSP: 0.5000 mL | Freq: Once | INTRAMUSCULAR | Status: DC

## 2016-05-04 MED ORDER — CEFAZOLIN SODIUM-DEXTROSE 2-3 GM-% IV SOLR
INTRAVENOUS | Status: DC | PRN
  Administered 2016-05-04: 2 g via INTRAVENOUS

## 2016-05-04 MED ORDER — MORPHINE SULFATE (PF) 0.5 MG/ML IJ SOLN
INTRAMUSCULAR | Status: DC | PRN
  Administered 2016-05-04: 3 mg via EPIDURAL

## 2016-05-04 MED ORDER — DEXAMETHASONE SODIUM PHOSPHATE 4 MG/ML IJ SOLN
INTRAMUSCULAR | Status: DC | PRN
  Administered 2016-05-04: 4 mg via INTRAVENOUS

## 2016-05-04 MED ORDER — ONDANSETRON HCL 4 MG/2ML IJ SOLN
INTRAMUSCULAR | Status: DC | PRN
  Administered 2016-05-04: 4 mg via INTRAVENOUS

## 2016-05-04 MED ORDER — DEXTROSE 5 % IV SOLN
1.0000 ug/kg/h | INTRAVENOUS | Status: DC | PRN
  Filled 2016-05-04: qty 2

## 2016-05-04 MED ORDER — SCOPOLAMINE 1 MG/3DAYS TD PT72
MEDICATED_PATCH | TRANSDERMAL | Status: DC | PRN
  Administered 2016-05-04: 1 via TRANSDERMAL

## 2016-05-04 MED ORDER — SODIUM BICARBONATE 8.4 % IV SOLN
INTRAVENOUS | Status: DC | PRN
  Administered 2016-05-04: 3 mL via EPIDURAL
  Administered 2016-05-04 (×2): 2 mL via EPIDURAL
  Administered 2016-05-04: 3 mL via EPIDURAL
  Administered 2016-05-04: 7 mL via EPIDURAL
  Administered 2016-05-04: 3 mL via EPIDURAL

## 2016-05-04 MED ORDER — ACETAMINOPHEN 325 MG PO TABS
650.0000 mg | ORAL_TABLET | ORAL | Status: DC | PRN
  Administered 2016-05-05 – 2016-05-06 (×2): 650 mg via ORAL
  Filled 2016-05-04 (×2): qty 2

## 2016-05-04 MED ORDER — IBUPROFEN 600 MG PO TABS
600.0000 mg | ORAL_TABLET | Freq: Four times a day (QID) | ORAL | Status: DC
  Administered 2016-05-04 – 2016-05-07 (×12): 600 mg via ORAL
  Filled 2016-05-04 (×12): qty 1

## 2016-05-04 MED ORDER — ONDANSETRON HCL 4 MG/2ML IJ SOLN: 4.0000 mg | Freq: Three times a day (TID) | INTRAMUSCULAR | Status: DC | PRN

## 2016-05-04 MED ORDER — PROMETHAZINE HCL 25 MG/ML IJ SOLN: 6.2500 mg | INTRAMUSCULAR | Status: DC | PRN

## 2016-05-04 MED ORDER — HYDROMORPHONE HCL 1 MG/ML IJ SOLN: 0.2500 mg | INTRAMUSCULAR | Status: DC | PRN

## 2016-05-04 MED ORDER — MEASLES, MUMPS & RUBELLA VAC ~~LOC~~ INJ: 0.5000 mL | INJECTION | Freq: Once | SUBCUTANEOUS | Status: DC

## 2016-05-04 MED ORDER — MENTHOL 3 MG MT LOZG: 1.0000 | LOZENGE | OROMUCOSAL | Status: DC | PRN

## 2016-05-04 MED ORDER — SCOPOLAMINE 1 MG/3DAYS TD PT72: 1.0000 | MEDICATED_PATCH | Freq: Once | TRANSDERMAL | Status: DC

## 2016-05-04 MED ORDER — DIPHENHYDRAMINE HCL 25 MG PO CAPS: 25.0000 mg | ORAL_CAPSULE | Freq: Four times a day (QID) | ORAL | Status: DC | PRN

## 2016-05-04 MED ORDER — MEPERIDINE HCL 25 MG/ML IJ SOLN
INTRAMUSCULAR | Status: DC | PRN
  Administered 2016-05-04: 12.5 mg via INTRAVENOUS

## 2016-05-04 MED ORDER — DIPHENHYDRAMINE HCL 50 MG/ML IJ SOLN: 12.5000 mg | INTRAMUSCULAR | Status: DC | PRN

## 2016-05-04 MED ORDER — PHENYLEPHRINE HCL 10 MG/ML IJ SOLN
INTRAMUSCULAR | Status: DC | PRN
  Administered 2016-05-04 (×2): 40 ug via INTRAVENOUS

## 2016-05-04 MED ORDER — KETOROLAC TROMETHAMINE 30 MG/ML IJ SOLN: 30.0000 mg | Freq: Four times a day (QID) | INTRAMUSCULAR | Status: DC | PRN

## 2016-05-04 MED ORDER — PHENYLEPHRINE 40 MCG/ML (10ML) SYRINGE FOR IV PUSH (FOR BLOOD PRESSURE SUPPORT)
PREFILLED_SYRINGE | INTRAVENOUS | Status: AC
  Filled 2016-05-04: qty 10

## 2016-05-04 MED ORDER — COCONUT OIL OIL: 1.0000 "application " | TOPICAL_OIL | Status: DC | PRN

## 2016-05-04 MED ORDER — DIPHENHYDRAMINE HCL 25 MG PO CAPS
25.0000 mg | ORAL_CAPSULE | ORAL | Status: DC | PRN
  Filled 2016-05-04: qty 1

## 2016-05-04 MED ORDER — MEPERIDINE HCL 25 MG/ML IJ SOLN: 6.2500 mg | INTRAMUSCULAR | Status: DC | PRN

## 2016-05-04 MED ORDER — SODIUM CHLORIDE 0.9% FLUSH: 3.0000 mL | INTRAVENOUS | Status: DC | PRN

## 2016-05-04 MED ORDER — OXYTOCIN 10 UNIT/ML IJ SOLN
INTRAVENOUS | Status: DC | PRN
  Administered 2016-05-04: 40 [IU] via INTRAVENOUS

## 2016-05-04 MED ORDER — OXYTOCIN 10 UNIT/ML IJ SOLN
INTRAMUSCULAR | Status: AC
  Filled 2016-05-04: qty 4

## 2016-05-04 MED ORDER — ZOLPIDEM TARTRATE 5 MG PO TABS: 5.0000 mg | ORAL_TABLET | Freq: Every evening | ORAL | Status: DC | PRN

## 2016-05-04 MED ORDER — LACTATED RINGERS IV SOLN
INTRAVENOUS | Status: DC | PRN
  Administered 2016-05-04: 09:00:00 via INTRAVENOUS

## 2016-05-04 MED ORDER — KETOROLAC TROMETHAMINE 30 MG/ML IJ SOLN: 30.0000 mg | Freq: Once | INTRAMUSCULAR | Status: DC

## 2016-05-04 MED ORDER — ONDANSETRON HCL 4 MG/2ML IJ SOLN
INTRAMUSCULAR | Status: AC
  Filled 2016-05-04: qty 2

## 2016-05-04 MED ORDER — KETOROLAC TROMETHAMINE 30 MG/ML IJ SOLN
INTRAMUSCULAR | Status: AC
  Administered 2016-05-04: 30 mg
  Filled 2016-05-04: qty 1

## 2016-05-04 MED ORDER — NALBUPHINE HCL 10 MG/ML IJ SOLN: 5.0000 mg | INTRAMUSCULAR | Status: DC | PRN

## 2016-05-04 MED ORDER — LACTATED RINGERS IV SOLN
INTRAVENOUS | Status: DC
  Administered 2016-05-04 – 2016-05-05 (×2): via INTRAVENOUS

## 2016-05-04 MED ORDER — NALBUPHINE HCL 10 MG/ML IJ SOLN: 5.0000 mg | Freq: Once | INTRAMUSCULAR | Status: DC | PRN

## 2016-05-04 MED ORDER — SCOPOLAMINE 1 MG/3DAYS TD PT72
MEDICATED_PATCH | TRANSDERMAL | Status: AC
  Filled 2016-05-04: qty 1

## 2016-05-04 MED ORDER — WITCH HAZEL-GLYCERIN EX PADS: 1.0000 "application " | MEDICATED_PAD | CUTANEOUS | Status: DC | PRN

## 2016-05-04 MED ORDER — PRENATAL MULTIVITAMIN CH
1.0000 | ORAL_TABLET | Freq: Every day | ORAL | Status: DC
  Administered 2016-05-04 – 2016-05-07 (×4): 1 via ORAL
  Filled 2016-05-04 (×4): qty 1

## 2016-05-04 MED ORDER — MORPHINE SULFATE (PF) 0.5 MG/ML IJ SOLN
INTRAMUSCULAR | Status: AC
  Filled 2016-05-04: qty 10

## 2016-05-04 MED ORDER — MEPERIDINE HCL 25 MG/ML IJ SOLN
INTRAMUSCULAR | Status: AC
  Filled 2016-05-04: qty 1

## 2016-05-04 MED ORDER — SENNOSIDES-DOCUSATE SODIUM 8.6-50 MG PO TABS
2.0000 | ORAL_TABLET | ORAL | Status: DC
  Administered 2016-05-05 – 2016-05-07 (×3): 2 via ORAL
  Filled 2016-05-04 (×3): qty 2

## 2016-05-04 MED ORDER — NALOXONE HCL 0.4 MG/ML IJ SOLN: 0.4000 mg | INTRAMUSCULAR | Status: DC | PRN

## 2016-05-04 MED ORDER — ERYTHROMYCIN 5 MG/GM OP OINT
TOPICAL_OINTMENT | OPHTHALMIC | Status: AC
  Filled 2016-05-04: qty 1

## 2016-05-04 MED ORDER — SIMETHICONE 80 MG PO CHEW
80.0000 mg | CHEWABLE_TABLET | ORAL | Status: DC
  Administered 2016-05-05 – 2016-05-07 (×3): 80 mg via ORAL
  Filled 2016-05-04 (×3): qty 1

## 2016-05-04 MED ORDER — OXYTOCIN 40 UNITS IN LACTATED RINGERS INFUSION - SIMPLE MED: 2.5000 [IU]/h | INTRAVENOUS | Status: AC

## 2016-05-04 MED ORDER — ACETAMINOPHEN 500 MG PO TABS
1000.0000 mg | ORAL_TABLET | Freq: Four times a day (QID) | ORAL | Status: AC
  Administered 2016-05-04 – 2016-05-05 (×4): 1000 mg via ORAL
  Filled 2016-05-04 (×4): qty 2

## 2016-05-04 SURGICAL SUPPLY — 34 items
BENZOIN TINCTURE PRP APPL 2/3 (GAUZE/BANDAGES/DRESSINGS) ×3 IMPLANT
CHLORAPREP W/TINT 26ML (MISCELLANEOUS) ×3 IMPLANT
CLAMP CORD UMBIL (MISCELLANEOUS) IMPLANT
CLOSURE STERI STRIP 1/2 X4 (GAUZE/BANDAGES/DRESSINGS) ×3 IMPLANT
CLOTH BEACON ORANGE TIMEOUT ST (SAFETY) ×3 IMPLANT
DRAIN JACKSON PRT FLT 7MM (DRAIN) IMPLANT
DRSG OPSITE POSTOP 4X10 (GAUZE/BANDAGES/DRESSINGS) ×3 IMPLANT
ELECT REM PT RETURN 9FT ADLT (ELECTROSURGICAL) ×3
ELECTRODE REM PT RTRN 9FT ADLT (ELECTROSURGICAL) ×1 IMPLANT
EVACUATOR SILICONE 100CC (DRAIN) IMPLANT
EXTRACTOR VACUUM M CUP 4 TUBE (SUCTIONS) IMPLANT
EXTRACTOR VACUUM M CUP 4' TUBE (SUCTIONS)
GLOVE BIO SURGEON STRL SZ7 (GLOVE) ×3 IMPLANT
GLOVE BIOGEL PI IND STRL 7.0 (GLOVE) ×2 IMPLANT
GLOVE BIOGEL PI INDICATOR 7.0 (GLOVE) ×4
GOWN STRL REUS W/TWL LRG LVL3 (GOWN DISPOSABLE) ×6 IMPLANT
KIT ABG SYR 3ML LUER SLIP (SYRINGE) IMPLANT
NEEDLE HYPO 25X5/8 SAFETYGLIDE (NEEDLE) ×3 IMPLANT
NS IRRIG 1000ML POUR BTL (IV SOLUTION) ×3 IMPLANT
PACK C SECTION WH (CUSTOM PROCEDURE TRAY) ×3 IMPLANT
PAD ABD 7.5X8 STRL (GAUZE/BANDAGES/DRESSINGS) ×3 IMPLANT
PAD OB MATERNITY 4.3X12.25 (PERSONAL CARE ITEMS) ×3 IMPLANT
PENCIL SMOKE EVAC W/HOLSTER (ELECTROSURGICAL) ×3 IMPLANT
RTRCTR C-SECT PINK 25CM LRG (MISCELLANEOUS) ×3 IMPLANT
SPONGE GAUZE 4X4 12PLY STER LF (GAUZE/BANDAGES/DRESSINGS) ×6 IMPLANT
SPONGE LAP 18X18 X RAY DECT (DISPOSABLE) ×3 IMPLANT
SUT MNCRL 0 VIOLET CTX 36 (SUTURE) ×1 IMPLANT
SUT MONOCRYL 0 CTX 36 (SUTURE) ×2
SUT VIC AB 0 CTX 36 (SUTURE) ×10
SUT VIC AB 0 CTX36XBRD ANBCTRL (SUTURE) ×5 IMPLANT
SUT VIC AB 4-0 KS 27 (SUTURE) ×3 IMPLANT
SYR KIT LINE DRAW 1CC W/FILTR (LINER) ×3 IMPLANT
TOWEL OR 17X24 6PK STRL BLUE (TOWEL DISPOSABLE) ×3 IMPLANT
TRAY FOLEY CATH SILVER 14FR (SET/KITS/TRAYS/PACK) ×3 IMPLANT

## 2016-05-04 NOTE — Brief Op Note (Signed)
05/02/2016 - 05/04/2016  9:46 AM  PATIENT:  Deborah RushingAna Steele  21 y.o. female  PRE-OPERATIVE DIAGNOSIS:  arrest of dilation  POST-OPERATIVE DIAGNOSIS:  arrest of dilatation and asynclitism  PROCEDURE:  Procedure(s): CESAREAN SECTION (N/A)  SURGEON:  Surgeon(s) and Role:    * Lesly DukesKelly H Videl Nobrega, MD - Primary    * Hiram ComberElizabeth Woodland Mumaw, DO - Fellow  ANESTHESIA:   epidural  EBL:  Total I/O In: 144 [I.V.:144] Out: 1325 [Urine:825; Blood:500]  BLOOD ADMINISTERED:none  DRAINS: none   LOCAL MEDICATIONS USED:  NONE  SPECIMEN:  Source of Specimen:  placenta  DISPOSITION OF SPECIMEN:  PATHOLOGY  COUNTS:  YES  TOURNIQUET:  * No tourniquets in log *  DICTATION: .Note written in EPIC  PLAN OF CARE: Admit to inpatient   PATIENT DISPOSITION:  PACU - hemodynamically stable.   Delay start of Pharmacological VTE agent (>24hrs) due to surgical blood loss or risk of bleeding: not applicable

## 2016-05-04 NOTE — Progress Notes (Signed)
Deborah Steele is a 21 y.o. G1P1000 at 822w1d with severe pre eclampsia  Subjective: Pt getting more comfortable with epidural bolus.    Objective: BP 140/78   Pulse 88   Temp 98.9 F (37.2 C) (Oral)   Resp 18   Ht 5\' 2"  (1.575 m)   Wt 210 lb (95.3 kg)   LMP 07/17/2015   SpO2 100%   Breastfeeding? Unknown   BMI 38.41 kg/m  I/O last 3 completed shifts: In: 7339.8 [P.O.:2340; I.V.:4929.8; Other:70] Out: 5400 [Urine:5400] Total I/O In: -  Out: 625 [Urine:625]  SVE:   Dilation: 6 Effacement (%): 90 Station: -1 Exam by:: Smt Lokey  Labs: Lab Results  Component Value Date   WBC 14.8 (H) 05/03/2016   HGB 12.6 05/03/2016   HCT 35.8 (L) 05/03/2016   MCV 84.6 05/03/2016   PLT 252 05/03/2016    Assessment / Plan: Pt is 6 cm for 16 hours and ruptured.  There have been periods of adequacy.  Pt meets criteria for failure to dilate.  Fetal well being--category 1.  The risks of cesarean section discussed with the patient included but were not limited to: bleeding which may require transfusion or reoperation; infection which may require antibiotics; injury to bowel, bladder, ureters or other surrounding organs; injury to the fetus; need for additional procedures including hysterectomy in the event of a life-threatening hemorrhage; placental abnormalities wth subsequent pregnancies, incisional problems, thromboembolic phenomenon and other postoperative/anesthesia complications. The patient concurred with the proposed plan, giving informed written consent for the procedure.

## 2016-05-04 NOTE — Transfer of Care (Signed)
Immediate Anesthesia Transfer of Care Note  Patient: Deborah RushingAna Goodine  Procedure(s) Performed: Procedure(s): CESAREAN SECTION (N/A)  Patient Location: PACU  Anesthesia Type:Epidural  Level of Consciousness: awake and alert   Airway & Oxygen Therapy: Patient Spontanous Breathing  Post-op Assessment: Report given to RN and Post -op Vital signs reviewed and stable  Post vital signs: Reviewed  Last Vitals:  Vitals:   05/04/16 0811 05/04/16 0815  BP: 138/77 140/78  Pulse: 93 88  Resp: 18   Temp: 37.2 C     Last Pain:  Vitals:   05/04/16 0811  TempSrc: Oral  PainSc:       Patients Stated Pain Goal: 5 (05/02/16 1228)  Complications: No apparent anesthesia complications

## 2016-05-04 NOTE — Progress Notes (Signed)
Patient ID: Deborah Steele, female   DOB: 03-May-1995, 21 y.o.   MRN: [id number]  [name] seen & examined for progress of labor. Patient comfortable with epidural. Headache has gone away.   O:  Vitals:   05/04/16 0431 05/04/16 0501 05/04/16 0531 05/04/16 0600  BP: 122/81 123/70 139/79 (!) 158/96  Pulse: 96 92 (!) 102 96  Resp: 18 16 18 18   Temp:   98.2 F (36.8 C)   TempSrc:   Axillary   SpO2:      Weight:      Height:        Dilation: 6 Effacement (%): 90 Cervical Position: Middle Station: -1 Presentation: Vertex Exam by:: Dr. Omer JackMumaw   FHT: 120 bpm, mod var, +accels, no decels IUPC: 155 MVU, inadequate, irregular contraction pattern   A/P: Continue with pitocin increase Discussed with Dr. Shawnie PonsPratt who would like to proceed with IOL, patient still has not become actively in labor. Continue expectant management Anticipate SVD

## 2016-05-04 NOTE — Op Note (Signed)
Deborah Steele PROCEDURE DATE: 05/02/2016 - 05/04/2016  PREOPERATIVE DIAGNOSIS: Intrauterine pregnancy at  3117w3d weeks gestation with failure to dilate  POSTOPERATIVE DIAGNOSIS: Intrauterine pregnancy at  1317w3d weeks gestation with failure to dilate and asynclitism  PROCEDURE:    Low Transverse Cesarean Section  SURGEON:  Dr. Elsie LincolnKelly Burdell Peed  ASSISTANT: Dr. Gailen ShelterBeth Mumaw  INDICATIONS: Deborah Steele is a 21 y.o. G1P1000 at 6617w3d with severe preeclampsia.  The risks of cesarean section discussed with the patient included but were not limited to: bleeding which may require transfusion or reoperation; infection which may require antibiotics; injury to bowel, bladder, ureters or other surrounding organs; injury to the fetus; need for additional procedures including hysterectomy in the event of a life-threatening hemorrhage; placental abnormalities wth subsequent pregnancies, incisional problems, thromboembolic phenomenon and other postoperative/anesthesia complications. The patient concurred with the proposed plan, giving informed written consent for the procedure.    FINDINGS:  Viable female  infant in cephalic presentation, 8,9 Apgars, weight to be determined in 1 hour, clear amniotic fluid.  Intact placenta, three vessel cord.  Grossly normal uterus, ovaries and fallopian tubes. .   ANESTHESIA:    Epidural   ESTIMATED BLOOD LOSS: 500cc  SPECIMENS: Placenta sent to pathology  COMPLICATIONS: None immediate  PROCEDURE IN DETAIL:  The patient received intravenous antibiotics and had sequential compression devices applied to her lower extremities.  Epidural anesthesia was dosed up to surgical level) and was found to be adequate. She was then placed in a dorsal supine position with a leftward tilt, and prepped and draped in a sterile manner.  A foley catheter was placed into her bladder and attached to constant gravity.  After an adequate timeout was performed, a Pfannenstiel skin incision was made with scalpel  and carried through to the underlying layer of fascia. The fascia was incised in the midline and this incision was extended bilaterally using the Mayo scissors. Kocher clamps were applied to the superior aspect of the fascial incision and the underlying rectus muscles were dissected off bluntly. A similar process was carried out on the inferior aspect of the facial incision. The rectus muscles were separated in the midline bluntly and the peritoneum was entered bluntly.   A transverse hysterotomy was made with a scalpel and extended bilaterally bluntly. The bladder blade was then removed. The infant was successfully delivered, and cord was clamped and cut and infant was handed over to awaiting neonatology team. Uterine massage was then administered and the placenta delivered intact with three-vessel cord. The uterus was cleared of clot and debris.  The hysterotomy was closed with 0 vicryl.  A second imbricating suture of 0-Monocryl was used to reinforce the incision and aid in hemostasis.  The peritoneum and rectus muscles were noted to be hemostatic.  Peritoneum was closed with 0-Vicryl  The fascia was closed with 0-Vicryl in a running fashion with good restoration of anatomy.  The subcutaneus tissue was copiously irrigated.  The skin was closed with 4-0 Vicryl in a subcuticular fashion.    Pt tolerated the procedure will.  All counts were correct x2.  Pt went to the recovery room in stable condition.

## 2016-05-04 NOTE — Anesthesia Postprocedure Evaluation (Signed)
Anesthesia Post Note  Patient: Scientist, water qualityAna Steele  Procedure(s) Performed: Procedure(s) (LRB): CESAREAN SECTION (N/A)  Patient location during evaluation: PACU Anesthesia Type: Epidural Level of consciousness: awake Pain management: pain level controlled Vital Signs Assessment: post-procedure vital signs reviewed and stable Respiratory status: spontaneous breathing Postop Assessment: no headache, no backache, epidural receding, patient able to bend at knees and no signs of nausea or vomiting Anesthetic complications: no        Last Vitals:  Vitals:   05/04/16 1206 05/04/16 1300  BP: 138/84   Pulse: 88   Resp: 20 18  Temp: 36.7 C     Last Pain:  Vitals:   05/04/16 1300  TempSrc:   PainSc: 3    Pain Goal: Patients Stated Pain Goal: 5 (05/04/16 1300)               Egidio Lofgren JR,JOHN Susann GivensFRANKLIN

## 2016-05-05 LAB — CBC
HCT: 30.8 % — ABNORMAL LOW (ref 36.0–46.0)
Hemoglobin: 10.7 g/dL — ABNORMAL LOW (ref 12.0–15.0)
MCH: 29.6 pg (ref 26.0–34.0)
MCHC: 34.7 g/dL (ref 30.0–36.0)
MCV: 85.1 fL (ref 78.0–100.0)
Platelets: 198 10*3/uL (ref 150–400)
RBC: 3.62 MIL/uL — ABNORMAL LOW (ref 3.87–5.11)
RDW: 16.1 % — AB (ref 11.5–15.5)
WBC: 12.7 10*3/uL — ABNORMAL HIGH (ref 4.0–10.5)

## 2016-05-05 MED ORDER — OXYCODONE HCL 5 MG PO TABS
10.0000 mg | ORAL_TABLET | ORAL | Status: DC | PRN
Start: 2016-05-05 — End: 2016-05-07
  Administered 2016-05-06 – 2016-05-07 (×4): 10 mg via ORAL
  Filled 2016-05-05 (×4): qty 2

## 2016-05-05 MED ORDER — OXYCODONE HCL 5 MG PO TABS
5.0000 mg | ORAL_TABLET | ORAL | Status: DC | PRN
Start: 2016-05-05 — End: 2016-05-07
  Administered 2016-05-05 – 2016-05-06 (×4): 5 mg via ORAL
  Filled 2016-05-05 (×5): qty 1

## 2016-05-05 NOTE — Lactation Note (Deleted)
This note was copied from a baby's chart. Lactation Consultation Note  Patient Name: Boy Pennie Rushingna Bocanegra UJWJX'BToday's Date: 05/05/2016 Reason for consult: Initial assessment    With this mom of a term baby, now 6229 hours old, and full term.Mom came in formula feeding, but has been latching. She reports latching painful, so I told her to call me with next latch. I asisted mom with latching baby. She latches well, and it appears deep, but mom reports discomfort. I then assisted her with latching with 24 nipple shield, and baby was able to latch deeper, good breast movement, and mom reports latch comfortable.  I did suck training with the baby with a gloved finger, and although she has a strong suck, her tongue was barely over her bottom gum line. On exam of her lingual frenulum, it is posterior, short, white  with tongue elevation, and a lot of thick tissue noted behind the frenulum. I showed these findings to mom, and explained that if her baby was not able to get her tongue to form a vacuum around her breast, it causes the baby to slip to her nipple, and cause her discomfort. Mom very receptive to teaching, and was shown how to use and care for the nipple shield, and how to apply. Mom knows to call for questions/concerns.  Maternal Data Formula Feeding for Exclusion: Yes Reason for exclusion: Mother's choice to formula and breast feed on admission Has patient been taught Hand Expression?: Yes Does the patient have breastfeeding experience prior to this delivery?: Yes  Feeding Feeding Type: Breast Fed Nipple Type: Slow - flow Length of feed:  (baby latched for at least 20 minutes, still altched when I left the room)  LATCH Score/Interventions Latch: Repeated attempts needed to sustain latch, nipple held in mouth throughout feeding, stimulation needed to elicit sucking reflex.  Audible Swallowing: A few with stimulation  Type of Nipple: Everted at rest and after stimulation  Comfort (Breast/Nipple):  Filling, red/small blisters or bruises, mild/mod discomfort  Problem noted: Mild/Moderate discomfort  Hold (Positioning): Assistance needed to correctly position infant at breast and maintain latch. Intervention(s): Breastfeeding basics reviewed;Support Pillows;Position options;Skin to skin  LATCH Score: 6  Lactation Tools Discussed/Used Tools: Nipple Shields Nipple shield size: 24   Consult Status Consult Status: Follow-up Date: 05/06/16 Follow-up type: In-patient    Alfred LevinsLee, Curley Hogen Anne 05/05/2016, 3:31 PM

## 2016-05-05 NOTE — Anesthesia Postprocedure Evaluation (Signed)
Anesthesia Post Note  Patient: Scientist, water qualityAna Steele  Procedure(s) Performed: Procedure(s) (LRB): CESAREAN SECTION (N/A)  Patient location during evaluation: [facility] Anesthesia Type: Epidural Level of consciousness: awake, awake and alert and oriented Pain management: pain level controlled Vital Signs Assessment: post-procedure vital signs reviewed and stable Respiratory status: spontaneous breathing and nonlabored ventilation Cardiovascular status: stable Postop Assessment: no headache, no backache, epidural receding, patient able to bend at knees, no signs of nausea or vomiting and adequate PO intake Anesthetic complications: no        Last Vitals:  Vitals:   05/05/16 0505 05/05/16 0802  BP: 119/77 129/72  Pulse: 86 79  Resp: 16 18  Temp:  36.7 C    Last Pain:  Vitals:   05/05/16 0802  TempSrc: Oral  PainSc:    Pain Goal: Patients Stated Pain Goal: 5 (05/04/16 1900)               Laban EmperorMalinova,[name]

## 2016-05-05 NOTE — Progress Notes (Signed)
Subjective: Postpartum Day 1: Cesarean Delivery Patient has no complaints today. Feels better since magnesium has been d/c. Denies HA or visual changes. Up to restroom without problems Pain controlled. Bottle feeding     Objective: Vital signs in last 24 hours: Temp:  [98.1 F (36.7 C)-98.7 F (37.1 C)] 98.4 F (36.9 C) (02/12 1153) Pulse Rate:  [79-87] 87 (02/12 1153) Resp:  [16-18] 18 (02/12 1153) BP: (114-134)/(63-78) 121/76 (02/12 1153) SpO2:  [95 %-98 %] 97 % (02/12 1153)  Physical Exam:  Lungs clear Heart RRR Abd soft + BS drsg intact Lochia min Ext trace edema, DTR's + 2 no clonus  Recent Labs  05/03/16 1440 05/05/16 0558  HGB 12.6 10.7*  HCT 35.8* 30.8*    Assessment/Plan: POD # 1 LTCS Severe PEC s/p magnesium  Doing well. BP stable without meds. Will monitor Continue with progressive care.  Hermina StaggersMichael L Ervin 05/05/2016, 12:42 PM

## 2016-05-05 NOTE — Addendum Note (Signed)
Addendum  created 05/05/16 [id number] by Elgie CongoNataliya H Lindsay Soulliere, CRNA   Sign clinical note

## 2016-05-06 MED ORDER — AMLODIPINE BESYLATE 5 MG PO TABS
5.0000 mg | ORAL_TABLET | Freq: Every day | ORAL | Status: DC
  Administered 2016-05-06: 5 mg via ORAL
  Filled 2016-05-06: qty 1

## 2016-05-06 NOTE — Lactation Note (Signed)
This note was copied from a baby's chart. Lactation Consultation Note  Patient Name: Deborah Steele NWGNF'AToday's Date: 05/06/2016 Reason for consult: Follow-up assessment Mom has not had any breastfeeding attempts.  Baby has been formula feeding.  I asked mother if she would like to breastfeed.  She states I will try it and see what it feels like.  I explained to mom that breastfeeding is something they both need to learn and it is a process that may take time to become efficient.  Mom agreed to allowing me to assist her.  Baby positioned skin to skin in football hold.  Mom has firm breasts and short nipples.  Baby unable to grasp breast so a 20 mm nipple shield applied.  Baby did latch with shield well but very sleepy during feeding.  Only a few swallows audible.  Mom taught waking techniques and breast massage and compression.  I told parents that baby would probably want supplement after breastfeeding since he was use to a full stomach.  I left my number on board and instructed mom to call me for assist prn.  She states I think I've got this.  Maternal Data    Feeding Feeding Type: Breast Fed Length of feed: 10 min  LATCH Score/Interventions Latch: Grasps breast easily, tongue down, lips flanged, rhythmical sucking. (with 20 mm nipple shield) Intervention(s): Adjust position;Assist with latch;Breast massage;Breast compression  Audible Swallowing: A few with stimulation Intervention(s): Skin to skin;Hand expression;Alternate breast massage  Type of Nipple: Everted at rest and after stimulation  Comfort (Breast/Nipple): Soft / non-tender     Hold (Positioning): Assistance needed to correctly position infant at breast and maintain latch. Intervention(s): Breastfeeding basics reviewed;Support Pillows;Position options;Skin to skin  LATCH Score: 8  Lactation Tools Discussed/Used Tools: Nipple Shields Nipple shield size: 20   Consult Status Consult Status: PRN Date: 05/07/16 Follow-up  type: In-patient    Huston FoleyMOULDEN, Lakhia Gengler S 05/06/2016, 2:12 PM

## 2016-05-06 NOTE — Progress Notes (Addendum)
Subjective: Postpartum Day #2: Cesarean Delivery Patient reports incisional pain, tolerating PO, + flatus and no problems voiding.    Objective: Vital signs in last 24 hours: Temp:  [98.1 F (36.7 C)-99.8 F (37.7 C)] 98.1 F (36.7 C) (02/13 0404) Pulse Rate:  [70-89] 89 (02/13 0404) Resp:  [16-18] 16 (02/13 0404) BP: (121-142)/(72-85) 132/82 (02/13 0404) SpO2:  [97 %-100 %] 100 % (02/13 0404)  Physical Exam:  General: alert, cooperative and no distress Lochia: appropriate Uterine Fundus: firm Incision: no dehiscence, no significant erythema, pressure dressing removed, honey comb dressing change ordered.  DVT Evaluation: No evidence of DVT seen on physical exam. No cords or calf tenderness. No significant calf/ankle edema.   Recent Labs  05/03/16 1440 05/05/16 0558  HGB 12.6 10.7*  HCT 35.8* 30.8*    Assessment/Plan: Status post Cesarean section. Doing well postoperatively. Blood pressures stable: 1 elevated at 142/85.   Continue current care.  Change honey comb dressing.  Abdominal binder ordered.   Deborah Steele, CNM 05/06/2016, 7:29 AM

## 2016-05-07 ENCOUNTER — Encounter: Payer: Medicaid Other | Admitting: Obstetrics & Gynecology

## 2016-05-07 MED ORDER — SENNOSIDES-DOCUSATE SODIUM 8.6-50 MG PO TABS: 2.0000 | ORAL_TABLET | Freq: Two times a day (BID) | ORAL | 2 refills | Status: DC

## 2016-05-07 MED ORDER — AMLODIPINE BESYLATE 10 MG PO TABS: 10.0000 mg | ORAL_TABLET | Freq: Every day | ORAL | 5 refills | Status: DC

## 2016-05-07 MED ORDER — AMLODIPINE BESYLATE 10 MG PO TABS
10.0000 mg | ORAL_TABLET | Freq: Every day | ORAL | Status: DC
  Administered 2016-05-07: 10 mg via ORAL
  Filled 2016-05-07: qty 1

## 2016-05-07 MED ORDER — IBUPROFEN 600 MG PO TABS: 600.0000 mg | ORAL_TABLET | Freq: Four times a day (QID) | ORAL | 2 refills | Status: DC | PRN

## 2016-05-07 MED ORDER — OXYCODONE HCL 10 MG PO TABS: 10.0000 mg | ORAL_TABLET | ORAL | 0 refills | Status: DC | PRN

## 2016-05-07 NOTE — Progress Notes (Signed)
Subjective: Postpartum Day #3: Cesarean Delivery Patient reports incisional pain, tolerating PO, + flatus and no problems voiding.    Objective: Vital signs in last 24 hours: Temp:  [98.4 F (36.9 C)-100 F (37.8 C)] 99.3 F (37.4 C) (02/14 0102) Pulse Rate:  [81-99] 99 (02/14 0102) Resp:  [18] 18 (02/14 0102) BP: (134-159)/(81-92) 159/86 (02/14 0102) SpO2:  [98 %-100 %] 100 % (02/14 0102)  Physical Exam:  General: alert, cooperative and no distress Lochia: appropriate Uterine Fundus: firm Incision: healing well, no significant drainage, no dehiscence, no significant erythema DVT Evaluation: No evidence of DVT seen on physical exam. No cords or calf tenderness. No significant calf/ankle edema.   Recent Labs  05/05/16 0558  HGB 10.7*  HCT 30.8*    Assessment/Plan: Status post Cesarean section. Doing well postoperatively.  Discharge home with standard precautions and return to clinic in 4-6 weeks.  Baby love nurse visit for blood pressure check.   Roe Coombsachelle A Dionne Knoop, CNM 05/07/2016, 7:57 AM

## 2016-05-07 NOTE — Discharge Summary (Signed)
OB Discharge Summary     Patient Name: Deborah Steele DOB: 1995/05/24 MRN: [id number]  Date of admission: 05/02/2016 Delivering MD: Elsie LincolnLEGGETT, KELLY H   Date of discharge: 05/07/2016  Admitting diagnosis: INDUCTION Intrauterine pregnancy: 2141w3d     Secondary diagnosis:  Active Problems:   Gestational hypertension   Preeclampsia, third trimester  Additional problems: none     Discharge diagnosis: Term Pregnancy Delivered, Gestational Hypertension and Preeclampsia (severe)                                                                                                Post partum procedures:none  Augmentation: AROM, Pitocin, Cytotec and Foley Balloon  Complications: None  Hospital course:  Induction of Labor With Cesarean Section  21 y.o. yo G1P1000 at 1941w3d was admitted to the hospital 05/02/2016 for induction of labor. Patient had a labor course significant for severe preeclampsia. The patient went for cesarean section due to Arrest of Dilation and Arrest of Descent, and delivered a Viable infant,@BABYSUPPRESS (DBLINK,ept,110,,1,,) Membrane Rupture Time/Date: )1:44 PM ,05/03/2016   @Details  of operation can be found in separate operative Note.  Patient had an uncomplicated postpartum course. She is ambulating, tolerating a regular diet, passing flatus, and urinating well.  Patient is discharged home in stable condition on 05/07/16.                                    Physical exam  Vitals:   05/06/16 1515 05/06/16 1845 05/06/16 2013 05/07/16 0102  BP: 134/81 (!) 156/90 (!) 141/89 (!) 159/86  Pulse: 81 88 88 99  Resp: 18  18 18   Temp: 98.9 F (37.2 C)  100 F (37.8 C) 99.3 F (37.4 C)  TempSrc: Oral  Oral Oral  SpO2: 100%  99% 100%  Weight:      Height:       General: alert, cooperative and no distress Lochia: appropriate Uterine Fundus: firm Incision: Healing well with no significant drainage, No significant erythema, Dressing is clean, dry, and intact DVT Evaluation: No  evidence of DVT seen on physical exam. No cords or calf tenderness. No significant calf/ankle edema. Labs: Lab Results  Component Value Date   WBC 12.7 (H) 05/05/2016   HGB 10.7 (L) 05/05/2016   HCT 30.8 (L) 05/05/2016   MCV 85.1 05/05/2016   PLT 198 05/05/2016   CMP Latest Ref Rng & Units 05/02/2016  Glucose 65 - 99 mg/dL 73  BUN 6 - 20 mg/dL 10  Creatinine 7.820.44 - 9.561.00 mg/dL 2.130.63  Sodium 086135 - 578145 mmol/L 135  Potassium 3.5 - 5.1 mmol/L 4.2  Chloride 101 - 111 mmol/L 107  CO2 22 - 32 mmol/L 20(L)  Calcium 8.9 - 10.3 mg/dL 4.6(N8.7(L)  Total Protein 6.5 - 8.1 g/dL 6.2(L)  Total Bilirubin 0.3 - 1.2 mg/dL 0.3  Alkaline Phos 38 - 126 U/L 163(H)  AST 15 - 41 U/L 19  ALT 14 - 54 U/L 12(L)    Discharge instruction: per After Visit Summary and "Baby and Me Booklet".  After  visit meds:  Allergies as of 05/07/2016   No Known Allergies     Medication List    TAKE these medications   acetaminophen 500 MG tablet Commonly known as:  TYLENOL Take 500 mg by mouth every 6 (six) hours as needed for headache.   amLODipine 10 MG tablet Commonly known as:  NORVASC Take 1 tablet (10 mg total) by mouth daily.   ibuprofen 600 MG tablet Commonly known as:  ADVIL,MOTRIN Take 1 tablet (600 mg total) by mouth every 6 (six) hours as needed for mild pain.   Oxycodone HCl 10 MG Tabs Take 1 tablet (10 mg total) by mouth every 4 (four) hours as needed (pain score >/=7).   prenatal multivitamin Tabs tablet Take 1 tablet by mouth daily at 12 noon.   senna-docusate 8.6-50 MG tablet Commonly known as:  Senokot-S Take 2 tablets by mouth 2 (two) times daily.       Diet: low salt diet  Activity: Advance as tolerated. Pelvic rest for 6 weeks.   Outpatient follow NW:GNFAO pressure check Friday, baby love visit in 2 weeks, f/u 6 weeks for postpartum visit Follow up Appt: Future Appointments Date Time Provider Department Center  06/16/2016 8:40 AM Marny Lowenstein, PA-C [facility]   Follow up  Visit:No Follow-up on file.  Postpartum contraception: Condoms and possible IUD discussed.   Newborn Data: Live born female  Birth Weight: 7 lb 2.6 oz (3250 g) APGAR: 8, 9  Baby Feeding: Bottle and Breast Disposition:home with mother   05/07/2016 Roe Coombs, CNM

## 2016-05-07 NOTE — Progress Notes (Signed)
Discharge teaching complete with pt. Pt understood all instructions and did not have any questions. Pt discharged home to family.  

## 2016-05-07 NOTE — Lactation Note (Signed)
This note was copied from a baby's chart. Lactation Consultation Note  Mom formula feeding and denies needing to see lactation prior to discharge today.  Patient Name: Deborah Steele ZOXWR'UToday's Date: 05/07/2016     Maternal Data    Feeding Feeding Type: Bottle Fed - Formula Nipple Type: Slow - flow  LATCH Score/Interventions                      Lactation Tools Discussed/Used     Consult Status Consult Status: Complete Follow-up type: Call as needed    Alfred LevinsLee, Amish Mintzer Anne 05/07/2016, 10:17 AM

## 2016-05-09 ENCOUNTER — Ambulatory Visit: Payer: Medicaid Other | Admitting: *Deleted

## 2016-05-09 VITALS — BP 116/77

## 2016-05-09 DIAGNOSIS — Z0130 Encounter for examination of blood pressure without abnormal findings: Secondary | ICD-10-CM

## 2016-05-09 NOTE — Progress Notes (Signed)
Pt had csection on 05/04/16. She had preeclampsia. She denies any headaches or swelling. She was discharged on 2/14 with Norvasc 10 mg daily. She has not taken her dose today. BP in office is 116/77.

## 2016-05-23 ENCOUNTER — Ambulatory Visit (INDEPENDENT_AMBULATORY_CARE_PROVIDER_SITE_OTHER): Payer: Medicaid Other | Admitting: Obstetrics and Gynecology

## 2016-05-23 DIAGNOSIS — T148XXA Other injury of unspecified body region, initial encounter: Secondary | ICD-10-CM | POA: Insufficient documentation

## 2016-05-23 MED ORDER — CEPHALEXIN 500 MG PO CAPS: 500.0000 mg | ORAL_CAPSULE | Freq: Three times a day (TID) | ORAL | 0 refills | Status: DC

## 2016-05-23 NOTE — Progress Notes (Signed)
Patient here for wound check today. Patient reports slight odor and some redness on one side since this week. C-section 2/11.   Ms Deborah Steele presents with c/o burning, string and discharge at her c section incision. Noted this week. No F/C. Denies any bowel or bladder dysfunction. She is breast and bottle feeding.  PEC vs GHTN controlled with Norvasc.  PE AF VSS Lungs clear Heart RRR Abd soft + BS slightly obese  Incision with small pen head opening with min purulent discharge expressed, probed, min depth and remainder of incision intact.  A/P Wound discharge ? Early infection        HTN  Wound care reviewed with pt. Will start Keflex 500 mg tid x 7 days. Continue with Norvasc. F/U with PP visit

## 2016-06-01 ENCOUNTER — Inpatient Hospital Stay (HOSPITAL_COMMUNITY)
Admission: AD | Admit: 2016-06-01 | Discharge: 2016-06-01 | Disposition: A | Discharge status: Home / Self Care | Payer: Commercial Managed Care - PPO | Source: Ambulatory Visit | Attending: Obstetrics & Gynecology | Admitting: Obstetrics & Gynecology

## 2016-06-01 ENCOUNTER — Encounter (HOSPITAL_COMMUNITY): Payer: Self-pay

## 2016-06-01 DIAGNOSIS — Z5189 Encounter for other specified aftercare: Secondary | ICD-10-CM | POA: Diagnosis not present

## 2016-06-01 DIAGNOSIS — O9089 Other complications of the puerperium, not elsewhere classified: Secondary | ICD-10-CM | POA: Insufficient documentation

## 2016-06-01 NOTE — Discharge Instructions (Signed)
Continue cleasing area with diluted hydrogen peroxide.  Dry area well with hair dryer on cool setting.  Place Telfa pad over incision.

## 2016-06-01 NOTE — MAU Provider Note (Signed)
History     CSN: [id number]  Arrival date and time: 06/01/16 1458   First Provider Initiated Contact with Patient 06/01/16 1548      Chief Complaint  Patient presents with  . Wound Check   HPI Deborah Steele 21 y.o. Postpartum with C/S on 05-04-16.  Was seen for evaluation of C/S incision on 05-23-16 and given antibiotics.  Has finished the antibiotics but today she was stretching and thinks her incision has opened up.  Came to be evaluated.   OB History    Gravida Para Term Preterm AB Living   1 1 1  0 0 1   SAB TAB Ectopic Multiple Live Births   0 0 0 0 1      Past Medical History:  Diagnosis Date  . Headache   . Influenza 02/2016  . Medical history non-contributory     Past Surgical History:  Procedure Laterality Date  . CESAREAN SECTION N/A 05/04/2016   Procedure: CESAREAN SECTION;  Surgeon: Lesly Dukes, MD;  Location: [facility] SU[facility];  Service: Obstetrics;  Laterality: N/A;  . NO PAST SURGERIES      Family History  Problem Relation Age of Onset  . Hyperlipidemia Mother   . Hypertension Mother     Social History  Substance Use Topics  . Smoking status: Never Smoker  . Smokeless tobacco: Never Used  . Alcohol use No    Allergies: No Known Allergies  Prescriptions Prior to Admission  Medication Sig Dispense Refill Last Dose  . acetaminophen (TYLENOL) 500 MG tablet Take 500 mg by mouth every 6 (six) hours as needed for headache.   Taking  . amLODipine (NORVASC) 10 MG tablet Take 1 tablet (10 mg total) by mouth daily. 30 tablet 5 Taking  . cephALEXin (KEFLEX) 500 MG capsule Take 1 capsule (500 mg total) by mouth 3 (three) times daily. 21 capsule 0   . ibuprofen (ADVIL,MOTRIN) 600 MG tablet Take 1 tablet (600 mg total) by mouth every 6 (six) hours as needed for mild pain. 120 tablet 2 Taking  . oxyCODONE 10 MG TABS Take 1 tablet (10 mg total) by mouth every 4 (four) hours as needed (pain score >/=7). 45 tablet 0 Taking  . Prenatal Vit-Fe Fumarate-FA  (PRENATAL MULTIVITAMIN) TABS tablet Take 1 tablet by mouth daily at 12 noon.   Taking  . senna-docusate (SENOKOT-S) 8.6-50 MG tablet Take 2 tablets by mouth 2 (two) times daily. 200 tablet 2 Taking    Review of Systems  Constitutional: Negative for fever.  Gastrointestinal: Negative for nausea and vomiting.       Has some pain in her abdomen under one side of her incision   Physical Exam   Blood pressure 128/69, pulse 80, temperature 98.7 F (37.1 C), temperature source Oral, resp. rate 17, height 5\' 2"  (1.575 m), weight 187 lb (84.8 kg), SpO2 98 %, unknown if currently breastfeeding.  Physical Exam  Nursing note and vitals reviewed. Constitutional: She is oriented to person, place, and time. She appears well-developed and well-nourished.  HENT:  Head: Normocephalic.  Eyes: EOM are normal.  Neck: Neck supple.  GI: Soft. There is no tenderness. There is no rebound and no guarding.  Incision is closed and is located in the fold under a small panus.  Some stitches can be seen when the panus is gently retracted.  Client has one are that was superficially open on 05-23-16.  Can still see a small open area in the outer layer of skin - 3mm  in size.  No other open area is seen.  Noted pinker areas of skin especially where the white sutures can be seen under the skin.  No swelling and no odor.  Appearance is consistent with a healing incision.  Musculoskeletal: Normal range of motion.  Neurological: She is alert and oriented to person, place, and time.  Skin: Skin is warm and dry.  Psychiatric: She has a normal mood and affect.    MAU Course  Procedures  MDM Client has an appointment on 06-16-16 in the clinic.  Today she stretched her arms above her head and felt something pop and she was very worried that her incision has opened up.  She as been putting hydrogen peroxide on as instructed daily.  Advised her to dry the area very well  - gave her some teflon pads to place on the incision as it is  very deep in a fold and seems to be damp in that fold.    Assessment and Plan  Incision check - no further opening of the incision than she had at her last appointment.  Plan Keep your postpartum appointment as scheduled. Incision seems to be healing well at present - reassured.  Terri L Burleson 06/01/2016, 3:49 PM

## 2016-06-01 NOTE — MAU Note (Signed)
Pt states she a c/section delivery on February 11th. Pt states last week she noticed an odor from her incision site. Pt denies drainage from the site. Pt states she was given antibiotics to take and the odor has gone away. Pt states this morning when she got up she stretched and the incision opened up and started burning.

## 2016-06-06 ENCOUNTER — Ambulatory Visit: Payer: Commercial Managed Care - PPO | Admitting: Obstetrics and Gynecology

## 2016-06-06 ENCOUNTER — Encounter: Payer: Self-pay | Admitting: Obstetrics and Gynecology

## 2016-06-06 VITALS — BP 112/71 | HR 78 | Temp 98.5°F

## 2016-06-06 DIAGNOSIS — Z4889 Encounter for other specified surgical aftercare: Secondary | ICD-10-CM

## 2016-06-06 DIAGNOSIS — Z5189 Encounter for other specified aftercare: Secondary | ICD-10-CM

## 2016-06-06 NOTE — Patient Instructions (Signed)

## 2016-06-06 NOTE — Progress Notes (Signed)
Patient presents to clinic for incision check. C/s 2/11, saw Dr Alysia PennaErvin 3/2 for wound drainage was placed on keflex. Pt took full course keflex but was seen in [facility] 3/11 for incision check, now in clinic today. Patient states the wound is still draining. Upon exam of low transverse incision noted three separate openings in incision with visible sutures. Approx 3mm, 3mm, 1cm with purulent drainage noted. Did not detect odor. Incision is reddened but no edema noted. Patient is afebrile. Will consult with Dr Alysia PennaErvin.   As noted above. Pt denies any fever or chills.  PE AF VSS Abd soft + BS small panus noted Incision well healed for the most part, 2 small Q tip size openings noted, no purulent discharge expressed, edges healthy tissue, suture noted at areas of separation, 3 cm area of skin edge separation noted, again no purulent discharge expressed, healthy tissue edges noted  A/P Wound Check  Pt reassured no frank evidence of infection. Suspect discharge and odor pt notices is related to her panus. Pt instructed to clean wound area with H2O2 three times a day. Cover with gauze as needed. Pt informed that wound will heal by secondary intention and probably will take 10-14 days. She will follow up with her PP visit week of March 26.

## 2016-06-16 ENCOUNTER — Ambulatory Visit (INDEPENDENT_AMBULATORY_CARE_PROVIDER_SITE_OTHER): Payer: Commercial Managed Care - PPO | Admitting: Family Medicine

## 2016-06-16 ENCOUNTER — Ambulatory Visit: Payer: Medicaid Other | Admitting: Medical

## 2016-06-16 VITALS — BP 119/88 | HR 85 | Wt 192.4 lb

## 2016-06-16 DIAGNOSIS — Z3202 Encounter for pregnancy test, result negative: Secondary | ICD-10-CM

## 2016-06-16 DIAGNOSIS — Z30430 Encounter for insertion of intrauterine contraceptive device: Secondary | ICD-10-CM | POA: Diagnosis not present

## 2016-06-16 DIAGNOSIS — N898 Other specified noninflammatory disorders of vagina: Secondary | ICD-10-CM

## 2016-06-16 LAB — POCT PREGNANCY, URINE: PREG TEST UR: NEGATIVE

## 2016-06-16 MED ORDER — LEVONORGESTREL 20 MCG/24HR IU IUD
INTRAUTERINE_SYSTEM | Freq: Once | INTRAUTERINE | Status: AC
  Administered 2016-06-16: 1 via INTRAUTERINE

## 2016-06-16 NOTE — Progress Notes (Signed)
Subjective:     Deborah Steele is a 21 y.o. female who presents for a postpartum visit. She is 6 weeks postpartum following a low cervical transverse Cesarean section. I have fully reviewed the prenatal and intrapartum course. The delivery was at 39 gestational weeks. Outcome: primary cesarean section, low transverse incision. Anesthesia: spinal. Postpartum course has been remarkable for wound healing issues. Pt now complaining of a "bump" in her vaginal area. Located on labia, left side, comes/goes, size of a pea or smaller, does not drain. Incisional wound care tid. Baby's course has been unremarkable. Baby is feeding by bottle - Similac Advance. Bleeding no bleeding, no menses yet. Bowel function is normal. Bladder function is normal. Patient is not sexually active, attempted intercourse with condom, but did not do. Contraception method is none. Postpartum depression screening: negative.  The following portions of the patient's history were reviewed and updated as appropriate: allergies, current medications, past family history, past medical history, past social history, past surgical history and problem list.  Review of Systems Pertinent items are noted in HPI.   Objective:    There were no vitals taken for this visit.  General:  alert, cooperative, appears stated age and no distress   Breasts:  inspection negative, no nipple discharge or bleeding, no masses or nodularity palpable  Lungs: clear to auscultation bilaterally  Heart:  regular rate and rhythm, S1, S2 normal, no murmur, click, rub or gallop  Abdomen: soft, non-tender; bowel sounds normal; no masses,  no organomegaly   Vulva:  positive for left labial single papule, likely ingrown hair, cultured with HSV swab; otherwise normal appearance  Vagina: normal vagina, no discharge, exudate, lesion, or erythema  Cervix:  multiparous appearance, no cervical motion tenderness and no lesions  Corpus: normal  Adnexa:  normal adnexa  Rectal Exam:  Not performed.        Assessment:     6 week postpartum exam. Pap smear done 11/07/15, normal, neg HPV.  Plan:    1. Contraception: IUD 2.  Next pap 2022.  3. Follow up in: 4 weeks for IUD string check or as needed.

## 2016-06-16 NOTE — Patient Instructions (Signed)

## 2016-06-17 ENCOUNTER — Encounter: Payer: Self-pay | Admitting: Family Medicine

## 2016-06-17 NOTE — Progress Notes (Signed)
Pennie Rushingna Valera is a 21 y.o. year old Hispanic female G1P1 recently delivered 6 weeks ago, who presents for placement of a Mirena IUD.  No LMP recorded. Delivery 6 weeks ago. Last sexual intercourse was recently, but with condom and only once, and pregnancy test today was negative.  The risks and benefits of the method and placement have been thouroughly reviewed with the patient and all questions were answered.  Specifically the patient is aware of failure rate of 03/998, expulsion of the IUD and of possible perforation.  The patient is aware of irregular bleeding due to the method and understands the incidence of irregular bleeding diminishes with time.  Signed copy of informed consent in chart.   Time out was performed.  A Graves speculum was placed in the vagina.  The cervix was visualized, prepped using Betadin, and grasped with a single tooth tenaculum. The uterus was found to be anteroflexed and it sounded to 7 cm.  Mirena IUD placed per manufacturer's recommendations.   The strings were trimmed to 3 cm.  Sonogram was performed and the proper placement of the IUD was verified via transvaginal u/s.   The patient was given post procedure instructions, including signs and symptoms of infection and to check for the strings after each menses or each month, and refraining from intercourse or anything in the vagina for 3 days.  She was given a Mirena care card with date Mirena placed, and date Mirena to be removed.  She is scheduled for a return appointment after her first menses or 4 weeks.  Jen MowElizabeth Dejay Kronk, DO [facility] 06/16/16

## 2016-06-19 LAB — HERPES SIMPLEX VIRUS CULTURE

## 2016-06-30 ENCOUNTER — Encounter (HOSPITAL_COMMUNITY): Payer: Self-pay | Admitting: Obstetrics & Gynecology

## 2016-07-14 ENCOUNTER — Ambulatory Visit (INDEPENDENT_AMBULATORY_CARE_PROVIDER_SITE_OTHER): Payer: Commercial Managed Care - PPO | Admitting: Family Medicine

## 2016-07-14 ENCOUNTER — Encounter: Payer: Self-pay | Admitting: Family Medicine

## 2016-07-14 VITALS — BP 133/73 | HR 80 | Wt 191.4 lb

## 2016-07-14 DIAGNOSIS — Z30431 Encounter for routine checking of intrauterine contraceptive device: Secondary | ICD-10-CM

## 2016-07-14 DIAGNOSIS — Z5189 Encounter for other specified aftercare: Secondary | ICD-10-CM

## 2016-07-14 NOTE — Patient Instructions (Signed)
Incision Care, Adult °An incision is a cut that a doctor makes in your skin for surgery (for a procedure). Most times, these cuts are closed after surgery. Your cut from surgery may be closed with stitches (sutures), staples, skin glue, or skin tape (adhesive strips). You may need to return to your doctor to have stitches or staples taken out. This may happen many days or many weeks after your surgery. The cut needs to be well cared for so it does not get infected. °How to care for your cut °Cut care °· Follow instructions from your doctor about how to take care of your cut. Make sure you: °? Wash your hands with soap and water before you change your bandage (dressing). If you cannot use soap and water, use hand sanitizer. °? Change your bandage as told by your doctor. °? Leave stitches, skin glue, or skin tape in place. They may need to stay in place for 2 weeks or longer. If tape strips get loose and curl up, you may trim the loose edges. Do not remove tape strips completely unless your doctor says it is okay. °· Check your cut area every day for signs of infection. Check for: °? More redness, swelling, or pain. °? More fluid or blood. °? Warmth. °? Pus or a bad smell. °· Ask your doctor how to clean the cut. This may include: °? Using mild soap and water. °? Using a clean towel to pat the cut dry after you clean it. °? Putting a cream or ointment on the cut. Do this only as told by your doctor. °? Covering the cut with a clean bandage. °· Ask your doctor when you can leave the cut uncovered. °· Do not take baths, swim, or use a hot tub until your doctor says it is okay. Ask your doctor if you can take showers. You may only be allowed to take sponge baths for bathing. °Medicines °· If you were prescribed an antibiotic medicine, cream, or ointment, take the antibiotic or put it on the cut as told by your doctor. Do not stop taking or putting on the antibiotic even if your condition gets better. °· Take  over-the-counter and prescription medicines only as told by your doctor. °General instructions °· Limit movement around your cut. This helps healing. °? Avoid straining, lifting, or exercise for the first month, or for as long as told by your doctor. °? Follow instructions from your doctor about going back to your normal activities. °? Ask your doctor what activities are safe. °· Protect your cut from the sun when you are outside for the first 6 months, or for as long as told by your doctor. Put on sunscreen around the scar or cover up the scar. °· Keep all follow-up visits as told by your doctor. This is important. °Contact a doctor if: °· Your have more redness, swelling, or pain around the cut. °· You have more fluid or blood coming from the cut. °· Your cut feels warm to the touch. °· You have pus or a bad smell coming from the cut. °· You have a fever or shaking chills. °· You feel sick to your stomach (nauseous) or you throw up (vomit). °· You are dizzy. °· Your stitches or staples come undone. °Get help right away if: °· You have a red streak coming from your cut. °· Your cut bleeds through the bandage and the bleeding does not stop with gentle pressure. °· The edges of your cut open   up and separate. °· You have very bad (severe) pain. °· You have a rash. °· You are confused. °· You pass out (faint). °· You have trouble breathing and you have a fast heartbeat. °This information is not intended to replace advice given to you by your health care provider. Make sure you discuss any questions you have with your health care provider. °Document Released: 06/02/2011 Document Revised: 11/16/2015 Document Reviewed: 11/16/2015 °Elsevier Interactive Patient Education © 2017 Elsevier Inc. ° °

## 2016-07-14 NOTE — Progress Notes (Signed)
   CLINIC ENCOUNTER NOTE  History:  21 y.o. G1P1001 here today for cesarean incision check and IUD string check.  Still cleaning incision area with hydrogen peroxide and covering with gauze. Minimal drainage. No odor. No skin redness. No fevers/chills.   States two days after Mirena was inserted, husband got "poked" by strings. Has not had sex again since. Otherwise no complaints of Mirena.   Past Medical History:  Diagnosis Date  . Headache   . Influenza 02/2016  . Medical history non-contributory     Past Surgical History:  Procedure Laterality Date  . CESAREAN SECTION N/A 05/04/2016   Procedure: CESAREAN SECTION;  Surgeon: Lesly Dukes, MD;  Location: [facility] BIRTHING SUITES;  Service: Obstetrics;  Laterality: N/A;  . NO PAST SURGERIES      The following portions of the patient's history were reviewed and updated as appropriate: allergies, current medications, past family history, past medical history, past social history, past surgical history and problem list.     Review of Systems:  See above; comprehensive review of systems was otherwise negative.   Objective:  Physical Exam BP 133/73   Pulse 80   Wt 191 lb 6.4 oz (86.8 kg)   BMI 35.01 kg/m  CONSTITUTIONAL: Well-developed, well-nourished obese female in no acute distress.  HENT:  Normocephalic, atraumatic SKIN: Skin is warm and dry.  NEUROLGIC: Alert  PSYCHIATRIC: Normal mood and affect.  CARDIOVASCULAR: Normal heart rate noted RESPIRATORY: Effort and breath sounds normal, no problems with respiration noted ABDOMEN: Soft, no distention noted.  No tenderness, rebound or guarding.  INCISION: Marked improvement of incision site with delayed wound healing. No sign of infection. Small <1cm opening just left (patient's left) of midline. Granulation tissue present. No drainage. Fully healed area from previous site. PELVIC: Normal appearing external genitalia; normal appearing vaginal mucosa and cervix. TWO STRINGS  VISUALIZED FROM OS. Strings are soft/pliable, wrapped around cervix, no need to cut strings. Normal appearing discharge.  Normal uterine size, no other palpable masses, no uterine or adnexal tenderness.     Assessment & Plan:   1. Encounter for routine checking of intrauterine contraceptive device (IUD) - Two strings visualized  2. Encounter for wound re-check - Marked improvement of incision site with delayed wound healing. No sign of infection. Small <1cm opening just left (patient's left) of midline. Granulation tissue present. No drainage.   Routine preventative health maintenance measures emphasized.     Jen Mow, DO OB/GYN Fellow [facility] for [employer], [facility]

## 2016-08-11 ENCOUNTER — Ambulatory Visit: Payer: Commercial Managed Care - PPO

## 2016-08-12 ENCOUNTER — Ambulatory Visit: Payer: Commercial Managed Care - PPO | Admitting: *Deleted

## 2016-08-12 VITALS — BP 127/89 | HR 90 | Wt 186.4 lb

## 2016-08-12 DIAGNOSIS — Z5189 Encounter for other specified aftercare: Secondary | ICD-10-CM

## 2016-08-12 NOTE — Progress Notes (Signed)
In for wound check. Wound clean , dry, intact , pink. No discharge noted. Patient c/o feels pinches sometimes, we discussed this is normal.  Dr.Schenk also in to check wound. Instructed patient may slowly resume normal activity.  Call us if any further issues.

## 2016-08-29 NOTE — Anesthesia Postprocedure Evaluation (Signed)
Anesthesia Post Note  Patient: Deborah RushingAna Steele  Procedure(s) Performed: Procedure(s) (LRB): CESAREAN SECTION (N/A)     Anesthesia Post Evaluation  Last Vitals:  Vitals:   05/07/16 0932 05/07/16 1127  BP: 122/66 (!) 141/83  Pulse: 89 100  Resp:  18  Temp:  37.3 C    Last Pain:  Vitals:   05/07/16 1127  TempSrc: Oral  PainSc: 3    Pain Goal: Patients Stated Pain Goal: 3 (05/07/16 1127)               Gilberto Stanforth JR,JOHN Susann GivensFRANKLIN

## 2016-08-29 NOTE — Addendum Note (Signed)
Addendum  created 08/29/16 0957 by Marlisha Vanwyk, MD   Sign clinical note    

## 2017-07-07 ENCOUNTER — Encounter: Payer: Self-pay | Admitting: *Deleted

## 2017-09-22 ENCOUNTER — Ambulatory Visit (INDEPENDENT_AMBULATORY_CARE_PROVIDER_SITE_OTHER): Payer: Commercial Managed Care - PPO | Admitting: Obstetrics & Gynecology

## 2017-09-22 ENCOUNTER — Ambulatory Visit (INDEPENDENT_AMBULATORY_CARE_PROVIDER_SITE_OTHER): Payer: Self-pay | Admitting: Clinical

## 2017-09-22 ENCOUNTER — Encounter: Payer: Self-pay | Admitting: Obstetrics & Gynecology

## 2017-09-22 VITALS — BP 129/81 | HR 75 | Ht 62.0 in | Wt 163.7 lb

## 2017-09-22 DIAGNOSIS — Z124 Encounter for screening for malignant neoplasm of cervix: Secondary | ICD-10-CM | POA: Diagnosis not present

## 2017-09-22 DIAGNOSIS — Z01419 Encounter for gynecological examination (general) (routine) without abnormal findings: Secondary | ICD-10-CM | POA: Diagnosis not present

## 2017-09-22 DIAGNOSIS — Z113 Encounter for screening for infections with a predominantly sexual mode of transmission: Secondary | ICD-10-CM | POA: Diagnosis not present

## 2017-09-22 DIAGNOSIS — F439 Reaction to severe stress, unspecified: Secondary | ICD-10-CM

## 2017-09-22 DIAGNOSIS — F4323 Adjustment disorder with mixed anxiety and depressed mood: Secondary | ICD-10-CM

## 2017-09-22 NOTE — BH Specialist Note (Signed)
Integrated Behavioral Health Initial Visit  MRN: [id number] Name: Deborah Steele  Number of Integrated Behavioral Health Clinician visits:: 1/6 Session Start time: 11:30  Session End time: 11:53 Total time: 20 minutes  Type of Service: Integrated Behavioral Health- Individual/Family Interpretor:No. Interpretor Name and Language: n/a   Warm Hand Off Completed.       SUBJECTIVE: Deborah Steele is a 22 y.o. female accompanied by n/a Patient was referred by Dr Marice Potter for pt concern about depression Patient reports the following symptoms/concerns: Pt states her primary concern today is feeling more tired and irritable than prior to the birth of her child. Pt says it's been helping to go out dancing monthly and to the gym 3-4 nights per week with her husband, but worries that she sometimes doesn't feel like she has as much energy as her pre-child days, worries about the safety of taking her child out, feels overwhelmed keeping up the responsibilities of running a household, and wants reassurance that her feelings are normal.   Duration of problem: Noticing increase in symptoms over 6 months; Severity of problem: mild  OBJECTIVE: Mood: Normal and Affect: Appropriate Risk of harm to self or others: No plan to harm self or others  LIFE CONTEXT: Family and Social: Pt lives with her husband and 23mo; family and friends supportive. Pt's mother keeps her child while she is at work, or going out.  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 Reduce symptoms of: anxiety, depression and stress 2. Increase knowledge and/or ability of: coping skills  3. 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   Patient may benefit from psychoeducation and brief therapeutic interventions regarding coping with symptoms of anxiety and depression .  PLAN: 1. Follow up with behavioral health clinician on : As needed 2. Behavioral recommendations:  -Continue going to the gym 3-4 times/week -Continue monthly night out with husband dancing; consider increasing to twice/monthly -Obtain a notebook today; begin keeping a to-do list to review daily, to prioritize daily tasks -Read educational material regarding coping with symptoms of anxiety and depression 3. Referral(s): Integrated Behavioral Health Services (In Clinic) 4. "From scale of 1-10, how likely are you to follow plan?": 9  Rae Lips, LCSW  Depression screen [facility] 2/9 09/22/2017 06/16/2016 04/25/2016 04/17/2016 04/08/2016  Decreased Interest 1 0 0 0 1  Down, Depressed, Hopeless 1 0 0 0 -  PHQ - 2 Score 2 0 0 0 1  Altered sleeping 0 0 0 0 -  Tired, decreased energy 1 0 1 1 1   Change in appetite 0 0 0 0 -  Feeling bad or failure about yourself  0 0 0 0 -  Trouble concentrating 0 0 0 0 -  Moving slowly or fidgety/restless 0 0 0 0 -  Suicidal thoughts 0 0 0 0 -  PHQ-9 Score 3 0 1 1 -   GAD 7 : Generalized Anxiety Score 09/22/2017 06/16/2016 04/25/2016 04/17/2016  Nervous, Anxious, on Edge 0 0 0 0  Control/stop worrying 0 0 0 0  Worry too much - different things 0 0 0 0  Trouble relaxing 0 0 0 0  Restless 1 0 0 0  Easily annoyed or irritable 1 1 1 1   Afraid - awful might happen 0 0 0 0  Total GAD 7 Score 2 1 1  1

## 2017-09-22 NOTE — Progress Notes (Signed)
Subjective:    Deborah Steele is a 22 y.o.married P1 31(15 months old son)  female who presents for an annual exam. The patient has no complaints today except wondering if she has "postpartum depression". She denies HI and SI. The patient is sexually active. GYN screening history: last pap: was normal. The patient wears seatbelts: yes. The patient participates in regular exercise: yes. Has the patient ever been transfused or tattooed?: no. The patient reports that there is not domestic violence in her life.   Menstrual History: OB History    Gravida  1   Para  1   Term  1   Preterm  0   AB  0   Living  1     SAB  0   TAB  0   Ectopic  0   Multiple  0   Live Births  1           Menarche age: [age] No LMP recorded. (Menstrual status: IUD).    The following portions of the patient's history were reviewed and updated as appropriate: allergies, current medications, past family history, past medical history, past social history, past surgical history and problem list.  Review of Systems Pertinent items are noted in HPI.   FH- no breast/gyn/colon cancer She had Pre e with pregnancy Works at [employer] Married for 2 years, some positional dyspareunia for 2-3 months on occasion Has IUD for contraception, Doesn't have periods, occasional old blood    Objective:    BP 129/81   Pulse 75   Ht 5\' 2"  (1.575 m)   Wt 163 lb 11.2 oz (74.3 kg)   BMI 29.94 kg/m   General Appearance:    Alert, cooperative, no distress, appears stated age  Head:    Normocephalic, without obvious abnormality, atraumatic  Eyes:    PERRL, conjunctiva/corneas clear, EOM's intact, fundi    benign, both eyes  Ears:    Normal TM's and external ear canals, both ears  Nose:   Nares normal, septum midline, mucosa normal, no drainage    or sinus tenderness  Throat:   Lips, mucosa, and tongue normal; teeth and gums normal  Neck:   Supple, symmetrical, trachea midline, no adenopathy;    thyroid:   no enlargement/tenderness/nodules; no carotid   bruit or JVD  Back:     Symmetric, no curvature, ROM normal, no CVA tenderness  Lungs:     Clear to auscultation bilaterally, respirations unlabored  Chest Wall:    No tenderness or deformity   Heart:    Regular rate and rhythm, S1 and S2 normal, no murmur, rub   or gallop  Breast Exam:    No tenderness, masses, or nipple abnormality  Abdomen:     Soft, non-tender, bowel sounds active all four quadrants,    no masses, no organomegaly  Genitalia:    Normal female without lesion, discharge or tenderness. IUD strings seen (about 2-3 cm long), normal size and shape, anteverted, mobile, non-tender, normal adnexal exam      Extremities:   Extremities normal, atraumatic, no cyanosis or edema  Pulses:   2+ and symmetric all extremities  Skin:   Skin color, texture, turgor normal, no rashes or lesions  Lymph nodes:   Cervical, supraclavicular, and axillary nodes normal  Neurologic:   CNII-XII intact, normal strength, sensation and reflexes    throughout  .    Assessment:    Healthy female exam.   Possible depression   Plan:  Thin prep Pap smear. with cotesting Appt with integrated b med Fasting labs today Thin prep pap with cervical cultures

## 2017-09-23 ENCOUNTER — Other Ambulatory Visit: Payer: Self-pay | Admitting: Obstetrics & Gynecology

## 2017-09-23 LAB — LIPID PANEL
CHOL/HDL RATIO: 2.9 ratio (ref 0.0–4.4)
Cholesterol, Total: 126 mg/dL (ref 100–199)
HDL: 44 mg/dL (ref >39)
LDL Calculated: 61 mg/dL (ref 0–99)
TRIGLYCERIDES: 107 mg/dL (ref 0–149)
VLDL Cholesterol Cal: 21 mg/dL (ref 5–40)

## 2017-09-23 LAB — COMPREHENSIVE METABOLIC PANEL
ALT: 12 IU/L (ref 0–32)
AST: 14 IU/L (ref 0–40)
Albumin/Globulin Ratio: 1.8 (ref 1.2–2.2)
Albumin: 4.6 g/dL (ref 3.5–5.5)
Alkaline Phosphatase: 60 IU/L (ref 39–117)
BUN/Creatinine Ratio: 12 (ref 9–23)
BUN: 9 mg/dL (ref 6–20)
Bilirubin Total: 0.5 mg/dL (ref 0.0–1.2)
CALCIUM: 9.4 mg/dL (ref 8.7–10.2)
CO2: 25 mmol/L (ref 20–29)
CREATININE: 0.77 mg/dL (ref 0.57–1.00)
Chloride: 102 mmol/L (ref 96–106)
GFR calc Af Amer: 125 mL/min/{1.73_m2} (ref >59)
GFR, EST NON AFRICAN AMERICAN: 108 mL/min/{1.73_m2} (ref >59)
GLOBULIN, TOTAL: 2.5 g/dL (ref 1.5–4.5)
GLUCOSE: 87 mg/dL (ref 65–99)
Potassium: 4.1 mmol/L (ref 3.5–5.2)
Sodium: 139 mmol/L (ref 134–144)
Total Protein: 7.1 g/dL (ref 6.0–8.5)

## 2017-09-23 LAB — CBC
HEMOGLOBIN: 13.1 g/dL (ref 11.1–15.9)
Hematocrit: 39 % (ref 34.0–46.6)
MCH: 29.3 pg (ref 26.6–33.0)
MCHC: 33.6 g/dL (ref 31.5–35.7)
MCV: 87 fL (ref 79–97)
Platelets: 301 10*3/uL (ref 150–450)
RBC: 4.47 x10E6/uL (ref 3.77–5.28)
RDW: 13.5 % (ref 12.3–15.4)
WBC: 6.6 10*3/uL (ref 3.4–10.8)

## 2017-09-23 LAB — CYTOLOGY - PAP
Chlamydia: NEGATIVE
Diagnosis: NEGATIVE
Neisseria Gonorrhea: NEGATIVE

## 2017-09-23 LAB — VITAMIN D 25 HYDROXY (VIT D DEFICIENCY, FRACTURES): Vit D, 25-Hydroxy: 28.1 ng/mL — ABNORMAL LOW (ref 30.0–100.0)

## 2017-09-23 LAB — TSH: TSH: 0.871 u[IU]/mL (ref 0.450–4.500)

## 2017-09-23 MED ORDER — VITAMIN D (ERGOCALCIFEROL) 1.25 MG (50000 UNIT) PO CAPS: 50000.0000 [IU] | ORAL_CAPSULE | ORAL | 1 refills | Status: DC

## 2017-09-23 NOTE — Progress Notes (Signed)
Vitamin D called in 

## 2018-04-04 IMAGING — US US MFM OB COMP +14 WKS
1 series · 14 of 28 positions shown · non-contrast
Comparison: none

[Series 1: us mfm ob comp +14 wks · 68 acquisitions, 14 frames shown]
[im 3/68]
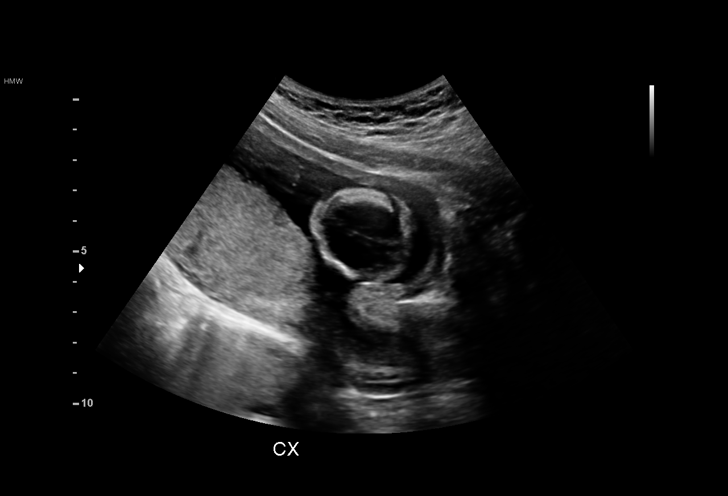
[im 8/68]
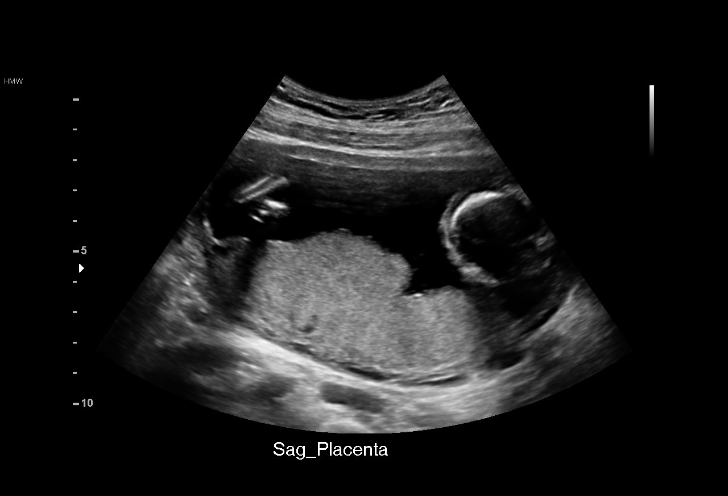
[im 13/68]
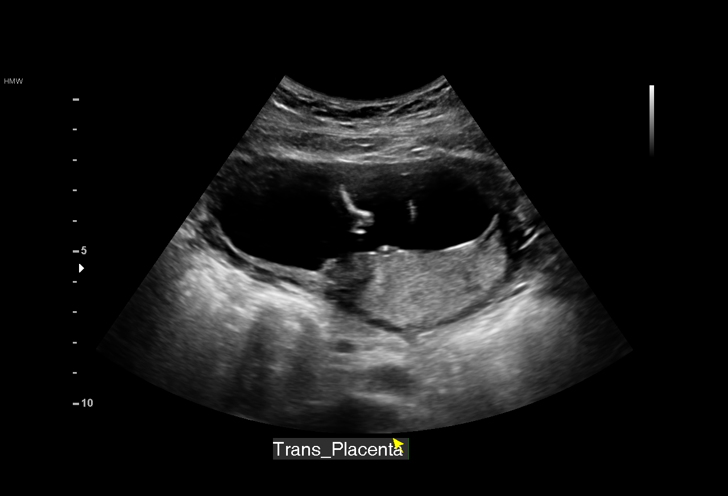
[im 18/68]
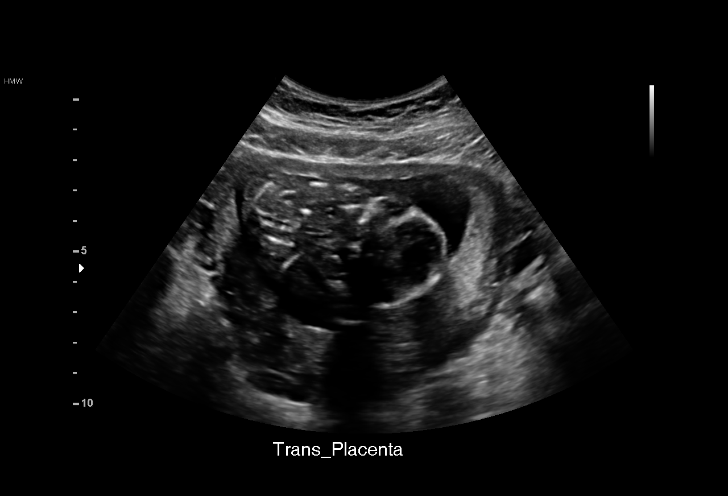
[im 23/68]
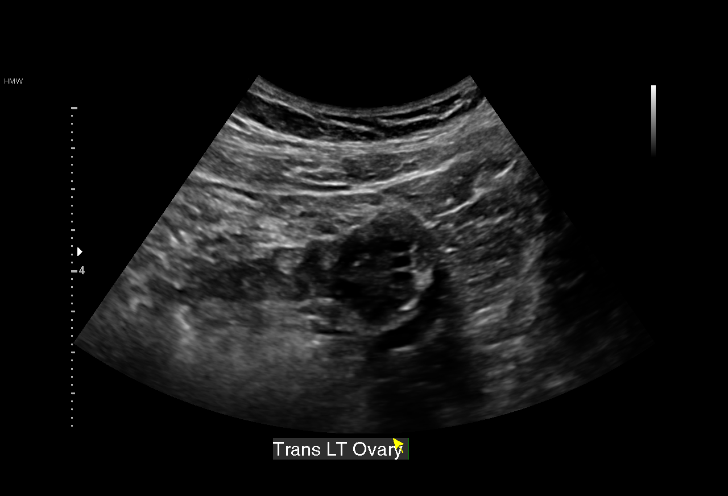
[im 28/68]
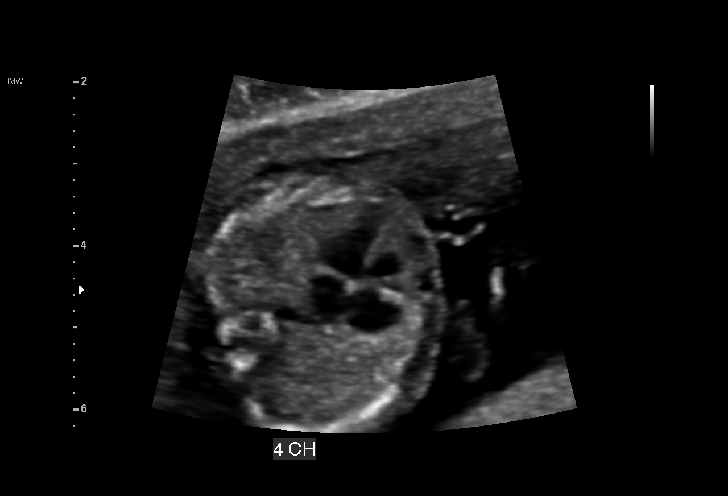
[im 33/68]
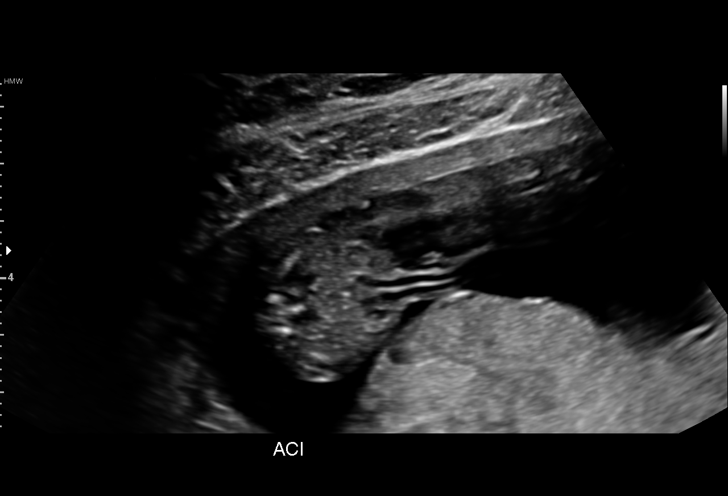
[im 38/68]
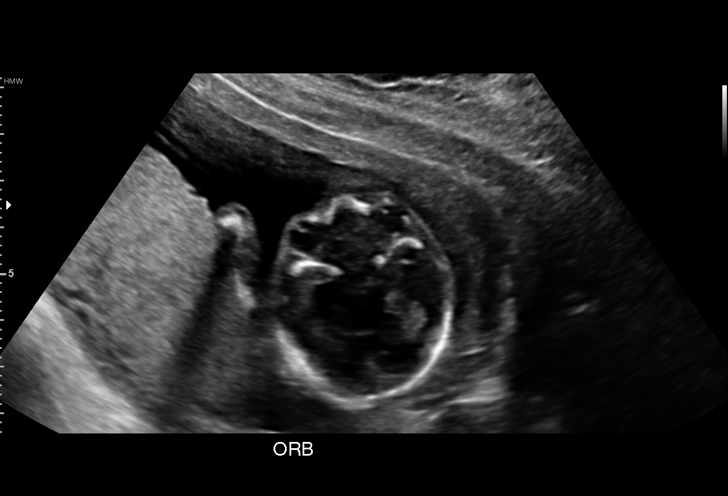
[im 43/68]
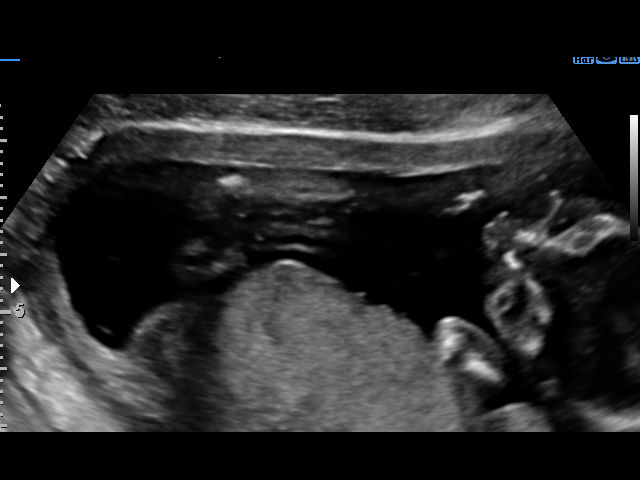
[im 48/68]
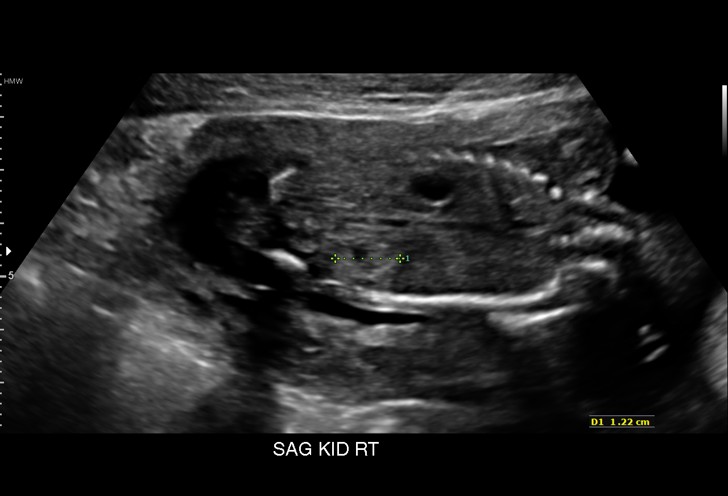
[im 53/68]
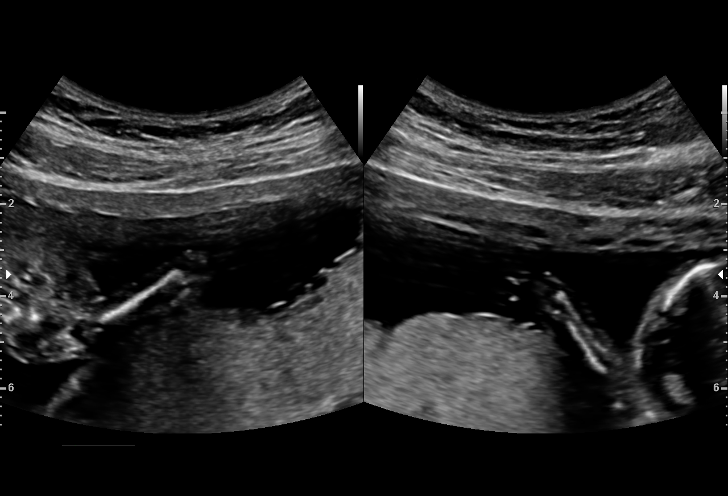
[im 58/68]
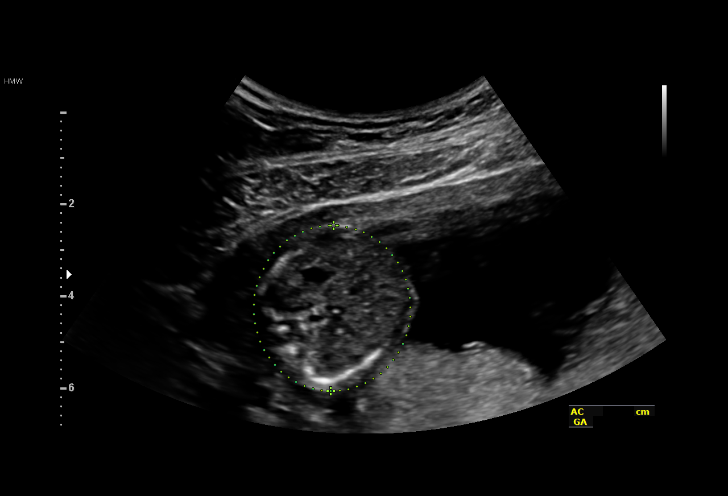
[im 63/68]
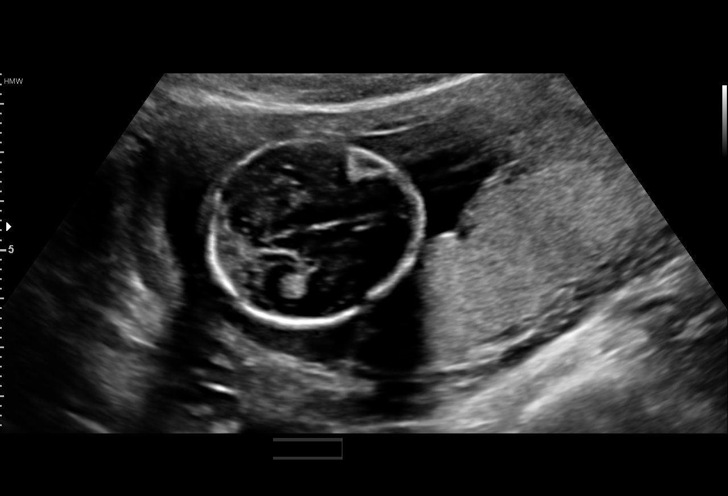
[im 68/68]
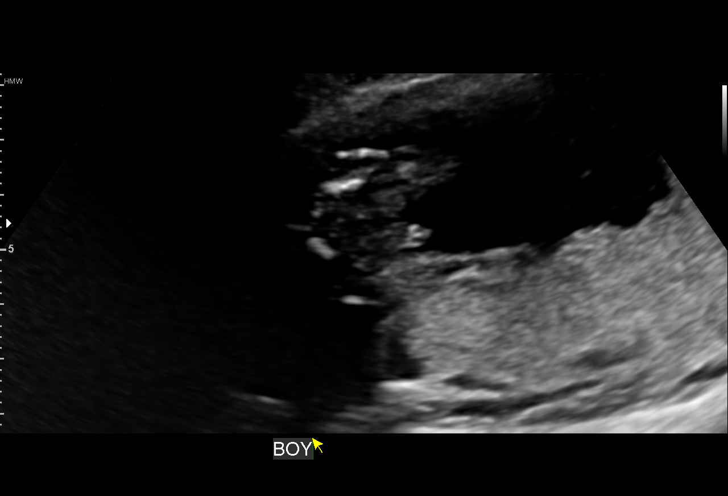

[14 of 28 positions shown; findings below may reference images not displayed]

Indications

16 weeks gestation of pregnancy
Basic anatomic survey                          Z36
Uncertain LMP,  Establish Gestational Age      Z36
OB History

Blood Type:            Height:  5'2"   Weight (lb):  175       BMI:  32
Gravidity:    1         Term:   0        Prem:   0        SAB:   0
TOP:          0       Ectopic:  0        Living: 0
Fetal Evaluation

Num Of Fetuses:     1
Fetal Heart         153
Rate(bpm):
Cardiac Activity:   Observed
Presentation:       Cephalic
Placenta:           Posterior
P. Cord Insertion:  Visualized, central

Amniotic Fluid
AFI FV:      Subjectively within normal limits

Largest Pocket(cm)
3.7
Biometry

BPD:      37.9  mm     G. Age:  17w 4d         80  %    CI:        79.77   %    70 - 86
FL/HC:      15.4   %    14.6 -
HC:      134.1  mm     G. Age:  16w 6d         43  %    HC/AC:      1.24        1.07 -
AC:      108.3  mm     G. Age:  16w 5d         45  %    FL/BPD:     54.4   %
FL:       20.6  mm     G. Age:  16w 1d         20  %    FL/AC:      19.0   %    20 - 24
HUM:      21.4  mm     G. Age:  16w 3d         46  %

Est. FW:     159  gm      0 lb 6 oz     50  %
Gestational Age

LMP:           19w 1d        Date:  07/17/15                 EDD:   04/22/16
U/S Today:     16w 6d                                        EDD:   05/08/16
Best:          16w 6d     Det. By:  U/S (11/28/15)           EDD:   05/08/16
Anatomy

Cranium:               Appears normal         Aortic Arch:            Not well visualized
Cavum:                 Not well visualized    Ductal Arch:            Appears normal
Ventricles:            Not well visualized    Diaphragm:              Appears normal
Choroid Plexus:        Appears normal         Stomach:                Appears normal, left
sided
Cerebellum:            Appears normal         Abdomen:                Appears normal
Posterior Fossa:       Appears normal         Abdominal Wall:         Appears nml (cord
insert, abd wall)
Nuchal Fold:           Not well visualized    Cord Vessels:           Appears normal (3
vessel cord)
Face:                  Appears normal         Kidneys:                Appear normal
(orbits and profile)
Lips:                  Not well visualized    Bladder:                Appears normal
Thoracic:              Appears normal         Spine:                  Not well visualized
Heart:                 Appears normal         Upper Extremities:      Appears normal
(4CH, axis, and situs
RVOT:                  Not well visualized    Lower Extremities:      Appears normal
LVOT:                  Not well visualized

Other:  Technically difficult due to early gestational age. Technically difficult
due to maternal habitus.
Cervix Uterus Adnexa

Cervix
Length:            3.1  cm.
Not visualized (advanced GA >46wks)

Uterus
No abnormality visualized.

Left Ovary
No adnexal mass visualized. Within normal limits.

Right Ovary
No adnexal mass visualized.

Cul De Sac:   No free fluid seen.

Adnexa:       No abnormality visualized.
Impression

SIUP at 15w5d
active singleton fetus
EFW 50th%'le, noting dating by today's ultrasound generates
EDC 05/08/16
no dysmorphic features
limitations as above
no previa
Recommendations

Follow up attempt to complete survey in 6 weeks.

## 2018-04-24 IMAGING — US US MFM OB FOLLOW-UP
1 series · 14 of 28 positions shown · non-contrast
Comparison: none

[Series 1: us mfm ob follow-up · 108 acquisitions, 14 frames shown]
[im 4/108]
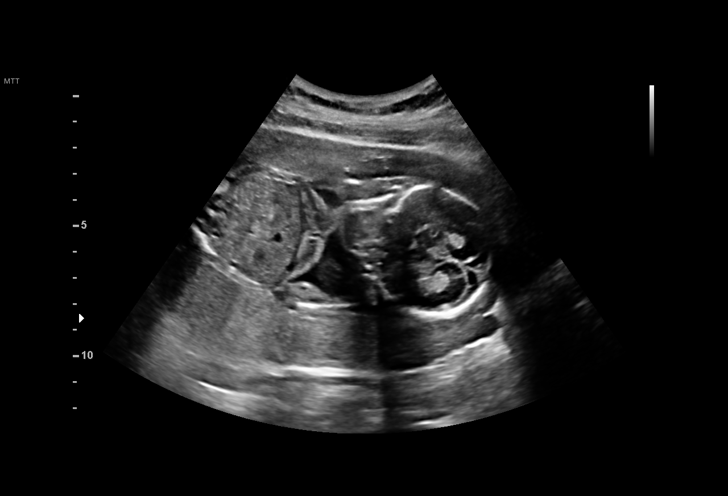
[im 12/108]
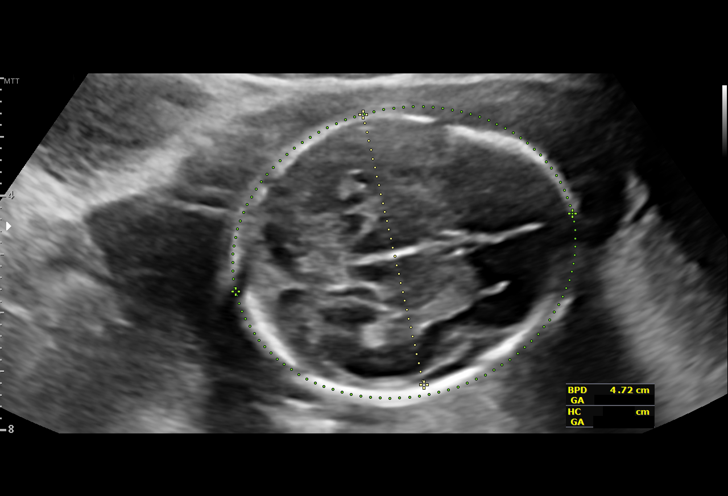
[im 20/108]
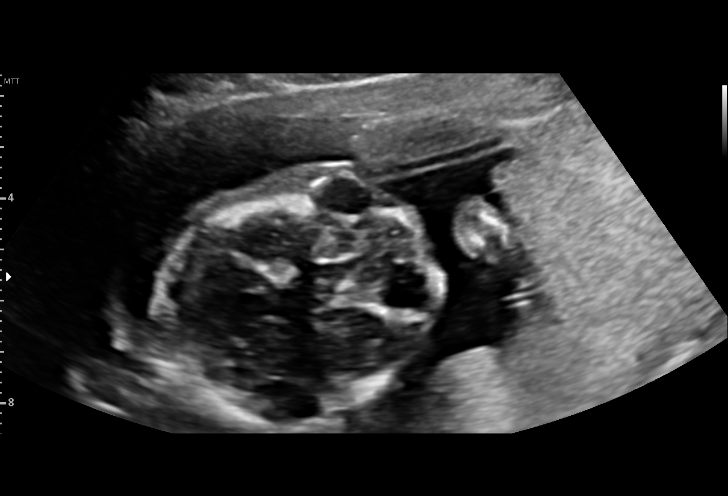
[im 28/108]
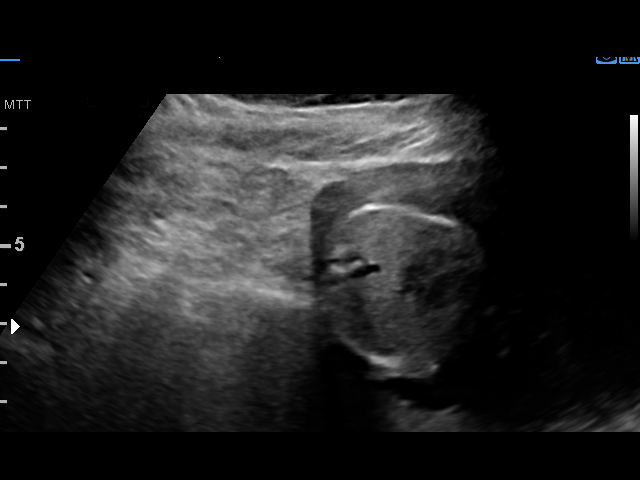
[im 36/108]
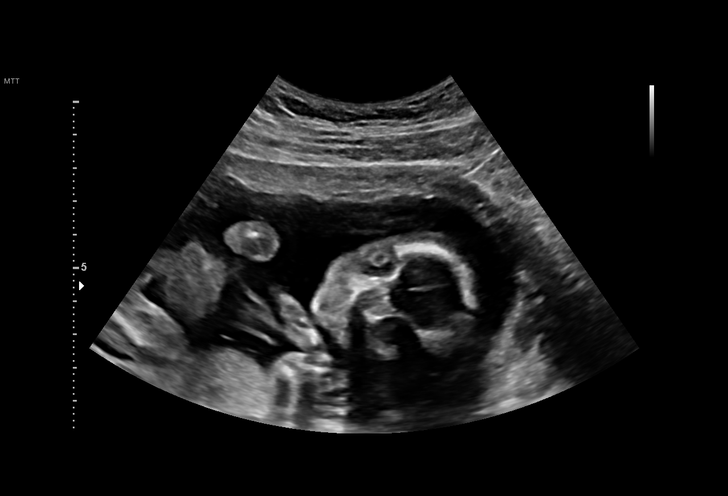
[im 44/108]
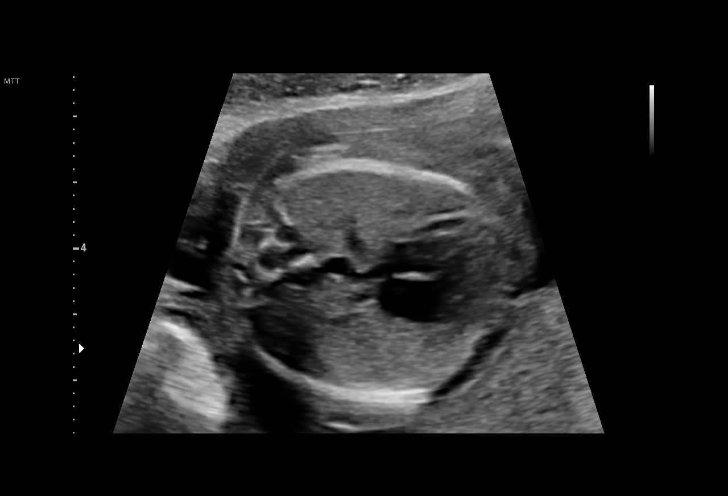
[im 52/108]
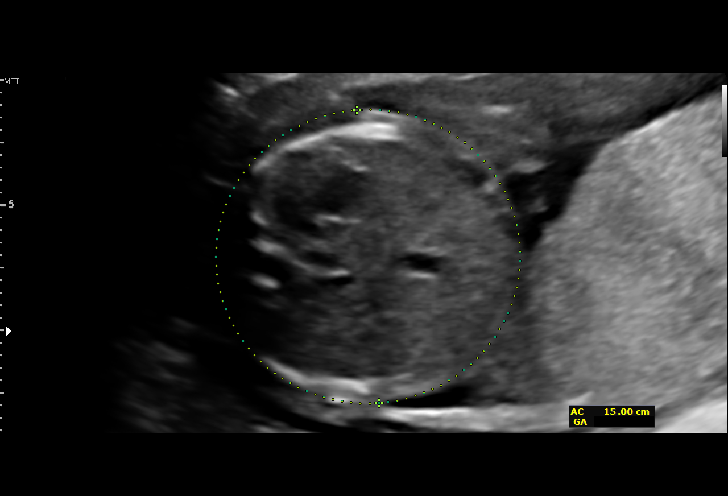
[im 60/108]
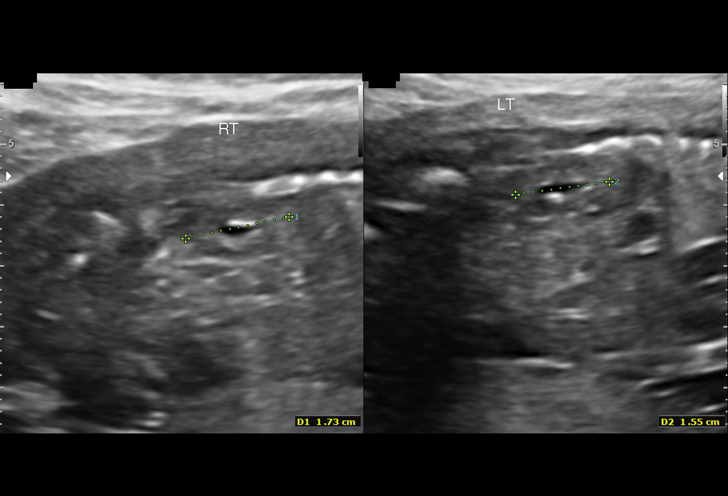
[im 68/108]
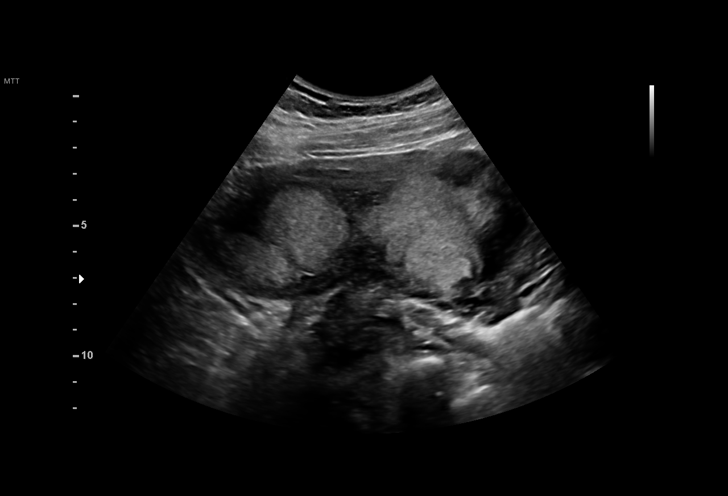
[im 76/108]
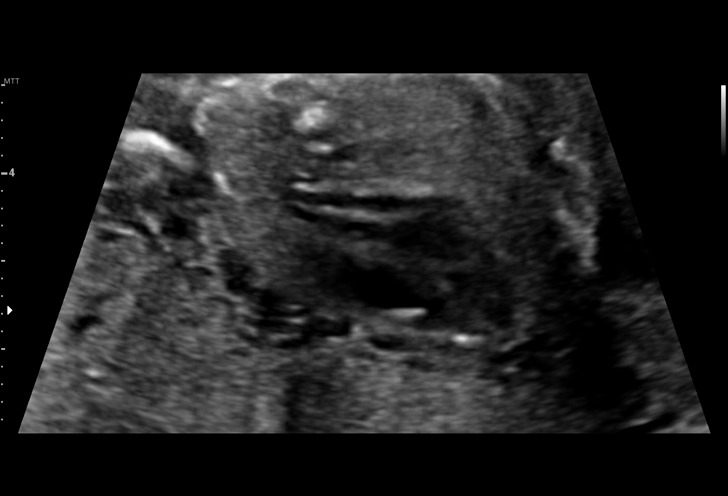
[im 84/108]
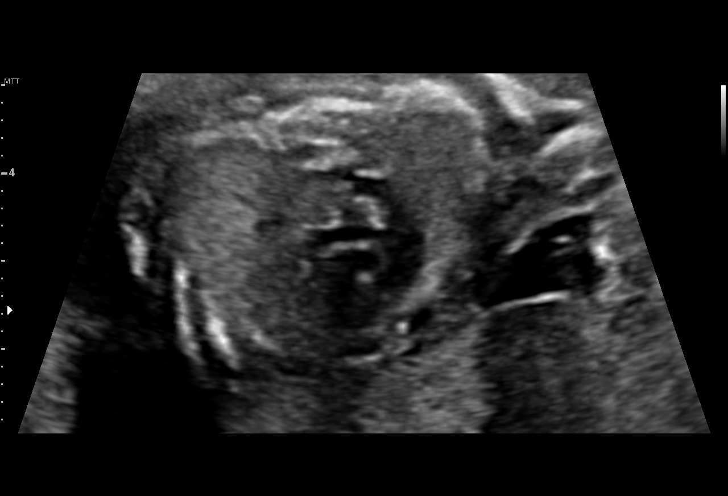
[im 92/108]
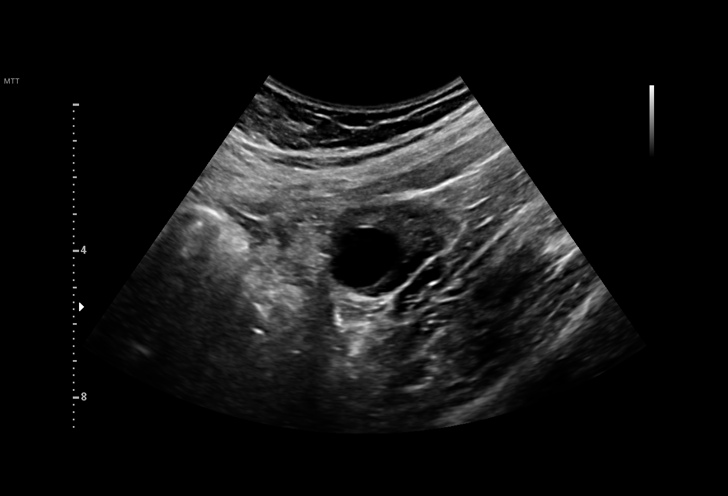
[im 100/108]
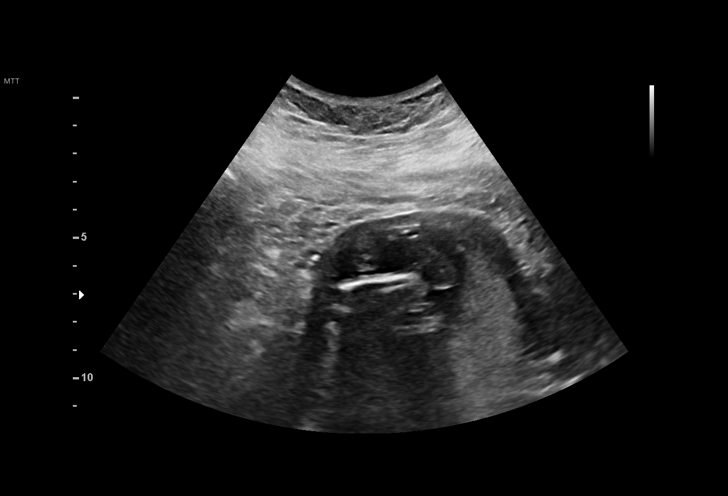
[im 108/108]
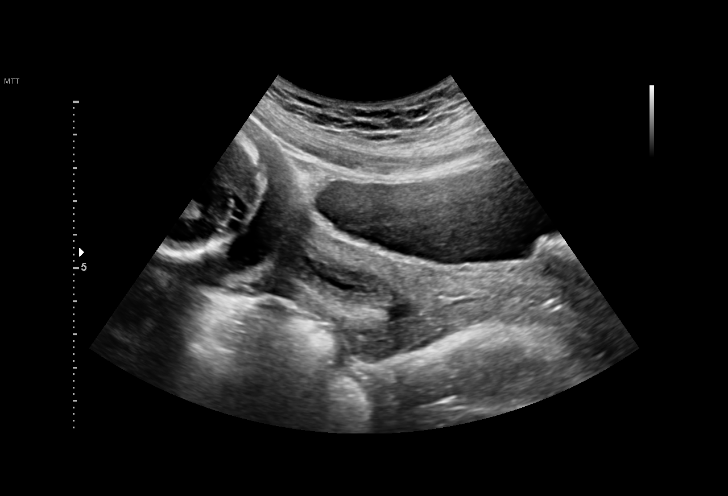

[14 of 28 positions shown; findings below may reference images not displayed]

1  RICHAR KOSKI           [id number]      [id number]     [id number]
Indications

19 weeks gestation of pregnancy
Evaluate anatomy not seen on prior             Z36
sonogram
OB History

Blood Type:            Height:  5'2"   Weight (lb):  175       BMI:  32
Gravidity:    1         Term:   0        Prem:   0         SAB:   0
TOP:          0       Ectopic:  0        Living: 0
Fetal Evaluation

Num Of Fetuses:     1
Fetal Heart         143
Rate(bpm):
Cardiac Activity:   Observed
Presentation:       Cephalic
Placenta:           Posterior, above cervical os
P. Cord Insertion:  Previously Visualized

Amniotic Fluid
AFI FV:      Subjectively within normal limits

Largest Pocket(cm)
3.9
Biometry

BPD:      47.5  mm     G. Age:  20w 2d         76  %    CI:         80.77  %    70 - 86
FL/HC:       18.1  %    16.8 -
HC:      166.9  mm     G. Age:  19w 3d         27  %    HC/AC:       1.10       1.09 -
AC:      151.9  mm     G. Age:  20w 3d         67  %    FL/BPD:      63.6  %
FL:       30.2  mm     G. Age:  19w 3d         29  %    FL/AC:       19.9  %    20 - 24
HUM:      28.5  mm     G. Age:  19w 1d         40  %
CER:      19.4  mm     G. Age:  18w 5d         24  %
NFT:       2.7  mm

CM:        4.5  mm

Est. FW:     318   gm    0 lb 11 oz     51  %
Gestational Age

LMP:           22w 0d        Date:  07/17/15                 EDD:    04/22/16
U/S Today:     19w 6d                                        EDD:    05/07/16
Best:          19w 5d     Det. By:  U/S  (11/28/15)          EDD:    05/08/16
Anatomy

Cranium:               Appears normal         Aortic Arch:            Appears normal
Cavum:                 Appears normal         Ductal Arch:            Previously seen
Ventricles:            Appears normal         Diaphragm:              Appears normal
Choroid Plexus:        Appears normal         Stomach:                Appears normal, left
sided
Cerebellum:            Appears normal         Abdomen:                Appears normal
Posterior Fossa:       Appears normal         Abdominal Wall:         Appears nml (cord
insert, abd wall)
Nuchal Fold:           Appears normal         Cord Vessels:           Appears normal (3
vessel cord)
Face:                  Appears normal         Kidneys:                Appear normal
(orbits and profile)
Lips:                  Appears normal         Bladder:                Appears normal
Thoracic:              Appears normal         Spine:                  Appears normal
Heart:                 Previously seen        Upper Extremities:      Previously seen
RVOT:                  Appears normal         Lower Extremities:      Previously seen
LVOT:                  Appears normal

Other:  Fetus appears to be a male. Heels and 5th digit visualized. Nasal
bone visualized. Technically difficult due to maternal habitus and fetal
position.
Cervix Uterus Adnexa

Cervix
Length:           2.98  cm.
Normal appearance by transabdominal scan.

Uterus
No abnormality visualized.

Left Ovary
Size(cm)     2.35   x   2.19   x  2.05      Vol(ml):
Within normal limits. No adnexal mass visualized.

Right Ovary
Size(cm)     3.28   x   3.12   x  2.61      Vol(ml):  14
Within normal limits. No adnexal mass visualized.

Cul De Sac:   No free fluid seen.

Adnexa:       No abnormality visualized.
Impression

SIUP at 19+5 weeks
Normal interval anatomy; anatomic survey complete
Normal amniotic fluid volume
Appropriate interval growth
Recommendations

Follow-up as clinically indicated

## 2018-07-17 IMAGING — CR DG CHEST 2V
2 series · 2 of 2 positions shown · non-contrast
Comparison: 03/05/2016

CLINICAL DATA: Persistent cough.

EXAM:
CHEST  2 VIEW

[chest pa]
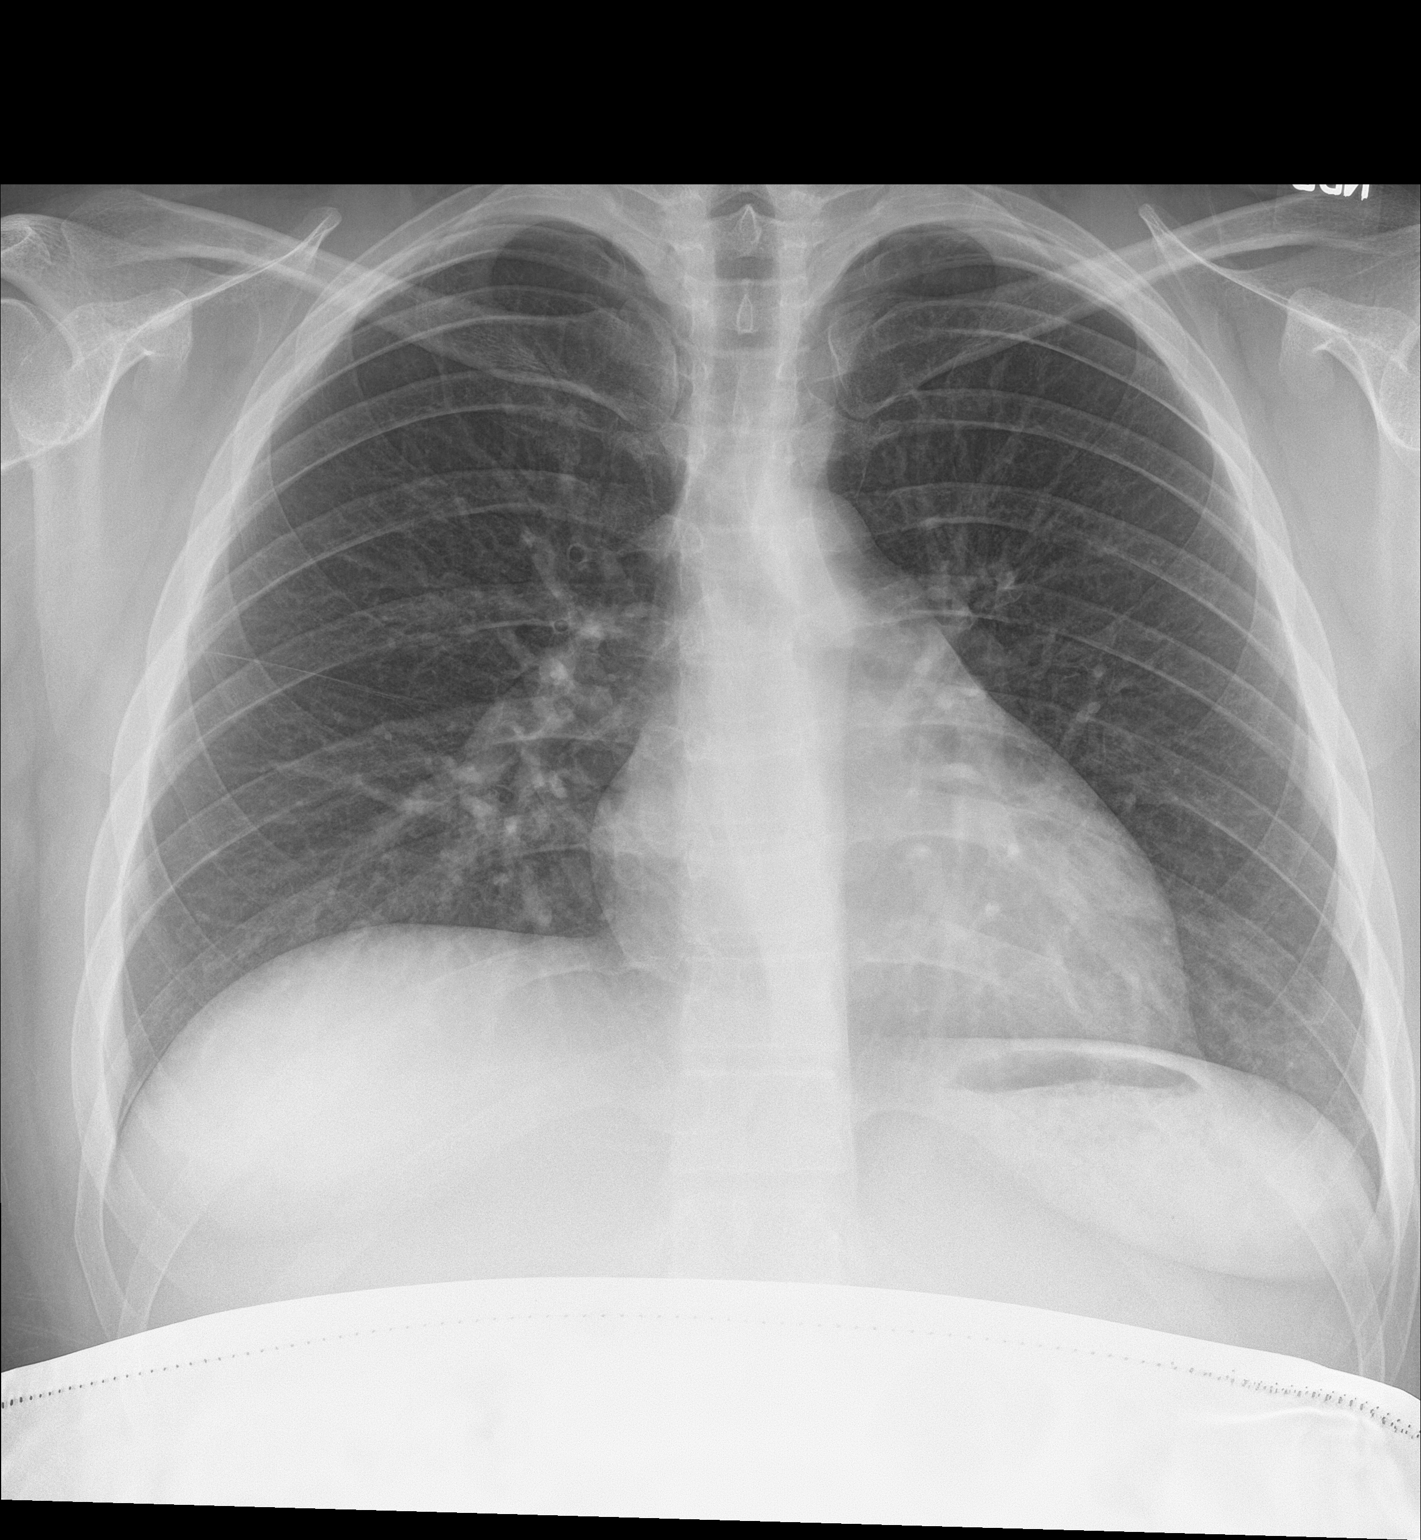

[chest lat]
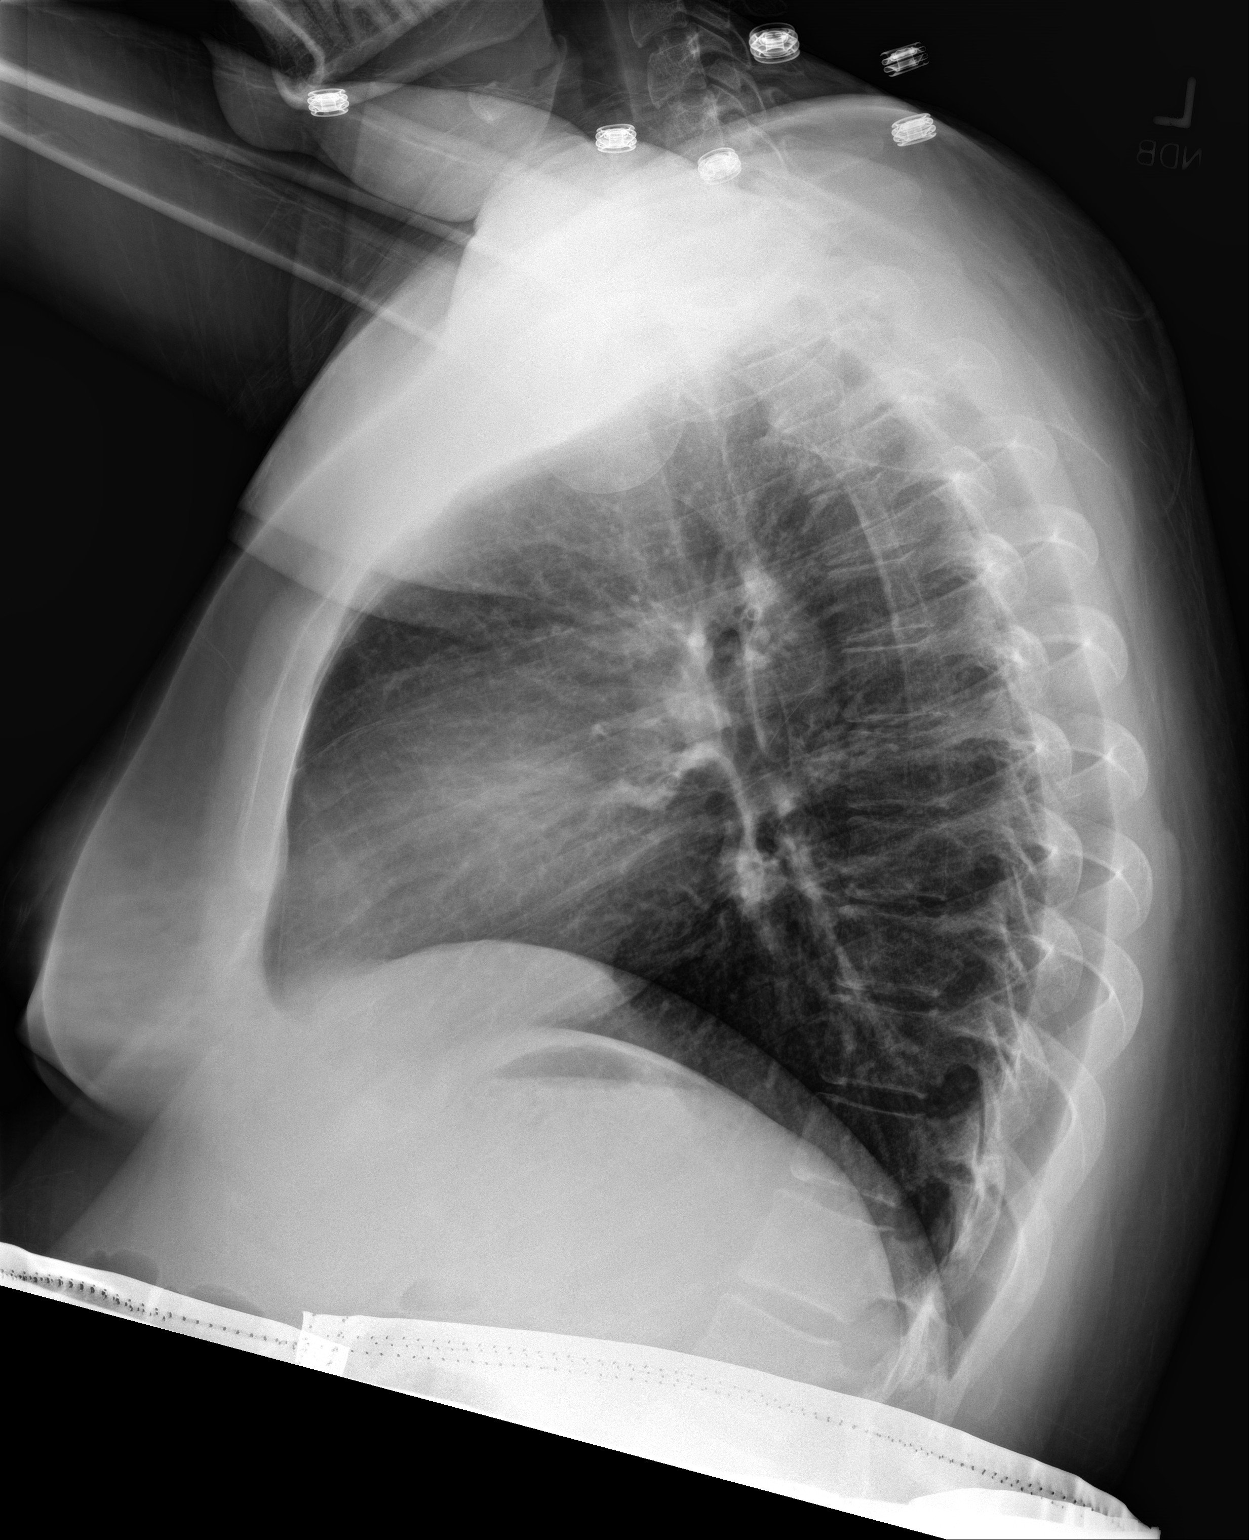

[2 of 2 positions shown; findings below may reference images not displayed]

FINDINGS: The heart size and mediastinal contours are within normal limits.
Both lungs are clear. The visualized skeletal structures are
unremarkable.
IMPRESSION: No active cardiopulmonary disease.

## 2018-12-17 ENCOUNTER — Telehealth: Payer: Self-pay | Admitting: General Practice

## 2018-12-17 NOTE — Telephone Encounter (Signed)
Patient called and left message on nurse voicemail line stating she is having pain with her IUD and would like a call back. Called patient and she states she talked with someone at the front desk and is going to come in during after hours on Monday. Patient had no questions.

## 2018-12-20 ENCOUNTER — Encounter: Payer: Self-pay | Admitting: Nurse Practitioner

## 2018-12-20 ENCOUNTER — Ambulatory Visit (INDEPENDENT_AMBULATORY_CARE_PROVIDER_SITE_OTHER): Payer: Commercial Managed Care - PPO | Admitting: Nurse Practitioner

## 2018-12-20 ENCOUNTER — Other Ambulatory Visit: Payer: Self-pay

## 2018-12-20 VITALS — BP 139/90 | HR 79 | Ht 62.0 in | Wt 158.5 lb

## 2018-12-20 DIAGNOSIS — R109 Unspecified abdominal pain: Secondary | ICD-10-CM

## 2018-12-20 DIAGNOSIS — Z30431 Encounter for routine checking of intrauterine contraceptive device: Secondary | ICD-10-CM | POA: Diagnosis not present

## 2018-12-20 DIAGNOSIS — R030 Elevated blood-pressure reading, without diagnosis of hypertension: Secondary | ICD-10-CM | POA: Diagnosis not present

## 2018-12-20 NOTE — Progress Notes (Signed)
Pain in Pelvic area

## 2018-12-20 NOTE — Progress Notes (Signed)
GYNECOLOGY OFFICE VISIT NOTE   History:  23 y.o. G1P1001 here today for periodic pain in abdomen that she thinks is coming from her IUD.  One evening last week, she was awake most of the night with the pain and she thought the IUD must be going into her cervix.  Was not having any bleeding and did not take any medication.  Another day last week she had abdominal pain for a couple of hours.  Took Tylenol and the pain resolved.  Did not have any more nights where she was having difficulty sleeping.  She denies any abnormal vaginal discharge, bleeding, pelvic pain or other concerns.   Past Medical History:  Diagnosis Date  . Headache   . Influenza 02/2016  . Medical history non-contributory     Past Surgical History:  Procedure Laterality Date  . CESAREAN SECTION N/A 05/04/2016   Procedure: CESAREAN SECTION;  Surgeon: Guss Bunde, MD;  Location: [place];  Service: Obstetrics;  Laterality: N/A;  . NO PAST SURGERIES      The following portions of the patient's history were reviewed and updated as appropriate: allergies, current medications, past family history, past medical history, past social history, past surgical history and problem list.   Health Maintenance:  Normal pap on 09-22-17    Review of Systems:  Pertinent items noted in HPI and remainder of comprehensive ROS otherwise negative.  Objective:  Physical Exam BP 139/90 (BP Location: Right Arm)   Pulse 79   Ht 5\' 2"  (1.575 m)   Wt 158 lb 8 oz (71.9 kg)   BMI 28.99 kg/m  CONSTITUTIONAL: Well-developed, well-nourished female in no acute distress.  HENT:  Normocephalic, atraumatic. External right and left ear normal.  EYES: Conjunctivae and EOM are normal. Pupils are equal, round.  No scleral icterus.  NECK: Normal range of motion, supple, no masses SKIN: Skin is warm and dry. No rash noted. Not diaphoretic. No erythema. No pallor. NEUROLOGIC: Alert and oriented to person, place, and time. Normal muscle tone  coordination. No cranial nerve deficit noted. PSYCHIATRIC: Normal mood and affect. Normal behavior. Normal judgment and thought content. ABDOMEN: Soft, no distention noted.   PELVIC: Normal appearing external genitalia; normal appearing vaginal mucosa and cervix.  No abnormal discharge noted.  Normal uterine size, no other palpable masses, no uterine or adnexal tenderness. IUD strings seen.  No part of the IUD palpated on bimanual exam. MUSCULOSKELETAL: Normal range of motion. No edema noted.  Labs and Imaging No results found.  Assessment & Plan:  1. Surveillance of intrauterine contraceptive device Seems in place and no tenderness on exam  2. Abdominal pain, unspecified abdominal location Reviewed that some pain can occur with IUD.  Encouraged to take pain medication and to return if the pain is persistent.  3. Elevated BP without diagnosis of hypertension With history of gestational hypertension, need to watch BP closely.  Advised establishing with a PCP and getting a BP cuff to check BP at home.  Will want to have BP under 130/80 if possible before attempting to become pregnant again.   Routine preventative health maintenance measures emphasized. Please refer to After Visit Summary for other counseling recommendations.   Return in about 1 year (around 12/20/2019).   Total face-to-face time with patient: 10 minutes.  Over 50% of encounter was spent on counseling and coordination of care.  Earlie Server, RN, MSN, NP-BC Nurse Practitioner, [facility] for [employer], [place] [facility] 12/21/2018 11:14 AM

## 2019-05-04 ENCOUNTER — Ambulatory Visit: Payer: Commercial Managed Care - PPO | Admitting: Nurse Practitioner

## 2019-06-07 ENCOUNTER — Telehealth: Payer: Self-pay | Admitting: Nurse Practitioner

## 2019-06-07 NOTE — Telephone Encounter (Signed)
Patient is requesting a call back. She is having some issues with her IUD, and she can't feel the string.

## 2019-06-07 NOTE — Telephone Encounter (Signed)
Called patient and she states she is worried because she has been having abdominal pain/cramps similar to a period but ibuprofen/tylenol isn't helping. She states she also hasn't been able to feel her strings lately. Discussed her body will still go through a monthly cycle even if she isn't getting a period and that can cause cramping. Reassured patient it should improve. Discussed strings can curl up around the cervix and be difficult to feel but she would've likely noticed if the IUD came out. Told patient the only way to be completely sure is coming in for an exam. Patient states she has an appt in 2 weeks to have the IUD removed. Patient asked how she would know if the IUD is in her cervix or not where it is supposed to be. Told patient it would usually be associated with irregular bleeding or significant vagina pressure/pain. Patient verbalized understanding and states she will just wait until her appt or if it gets worse, she'll call for a sooner appt.

## 2019-06-27 ENCOUNTER — Ambulatory Visit (INDEPENDENT_AMBULATORY_CARE_PROVIDER_SITE_OTHER): Payer: Commercial Managed Care - PPO | Admitting: Nurse Practitioner

## 2019-06-27 ENCOUNTER — Encounter: Payer: Self-pay | Admitting: Nurse Practitioner

## 2019-06-27 ENCOUNTER — Other Ambulatory Visit: Payer: Self-pay

## 2019-06-27 VITALS — BP 135/98 | HR 82 | Ht 62.0 in | Wt 161.0 lb

## 2019-06-27 DIAGNOSIS — Z3009 Encounter for other general counseling and advice on contraception: Secondary | ICD-10-CM | POA: Diagnosis not present

## 2019-06-27 DIAGNOSIS — O10919 Unspecified pre-existing hypertension complicating pregnancy, unspecified trimester: Secondary | ICD-10-CM | POA: Insufficient documentation

## 2019-06-27 DIAGNOSIS — Z975 Presence of (intrauterine) contraceptive device: Secondary | ICD-10-CM

## 2019-06-27 DIAGNOSIS — I10 Essential (primary) hypertension: Secondary | ICD-10-CM

## 2019-06-27 NOTE — Patient Instructions (Signed)
Previous blood pressures: 139/90 on 12-21-18 135/98 on 06-27-19

## 2019-06-27 NOTE — Progress Notes (Signed)
   GYNECOLOGY OFFICE VISIT NOTE   History:  24 y.o. G1P1001 here today for IUD removal - could not find strings at home and worried it is not in the right place but now pain at present and no severe pain in the past week. She denies any abnormal vaginal discharge, bleeding, pelvic pain or other concerns.   Past Medical History:  Diagnosis Date  . Headache   . Influenza 02/2016  . Medical history non-contributory     Past Surgical History:  Procedure Laterality Date  . CESAREAN SECTION N/A 05/04/2016   Procedure: CESAREAN SECTION;  Surgeon: Lesly Dukes, MD;  Location: [facility] SU[facility];  Service: Obstetrics;  Laterality: N/A;  . NO PAST SURGERIES      The following portions of the patient's history were reviewed and updated as appropriate: allergies, current medications, past family history, past medical history, past social history, past surgical history and problem list.   Health Maintenance:  Normal pap on *09-23-17.   Review of Systems:  Pertinent items noted in HPI and remainder of comprehensive ROS otherwise negative.  Objective:  Physical Exam BP (!) 135/98   Pulse 82   Ht 5\' 2"  (1.575 m)   Wt 161 lb (73 kg)   BMI 29.45 kg/m  CONSTITUTIONAL: Well-developed, well-nourished female in no acute distress.  HENT:  Normocephalic, atraumatic. External right and left ear normal.  EYES: Conjunctivae and EOM are normal. Pupils are equal, round.  No scleral icterus.  NECK: Normal range of motion, supple, no masses SKIN: Skin is warm and dry. No rash noted. Not diaphoretic. No erythema. No pallor. NEUROLOGIC: Alert and oriented to person, place, and time. Normal muscle tone coordination. No cranial nerve deficit noted. PSYCHIATRIC: Normal mood and affect. Normal behavior. Normal judgment and thought content. CARDIOVASCULAR: Normal heart rate noted RESPIRATORY: Effort and breath sounds normal, no problems with respiration noted ABDOMEN: Soft, no distention noted.   PELVIC:  Deferred MUSCULOSKELETAL: Normal range of motion. No edema noted.  Labs and Imaging No results found.  Assessment & Plan:  1. Hypertension, unspecified type Discussed hypertension at last visit and advised her to see PCP, but client thought the elevated BP in that office visit was related to the food she ate the night before.  Since she was not feeling bad, she thought the BP was normal.  Discussed the importance of having chronic conditions managed well prior to pregnancy for a health pregnancy and long term good health for the mother.    Client agrees to see her PCP and recorded her last 2 BPs for her to take.  2. Presence of IUD Decided to see PCP before IUD removal to have BP stablized before trying to become pregnant.  Anticipates returning in next few months for removal because she wants to conceive.  Strings seen for IUD at last pelvic exam so due to no pain currently, do not need to have pelvic exam today.  3.  Family Planning counseling Advised to have chronic HTN evaluated before IUD removal and attempting to conceive.  Routine preventative health maintenance measures emphasized. Please refer to After Visit Summary for other counseling recommendations.   Return for call for appointment after you see your PCP.   Total face-to-face time with patient: 15 minutes.  Over 50% of encounter was spent on counseling and coordination of care.  , RN, MSN, NP-BC Nurse Practitioner, [facility] for [facility] 06/27/2019 10:13 AM

## 2020-01-31 DIAGNOSIS — O0990 Supervision of high risk pregnancy, unspecified, unspecified trimester: Secondary | ICD-10-CM | POA: Insufficient documentation

## 2020-01-31 DIAGNOSIS — Z3480 Encounter for supervision of other normal pregnancy, unspecified trimester: Secondary | ICD-10-CM | POA: Insufficient documentation

## 2020-02-01 ENCOUNTER — Ambulatory Visit (INDEPENDENT_AMBULATORY_CARE_PROVIDER_SITE_OTHER): Payer: Self-pay

## 2020-02-01 DIAGNOSIS — Z3480 Encounter for supervision of other normal pregnancy, unspecified trimester: Secondary | ICD-10-CM

## 2020-02-01 DIAGNOSIS — Z3A11 11 weeks gestation of pregnancy: Secondary | ICD-10-CM

## 2020-02-01 DIAGNOSIS — Z3481 Encounter for supervision of other normal pregnancy, first trimester: Secondary | ICD-10-CM

## 2020-02-01 MED ORDER — BLOOD PRESSURE KIT DEVI: 1.0000 | 0 refills | Status: DC

## 2020-02-01 NOTE — Progress Notes (Signed)
  Virtual Visit via Telephone Note  I connected with Deborah Steele on 02/01/20 at  2:00 PM EST by telephone and verified that I am speaking with the correct person using two identifiers.  Location: Patient: Work Provider: [facility]   I discussed the limitations, risks, security and privacy concerns of performing an evaluation and management service by telephone and the availability of in person appointments. I also discussed with the patient that there may be a patient responsible charge related to this service. The patient expressed understanding and agreed to proceed.   History of Present Illness: PRENATAL INTAKE SUMMARY  Deborah Steele presents today New OB Nurse Interview.  OB History    Gravida  2   Para  1   Term  1   Preterm  0   AB  0   Living  1     SAB  0   TAB  0   Ectopic  0   Multiple  0   Live Births  1          I have reviewed the patient's medical, obstetrical, social, and family histories, medications, and available lab results.  SUBJECTIVE She has no unusual complaints   Observations/Objective: Initial nurse interview for history/labs (New OB) LMP: 11/16/2019 EDD: 08/22/2020 GA: [redacted]w[redacted]d G 2 P1 FHT:   GENERAL APPEARANCE: oriented to person, place and time  Assessment and Plan: Normal pregnancy Prenatal care: [facility] Labs to be completed at [facility] visit with Dr. Clearance Coots on 02/07/2020 @ 10 am.   Follow Up Instructions:   I discussed the assessment and treatment plan with the patient. The patient was provided an opportunity to ask questions and all were answered. The patient agreed with the plan and demonstrated an understanding of the instructions.   The patient was advised to call back or seek an in-person evaluation if the symptoms worsen or if the condition fails to improve as anticipated.  I provided 15 minutes of non-face-to-face time during this encounter.   Maretta Bees, RMA

## 2020-02-02 NOTE — Progress Notes (Signed)
I reviewed the nurses note and agree with the plan of care.   Sarahi Borland A, MD 06/19/2017 10:46 AM  

## 2020-02-07 ENCOUNTER — Ambulatory Visit (INDEPENDENT_AMBULATORY_CARE_PROVIDER_SITE_OTHER): Payer: BC Managed Care – PPO | Admitting: Obstetrics

## 2020-02-07 ENCOUNTER — Other Ambulatory Visit: Payer: Self-pay

## 2020-02-07 DIAGNOSIS — Z3480 Encounter for supervision of other normal pregnancy, unspecified trimester: Secondary | ICD-10-CM

## 2020-02-09 NOTE — Progress Notes (Signed)
Appointment cancelled.  Brock Bad, MD 02/09/2020 8:24 AM

## 2020-02-22 ENCOUNTER — Encounter: Payer: Self-pay | Admitting: Obstetrics

## 2020-02-22 ENCOUNTER — Other Ambulatory Visit: Payer: Self-pay

## 2020-02-22 ENCOUNTER — Ambulatory Visit (INDEPENDENT_AMBULATORY_CARE_PROVIDER_SITE_OTHER): Payer: BC Managed Care – PPO | Admitting: Obstetrics

## 2020-02-22 VITALS — BP 115/78 | HR 86 | Wt 168.0 lb

## 2020-02-22 DIAGNOSIS — Z8759 Personal history of other complications of pregnancy, childbirth and the puerperium: Secondary | ICD-10-CM

## 2020-02-22 DIAGNOSIS — O0990 Supervision of high risk pregnancy, unspecified, unspecified trimester: Secondary | ICD-10-CM | POA: Diagnosis not present

## 2020-02-22 MED ORDER — ASPIRIN 81 MG PO CHEW: 81.0000 mg | CHEWABLE_TABLET | Freq: Every day | ORAL | 6 refills | Status: DC

## 2020-02-22 NOTE — Progress Notes (Signed)
Subjective:    Deborah Steele is being seen today for her first obstetrical visit.  This is a planned pregnancy. She is at [redacted]w[redacted]d gestation. Her obstetrical history is significant for pre-eclampsia. Relationship with FOB: supportive. Patient does intend to breast feed. Pregnancy history fully reviewed.  The information documented in the HPI was reviewed and verified.  Menstrual History: OB History    Gravida  2   Para  1   Term  1   Preterm  0   AB  0   Living  1     SAB  0   TAB  0   Ectopic  0   Multiple  0   Live Births  1          Patient's last menstrual period was 11/16/2019 (approximate).    Past Medical History:  Diagnosis Date  . Headache   . Influenza 02/2016  . Medical history non-contributory     Past Surgical History:  Procedure Laterality Date  . CESAREAN SECTION N/A 05/04/2016   Procedure: CESAREAN SECTION;  Surgeon: Lesly Dukes, MD;  Location: [facility];  Service: Obstetrics;  Laterality: N/A;    (Not in a hospital admission)  No Known Allergies  Social History   Tobacco Use  . Smoking status: Never Smoker  . Smokeless tobacco: Never Used  Substance Use Topics  . Alcohol use: No    Family History  Problem Relation Age of Onset  . Hyperlipidemia Mother   . Hypertension Mother   . Asthma Other      Review of Systems Constitutional: negative for weight loss Gastrointestinal: negative for vomiting Genitourinary:negative for genital lesions and vaginal discharge and dysuria Musculoskeletal:negative for back pain Behavioral/Psych: negative for abusive relationship, depression, illegal drug usage and tobacco use    Objective:    BP 115/78   Pulse 86   Wt 168 lb (76.2 kg)   LMP 11/16/2019 (Approximate)   BMI 30.73 kg/m  General Appearance:    Alert, cooperative, no distress, appears stated age  Head:    Normocephalic, without obvious abnormality, atraumatic  Eyes:    PERRL, conjunctiva/corneas clear, EOM's intact,  fundi    benign, both eyes  Ears:    Normal TM's and external ear canals, both ears  Nose:   Nares normal, septum midline, mucosa normal, no drainage    or sinus tenderness  Throat:   Lips, mucosa, and tongue normal; teeth and gums normal  Neck:   Supple, symmetrical, trachea midline, no adenopathy;    thyroid:  no enlargement/tenderness/nodules; no carotid   bruit or JVD  Back:     Symmetric, no curvature, ROM normal, no CVA tenderness  Lungs:     Clear to auscultation bilaterally, respirations unlabored  Chest Wall:    No tenderness or deformity   Heart:    Regular rate and rhythm, S1 and S2 normal, no murmur, rub   or gallop  Breast Exam:    No tenderness, masses, or nipple abnormality  Abdomen:     Soft, non-tender, bowel sounds active all four quadrants,    no masses, no organomegaly  Genitalia:    Normal female without lesion, discharge or tenderness  Extremities:   Extremities normal, atraumatic, no cyanosis or edema  Pulses:   2+ and symmetric all extremities  Skin:   Skin color, texture, turgor normal, no rashes or lesions  Lymph nodes:   Cervical, supraclavicular, and axillary nodes normal  Neurologic:   CNII-XII intact, normal strength, sensation  and reflexes    throughout      Lab Review Urine pregnancy test Labs reviewed ( records not available ) Radiologic studies reviewed ( records not available )  Assessment:    Pregnancy at [redacted]w[redacted]d weeks    Plan:     1. Supervision of high risk pregnancy, antepartum Rx: - CBC/D/Plt+RPR+Rh+ABO+Rub Ab... - Culture, OB Urine - [place] MFM OB COMP + 14 WK; Future  2. H/O pre-eclampsia Rx: - aspirin 81 MG chewable tablet; Chew 1 tablet (81 mg total) by mouth daily.  Dispense: 30 tablet; Refill: 6   Prenatal vitamins.  Counseling provided regarding continued use of seat belts, cessation of alcohol consumption, smoking or use of illicit drugs; infection precautions i.e., influenza/TDAP immunizations, toxoplasmosis,CMV, parvovirus,  listeria and varicella; workplace safety, exercise during pregnancy; routine dental care, safe medications, sexual activity, hot tubs, saunas, pools, travel, caffeine use, fish and methlymercury, potential toxins, hair treatments, varicose veins Weight gain recommendations per IOM guidelines reviewed: underweight/BMI< 18.5--> gain 28 - 40 lbs; normal weight/BMI 18.5 - 24.9--> gain 25 - 35 lbs; overweight/BMI 25 - 29.9--> gain 15 - 25 lbs; obese/BMI >30->gain  11 - 20 lbs Problem list reviewed and updated. FIRST/CF mutation testing/NIPT/QUAD SCREEN/fragile X/Ashkenazi Jewish population testing/Spinal muscular atrophy discussed: declined. Role of ultrasound in pregnancy discussed; fetal survey: requested. Amniocentesis discussed: not indicated.  Meds ordered this encounter  Medications  . aspirin 81 MG chewable tablet    Sig: Chew 1 tablet (81 mg total) by mouth daily.    Dispense:  30 tablet    Refill:  6   Orders Placed This Encounter  Procedures  . Culture, OB Urine  . [place] MFM OB COMP + 14 WK    Standing Status:   Future    Standing Expiration Date:   02/21/2021    Order Specific Question:   Reason for Exam (SYMPTOM  OR DIAGNOSIS REQUIRED)    Answer:   Anatomy    Order Specific Question:   Preferred Location    Answer:   [facility] Ultrasound  . CBC/D/Plt+RPR+Rh+ABO+Rub Ab...    Follow up in 2 weeks. 50% of 20 min visit spent on counseling and coordination of care.    Brock Bad, MD 02/22/2020 4:59 PM

## 2020-02-22 NOTE — Progress Notes (Signed)
NEW OB, declined Genetic screening and FLU vaccine.  Reports no problems today. Patient needs to sign Release of Information from her Provider in [place].

## 2020-02-23 LAB — CBC/D/PLT+RPR+RH+ABO+RUB AB...
Antibody Screen: NEGATIVE
Basophils Absolute: 0 10*3/uL (ref 0.0–0.2)
Basos: 0 %
EOS (ABSOLUTE): 0.1 10*3/uL (ref 0.0–0.4)
Eos: 1 %
HCV Ab: 0.2 s/co ratio (ref 0.0–0.9)
HIV Screen 4th Generation wRfx: NONREACTIVE
Hematocrit: 34.7 % (ref 34.0–46.6)
Hemoglobin: 12 g/dL (ref 11.1–15.9)
Hepatitis B Surface Ag: NEGATIVE
Immature Grans (Abs): 0 10*3/uL (ref 0.0–0.1)
Immature Granulocytes: 0 %
Lymphocytes Absolute: 1.5 10*3/uL (ref 0.7–3.1)
Lymphs: 16 %
MCH: 29.4 pg (ref 26.6–33.0)
MCHC: 34.6 g/dL (ref 31.5–35.7)
MCV: 85 fL (ref 79–97)
Monocytes Absolute: 0.6 10*3/uL (ref 0.1–0.9)
Monocytes: 6 %
Neutrophils Absolute: 7.1 10*3/uL — ABNORMAL HIGH (ref 1.4–7.0)
Neutrophils: 77 %
Platelets: 322 10*3/uL (ref 150–450)
RBC: 4.08 x10E6/uL (ref 3.77–5.28)
RDW: 11.9 % (ref 11.7–15.4)
RPR Ser Ql: NONREACTIVE
Rh Factor: POSITIVE
Rubella Antibodies, IGG: 14.5 index (ref >0.99)
WBC: 9.2 10*3/uL (ref 3.4–10.8)

## 2020-02-23 LAB — HCV INTERPRETATION

## 2020-02-24 LAB — CULTURE, OB URINE

## 2020-02-24 LAB — URINE CULTURE, OB REFLEX

## 2020-03-06 ENCOUNTER — Other Ambulatory Visit: Payer: Self-pay

## 2020-03-06 ENCOUNTER — Ambulatory Visit (INDEPENDENT_AMBULATORY_CARE_PROVIDER_SITE_OTHER): Payer: BC Managed Care – PPO | Admitting: Obstetrics and Gynecology

## 2020-03-06 ENCOUNTER — Encounter: Payer: Self-pay | Admitting: Obstetrics and Gynecology

## 2020-03-06 VITALS — BP 131/85 | HR 90 | Wt 168.0 lb

## 2020-03-06 DIAGNOSIS — Z8759 Personal history of other complications of pregnancy, childbirth and the puerperium: Secondary | ICD-10-CM | POA: Diagnosis not present

## 2020-03-06 DIAGNOSIS — N643 Galactorrhea not associated with childbirth: Secondary | ICD-10-CM | POA: Diagnosis not present

## 2020-03-06 DIAGNOSIS — O0990 Supervision of high risk pregnancy, unspecified, unspecified trimester: Secondary | ICD-10-CM

## 2020-03-06 DIAGNOSIS — I10 Essential (primary) hypertension: Secondary | ICD-10-CM

## 2020-03-06 NOTE — Patient Instructions (Signed)
Galactorrhea Galactorrhea is the flow of a milky fluid (discharge) from the breast. It is different from normal milk in nursing mothers. The fluid can be white, yellow, or green. This condition can be caused by many things. Most cases are not serious and do not require treatment. Watch your condition to make sure it goes away. Follow these instructions at home: Breast care   Watch your condition for any changes.  Do not squeeze your breasts or nipples.  Avoid touching your breasts when you are having sex.  Perform a breast self-exam once a month.  Avoid clothes that rub on your nipples.  Use breast pads to absorb the fluid.  Wear a support bra or a breast binder. General instructions  Take over-the-counter and prescription medicines only as told by your doctor.  Keep all follow-up visits as told by your doctor. This is important. Contact a doctor if you:  Have hot flashes.  Have vaginal dryness.  Have no desire for sex.  Stop having periods, or have periods that are irregular or far apart.  Have headaches.  Cannot see well. Get help right away if:  Your breast discharge is bloody or yellowish white (puslike).  You have breast pain.  You feel a lump in your breast.  Your breast shows wrinkling or dimpling.  Your breast becomes red and swollen. Summary  Galactorrhea is the flow of a milky fluid (discharge) from the breast.  This condition may be caused by many things, but it is not usually serious.  Watch your condition carefully to make sure that it goes away.  Get help right away if the fluid is bloody or yellowish white, or if you have a lump, pain, or skin changes on your breast. This information is not intended to replace advice given to you by your health care provider. Make sure you discuss any questions you have with your health care provider. Document Revised: 03/23/2017 Document Reviewed: 03/23/2017 Elsevier Patient Education  2020 [employer]  

## 2020-03-06 NOTE — Progress Notes (Signed)
   PRENATAL VISIT NOTE  Subjective:  Deborah Steele is a 24 y.o. G2P1001 at [redacted]w[redacted]d being seen today for ongoing prenatal care.  She is currently monitored for the following issues for this high-risk pregnancy and has Needle stick, hypodermic, accidental; History of gestational hypertension; Hypertension; and Supervision of other normal pregnancy, antepartum on their problem list.  Patient reports bilateral nipple discharge prior to pregnancy of brown/green substance, and itching of left breast.  Contractions: Not present. Vag. Bleeding: None.   . Denies leaking of fluid.   The following portions of the patient's history were reviewed and updated as appropriate: allergies, current medications, past family history, past medical history, past social history, past surgical history and problem list.   Objective:   Vitals:   03/06/20 1520  BP: 131/85  Pulse: 90  Weight: 168 lb (76.2 kg)    Fetal Status: Fetal Heart Rate (bpm): 160         General:  Alert, oriented and cooperative. Patient is in no acute distress.  Skin: Skin is warm and dry. No rash noted.   Cardiovascular: Normal heart rate noted  Respiratory: Normal respiratory effort, no problems with respiration noted  Abdomen: Soft, gravid, appropriate for gestational age.  Pain/Pressure: Absent     Pelvic: Cervical exam deferred        Extremities: Normal range of motion.  Edema: None  Mental Status: Normal mood and affect. Normal behavior. Normal judgment and thought content.   Assessment and Plan:  Pregnancy: G2P1001 at [redacted]w[redacted]d 1. Galactorrhea on both sides - Bilateral breast ultrasound ordered  2. H/O pre-eclampsia - Continue ASA. - PreEclampsia precautions discussed.  3. Hypertension, unspecified type - Continue ASA.  4. Supervision of high risk pregnancy, antepartum - Patient for AFP today. - Anatomy scan already ordered.  Preterm labor symptoms and general obstetric precautions including but not limited to vaginal  bleeding, contractions, leaking of fluid and fetal movement were reviewed in detail with the patient. Please refer to After Visit Summary for other counseling recommendations.   Return in about 4 weeks (around 04/03/2020) for HROB.  Future Appointments  Date Time Provider Department Center  03/06/2020  4:15 PM Johnny Bridge, MD [facility] None  04/02/2020  1:45 PM [facility] US5 [facility]    Johnny Bridge, MD

## 2020-03-06 NOTE — Progress Notes (Signed)
ROB/AFP c/o pelvic cramps at the end of the day and left breast nipple itching.

## 2020-03-08 ENCOUNTER — Encounter: Payer: Self-pay | Admitting: Obstetrics & Gynecology

## 2020-03-08 DIAGNOSIS — O280 Abnormal hematological finding on antenatal screening of mother: Secondary | ICD-10-CM | POA: Insufficient documentation

## 2020-03-08 LAB — AFP, SERUM, OPEN SPINA BIFIDA
AFP MoM: 3.33
AFP Value: 98.9 ng/mL
Gest. Age on Collection Date: 15.6 weeks
Maternal Age At EDD: 27 yr
OSBR Risk 1 IN: 69
Test Results:: POSITIVE — AB
Weight: 168 [lb_av]

## 2020-03-12 ENCOUNTER — Other Ambulatory Visit: Payer: Self-pay | Admitting: *Deleted

## 2020-03-12 DIAGNOSIS — O280 Abnormal hematological finding on antenatal screening of mother: Secondary | ICD-10-CM

## 2020-03-12 DIAGNOSIS — O0990 Supervision of high risk pregnancy, unspecified, unspecified trimester: Secondary | ICD-10-CM

## 2020-03-12 NOTE — Progress Notes (Signed)
Pt made aware of AFP results and referral to MFM has been entered.

## 2020-03-24 NOTE — L&D Delivery Note (Signed)
OB/GYN Faculty Practice Delivery Note  Deborah Steele is a 25 y.o. G2P1001 s/p vaginal delivery at [redacted]w[redacted]d. She was admitted for IUFD.  ROM: 0h 77m with clear fluid GBS Status: unknown Maximum Maternal Temperature: 99.53F  Labor Progress: Pt received cytotec x3 on admission. Given lack of cervical change, a FB was placed s/p shared decision making with pt. An epidural was then placed per pt request and she was found to have complete cervical dilation at 0147. Cephalic fetal presentation was confirmed on ultrasound given inability to palpation presenting part. AROM for clear fluid at 0208 with subsequent delivery as noted below. Pt declined genetic testing and autopsy.  Delivery Date/Time: 04/18/20 at 0210 Delivery: Called to room and patient was complete. Head delivered vertex. No nuchal cord present. Shoulder and body delivered in usual fashion. Infant was handed to assisting nurse per pt request. Uterine massage performed s/p delivery with rapid lengthening of umbilical cord. Cord avulsion with manual extraction of placenta; confirmed smooth endometrial surface on subsequent uterine sweep. Fundus firm with massage and Pitocin. Labia, perineum, vagina, and cervix were inspected, without evidence of lacerations.  Placenta: multiple clots visible, intact s/p manual extraction, sent to pathology Complications: cord avulsion s/p manual extraction of placenta Lacerations: none EBL: 175 ml Analgesia: epidural  Infant: non-viable female  Lynnda Shields, MD OB/GYN Fellow, Faculty Practice

## 2020-04-02 ENCOUNTER — Other Ambulatory Visit: Payer: Self-pay

## 2020-04-02 ENCOUNTER — Ambulatory Visit: Payer: BC Managed Care – PPO | Admitting: *Deleted

## 2020-04-02 ENCOUNTER — Other Ambulatory Visit: Payer: Self-pay | Admitting: Obstetrics

## 2020-04-02 ENCOUNTER — Ambulatory Visit: Payer: BC Managed Care – PPO | Attending: Obstetrics

## 2020-04-02 ENCOUNTER — Encounter: Payer: BC Managed Care – PPO | Admitting: Obstetrics and Gynecology

## 2020-04-02 ENCOUNTER — Encounter: Payer: Self-pay | Admitting: *Deleted

## 2020-04-02 ENCOUNTER — Ambulatory Visit (HOSPITAL_BASED_OUTPATIENT_CLINIC_OR_DEPARTMENT_OTHER): Payer: BC Managed Care – PPO | Admitting: Genetic Counselor

## 2020-04-02 DIAGNOSIS — O280 Abnormal hematological finding on antenatal screening of mother: Secondary | ICD-10-CM | POA: Insufficient documentation

## 2020-04-02 DIAGNOSIS — O365990 Maternal care for other known or suspected poor fetal growth, unspecified trimester, not applicable or unspecified: Secondary | ICD-10-CM

## 2020-04-02 DIAGNOSIS — O34219 Maternal care for unspecified type scar from previous cesarean delivery: Secondary | ICD-10-CM | POA: Diagnosis present

## 2020-04-02 DIAGNOSIS — O283 Abnormal ultrasonic finding on antenatal screening of mother: Secondary | ICD-10-CM | POA: Diagnosis not present

## 2020-04-02 DIAGNOSIS — R772 Abnormality of alphafetoprotein: Secondary | ICD-10-CM

## 2020-04-02 DIAGNOSIS — O281 Abnormal biochemical finding on antenatal screening of mother: Secondary | ICD-10-CM | POA: Insufficient documentation

## 2020-04-02 DIAGNOSIS — O0990 Supervision of high risk pregnancy, unspecified, unspecified trimester: Secondary | ICD-10-CM | POA: Insufficient documentation

## 2020-04-02 DIAGNOSIS — Z315 Encounter for genetic counseling: Secondary | ICD-10-CM | POA: Diagnosis present

## 2020-04-02 DIAGNOSIS — O365911 Maternal care for other known or suspected poor fetal growth, first trimester, fetus 1: Secondary | ICD-10-CM

## 2020-04-02 NOTE — Progress Notes (Signed)
04/02/2020  Deborah Steele May 21, 1995 MRN: [id number] DOV: 04/02/2020  Deborah Steele presented to the [facility] for a genetics consultation regarding her increased MS-AFP result and ultrasound findings of echogenic bowel and fetal growth restriction. Deborah Steele presented to her appointment by alone.   Indication for genetic counseling - Increased MS-AFP (3.33 MoM) - Echogenic bowel & fetal growth restriction on ultrasound  Prenatal history  Deborah Steele is a G37P1001, 25 y.o. female. Her current pregnancy has completed [redacted]w[redacted]d (Estimated Date of Delivery: 08/22/20). Deborah Steele and her partner have a three year old son together.   Prenatal history was not reviewed in detail today. Upon review of records, Deborah Steele denied the use of alcohol, tobacco or street drugs. Her son was born via Cesarean section. She has a history of hypertension and preeclampsia. Her medical and surgical histories are otherwise noncontributory.  Family History  A three generation pedigree was not drafted today. However, both family histories were briefly reviewed and found to be noncontributory for birth defects, intellectual disability, recurrent pregnancy loss, and known genetic conditions.    Discussion  Increased MS-AFP:  I saw Deborah Steele for genetic counseling today at the request of Dr. Judeth Cornfield due to her MS-AFP screening results that were high risk for an open neural tube defect (ONTD). Results of the screen indicated that the current pregnancy has a 1 in 69 (1.4%) risk to be affected with an ONTD such as spina bifida.   We reviewed these MS-AFP results in detail. We discussed that there are many explanations for an elevated AFP result, including twin pregnancies, dating errors, ONTDs such as anencephaly or spina bifida, abdominal wall defects, placental abnormalities, fetal growth restriction, and adverse obstetrical outcomes (oligohydramnios, placental abruption, intrauterine fetal  demise/stillbirth, preterm delivery, premature rupture of membranes, or preeclampsia), or a normal variant.    Deborah Steele had her anatomy ultrasound performed prior to her genetic counseling appointment. The ultrasound report will be sent under separate cover. Fetal growth restriction and echogenic bowel were identified on today's ultrasound. Anatomy ultrasound did not detect twins, ONTDs, abdominal wall defects, or oligohydramnios.   Echogenic bowel & fetal growth restriction:  We reviewed that echogenic bowel refers to an area in the fetal intestines that appears brighter, or denser, than the liver and/or bone on ultrasound. Echogenic bowel is detected in approximately 1% of fetuses at the time of a second trimester ultrasound evaluation. Many cases of isolated echogenic bowel resolve over time and have normal outcomes. However, other possible causes for echogenic bowel include fetal blood ingestion, fetal aneuploidy, cystic fibrosis, fetal growth restriction, prenatal infection, and a fetal gastrointestinal malformation or obstruction.  We reviewed that chromosomal aneuploidies account for 4-25% of all cases of echogenic bowel. Aneuploidies associated with echogenic bowel include trisomy 21 (Down syndrome), trisomy 50, and trisomy 54. We briefly reviewed features of these conditions.  We also discussed that viral infections can be associated with echogenic bowel. Some of these infections include cytomegalovirus (CMV) and toxoplasmosis. Symptoms of congenital infections vary and many women are unaware that they have been exposed to one of these viral infections prenatally. Maternal infections contracted during pregnancy can sometimes be vertically transmitted to a fetus. If a fetus becomes affected, they may demonstrate ultrasound findings such as echogenic bowel. Other ultrasound findings associated with congenital CMV infections may include ascites, hydrops, microcephaly, intracranial and intrahepatic  densities. Other ultrasound findings associated with fetal toxoplasmosis may include cerebral ventricular dilatation, intracranial and intrahepatic densities, ascites,  and microcephaly.  Finally, we discussed that echogenic bowel can be seen in conjunction with other malformations or perinatal complications. Other fetal anomalies that are occasionally seen in conjunction with echogenic bowel include congenital heart defects, hydrocephalus, osteochondrodysplasia, and urinary tract malformations.  Approximately 5-20% of fetuses with echogenic bowel are growth restricted during pregnancy. We also discussed that echogenic bowel can be associated with intrauterine fetal demise.   Fetal growth restriction:  We discussed that fetal growth restriction can also have several causes. Like echogenic bowel, fetal growth restriction can be seen in pregnancies affected by chromosomal aneuploidies and fetuses with congenital CMV or toxoplasmosis infections. Fetal growth restriction can also be associated with placental insufficiency, umbilical cord abnormalities, and other pregnancy complications. Given the increased MS-AFP result, it is possible that placental insufficiency is the cause of the growth restriction noted on ultrasound, as increased MS-AFP is not routinely associated with chromosomal aneuploidies or prenatal infections. However, without further testing, fetal aneuploidy or prenatal infections cannot be ruled out.  Testing options:  We reviewed available screening/testing options to attempt to determine the etiology of the fetus's echogenic bowel and growth restriction. Firstly, we could pursue noninvasive prenatal screening (NIPS) for chromosomal aneuploidies. Specifically, we discussed that NIPS analyzes cell free DNA originating from the placenta that is found in the maternal blood circulation during pregnancy. This test is not diagnostic for chromosome conditions, but can provide information regarding the  presence or absence of extra fetal DNA for chromosomes 13, 18, 21, and the sex chromosomes. Thus, it would not identify or rule out all fetal aneuploidy. The reported detection rate is 91-99% for trisomies 21, 18, 13, and sex chromosome aneuploidies. The false positive rate is reported to be less than 0.1% for any of these conditions.   We also discussed the option of performing a blood test for common infections like cytomegalovirus and toxoplasmosis, both of which can be associated with echogenic bowel. If it is determined that Deborah Steele has had a recent infection (with elevated IgM antibodies), amniotic fluid obtained from an amniocentesis could also be tested to learn if a maternal infection has reached the fetus.  Finally, Deborah Steele was counseled regarding diagnostic testing via amniocentesis. We discussed the technical aspects of the procedure and quoted up to a 1 in 500 (0.2%) risk for spontaneous pregnancy loss or other adverse pregnancy outcomes as a result of amniocentesis. Cultured cells from an amniocentesis sample allow for the visualization of a fetal karyotype, which can detect >99% of large chromosomal aberrations. Chromosomal microarray can also be performed to identify smaller deletions or duplications of fetal chromosomal material. Amniocentesis could also be performed to assess whether the fetus is affected by prenatal infections such as CMV or toxoplasmosis or ONTDs via AF-AFP and acetylcholinesterase analysis. After careful consideration, Deborah Steele declined amniocentesis at this time. She understands that amniocentesis is available at any point after 16 weeks of pregnancy and that she may opt to undergo the procedure at a later date should she change her mind.  Plan:  Deborah Steele is interested in pursuing NIPS and CMV/toxoplasmosis screening. However, she had concerns about cost. She requested that I perform a benefits investigation to estimate the out of pocket cost for NIPS  before pursuing testing. We discussed that the laboratory [facility] offers NIPS for $99 out of pocket if pursuing testing through insurance is expected to cost more than that. [facility] does not offer testing for CMV or toxoplasmosis, so I will attempt to investigate the cost of this  testing through [facility]. I will contact Deborah Steele once the benefits investigation is complete and facilitate sample collection and testing from there if still desired. She was agreeable with this plan.  I counseled Deborah Steele regarding the above risks and available options. The approximate face-to-face time with the genetic counselor was 40 minutes.  In summary:  Discussed increased MS-AFP result ? Elevated MS-AFP (MoM3.33,1 in69risk for open neural tube defects) ? Discussed for receiving this type of result, includingONTDs, abdominal wall defects,fetal growth restriction,and adverse obstetrical outcomes  Reviewed results of ultrasound ? Echogenic bowel & fetal growth restriction noted  ? No other fetal anomalies or markers seen ? No evidence of open neural tube defect or abdominal wall defect  Discussed possible reasons for elevated AFP, fetal growth restriction, & echogenic bowel ? Fetal aneuploidy, prenatal infection (CMV, toxoplasmosis), placental insufficiency all possible  Offered additional testing and screening ? Potentially interested in NIPS and CMV/toxoplasmosis screening depending on cost. I will perform benefits investigation & gather info about cost for prenatal infection screening and facilitate sample collection if desired from there ? Declined amniocentesis  Briefly reviewed family history concerns   Gershon Crane, MS, Aeronautical engineer

## 2020-04-03 ENCOUNTER — Telehealth: Payer: Self-pay | Admitting: Genetic Counselor

## 2020-04-03 ENCOUNTER — Other Ambulatory Visit: Payer: Self-pay | Admitting: *Deleted

## 2020-04-03 DIAGNOSIS — IMO0002 Reserved for concepts with insufficient information to code with codable children: Secondary | ICD-10-CM

## 2020-04-03 DIAGNOSIS — O358XX0 Maternal care for other (suspected) fetal abnormality and damage, not applicable or unspecified: Secondary | ICD-10-CM

## 2020-04-03 NOTE — Telephone Encounter (Signed)
I called Ms. Sansoucie to discuss results from her benefits investigation. Per a billing representative from the laboratory [facility], noninvasive prenatal screening (NIPS) is estimated to cost $450 if testing is ordered through Ms. Stennett's insurance. Her insurance covers a portion of the testing, but her deductible likely reset due to the new year. If she chose to pursue testing through insurance, this cost would go towards her deductible. I reminded Ms. Lythgoe that NIPS is also available through [facility] for a patient-pay price of $99. If she opted for this, the $99 would not go toward her insurance deductible.  I also informed Ms. Suell that it does not look like her insurance requires prior authorization for cytomegalovirus/toxoplasmosis infection testing, so they likely will cover a portion of the testing. These tests cost a couple hundred dollars without insurance, so the cost for this testing would likely be less than that after insurance is applied.  Ms. Durfee inquired if her dating could potentially be contributing to the fetal growth restriction noted on ultrasound. She remembered that her dates were off by approximately one week when she had her first trimester ultrasound performed. We discussed that if her LMP were used, her due date would be 08/21/20 or 08/22/20. If results from the ultrasound were used, her due date would be 08/26/20. Per Dr. Judeth Cornfield, if her LMP is used, the estimated fetal weight yesterday would be at the 2nd percentile. If her ultrasound is used, the estimated fetal weight would be at the 9th percentile, so the fetus would still be measuring small. Since her dates by LMP and ultrasound were not off by >5 days, her due date was not changed based on yesterday's ultrasound (per ACOG guidelines).  Ms. Brunker requested to call me back later this afternoon to let me know if she would still like to pursue NIPS/prenatal infection testing. I will wait to hear from her and coordinate  sample collection from there if desired.  Gershon Crane, MS, [facility] Genetic Counselor

## 2020-04-12 ENCOUNTER — Other Ambulatory Visit: Payer: BC Managed Care – PPO

## 2020-04-16 ENCOUNTER — Ambulatory Visit (INDEPENDENT_AMBULATORY_CARE_PROVIDER_SITE_OTHER): Payer: BC Managed Care – PPO

## 2020-04-16 ENCOUNTER — Encounter: Payer: Self-pay | Admitting: Obstetrics and Gynecology

## 2020-04-16 ENCOUNTER — Other Ambulatory Visit: Payer: Self-pay

## 2020-04-16 ENCOUNTER — Ambulatory Visit (INDEPENDENT_AMBULATORY_CARE_PROVIDER_SITE_OTHER): Payer: BC Managed Care – PPO | Admitting: Obstetrics and Gynecology

## 2020-04-16 VITALS — BP 138/82 | HR 80 | Wt 173.0 lb

## 2020-04-16 DIAGNOSIS — O0992 Supervision of high risk pregnancy, unspecified, second trimester: Secondary | ICD-10-CM | POA: Diagnosis not present

## 2020-04-16 DIAGNOSIS — Z3A21 21 weeks gestation of pregnancy: Secondary | ICD-10-CM | POA: Diagnosis not present

## 2020-04-16 DIAGNOSIS — I10 Essential (primary) hypertension: Secondary | ICD-10-CM | POA: Diagnosis not present

## 2020-04-16 DIAGNOSIS — O280 Abnormal hematological finding on antenatal screening of mother: Secondary | ICD-10-CM | POA: Diagnosis not present

## 2020-04-16 DIAGNOSIS — O368321 Maternal care for abnormalities of the fetal heart rate or rhythm, second trimester, fetus 1: Secondary | ICD-10-CM

## 2020-04-16 DIAGNOSIS — O34219 Maternal care for unspecified type scar from previous cesarean delivery: Secondary | ICD-10-CM | POA: Diagnosis not present

## 2020-04-16 DIAGNOSIS — O368390 Maternal care for abnormalities of the fetal heart rate or rhythm, unspecified trimester, not applicable or unspecified: Secondary | ICD-10-CM

## 2020-04-16 DIAGNOSIS — O364XX0 Maternal care for intrauterine death, not applicable or unspecified: Secondary | ICD-10-CM

## 2020-04-16 NOTE — Progress Notes (Signed)
   PRENATAL VISIT NOTE  Subjective:  Deborah Steele is a 25 y.o. G2P1001 at [redacted]w[redacted]d being seen today for ongoing prenatal care.  She is currently monitored for the following issues for this high-risk pregnancy and has Needle stick, hypodermic, accidental; History of gestational hypertension; Hypertension; Supervision of high-risk pregnancy; Abnormal antenatal AFP screen; and Previous cesarean delivery affecting pregnancy on their problem list.  Patient reports no complaints.  Contractions: Not present. Vag. Bleeding: None.  Movement: Present. Denies leaking of fluid.   The following portions of the patient's history were reviewed and updated as appropriate: allergies, current medications, past family history, past medical history, past social history, past surgical history and problem list.   Objective:   Vitals:   04/16/20 1432  BP: 138/82  Pulse: 80  Weight: 173 lb (78.5 kg)    Fetal Status:     Movement: Present     General:  Alert, oriented and cooperative. Patient is in no acute distress.  Skin: Skin is warm and dry. No rash noted.   Cardiovascular: Normal heart rate noted  Respiratory: Normal respiratory effort, no problems with respiration noted  Abdomen: Soft, gravid, appropriate for gestational age.  Pain/Pressure: Absent     Pelvic: Cervical exam deferred        Extremities: Normal range of motion.  Edema: None  Mental Status: Normal mood and affect. Normal behavior. Normal judgment and thought content.   Assessment and Plan:  Pregnancy: G2P1001 at [redacted]w[redacted]d 1. Supervision of high risk pregnancy in second trimester Patient is doing well. She reports no fetal movement over the past few days. She denies leakage of fluid, vaginal bleeding or contraction pain. Pt informed that the ultrasound is considered a limited OB ultrasound and is not intended to be a complete ultrasound exam.  Patient also informed that the ultrasound is not being completed with the intent of assessing for fetal  or placental anomalies or any pelvic abnormalities.  Explained that the purpose of today's ultrasound is to assess for  viability.  Patient acknowledges the purpose of the exam and the limitations of the study.   Reviewed ultrasound findings with patient which demonstrated anhydramnios and no fetal cardiac activity. Emotional support was provided. Discussed delivery plan by IOL. Patient will contact the office to let us know when she plans on coming to the hospital for her IOL as she needs to arrange child care. Answered questions regarding IOL    Preterm labor symptoms and general obstetric precautions including but not limited to vaginal bleeding, contractions, leaking of fluid and fetal movement were reviewed in detail with the patient. Please refer to After Visit Summary for other counseling recommendations.   No follow-ups on file.  Future Appointments  Date Time Provider Department Center  04/23/2020  2:45 PM [facility] NURSE [facility]  04/23/2020  3:00 PM [facility] US1 [facility]  05/28/2020  1:30 PM [facility] [place] 1 [facility]  05/28/2020  1:35 PM [facility] [place] 1 [facility]    Catalina Antigua, MD

## 2020-04-16 NOTE — Progress Notes (Signed)
+   Fetal movement. No complaints.  

## 2020-04-17 ENCOUNTER — Inpatient Hospital Stay (HOSPITAL_COMMUNITY)
Admission: AD | Admit: 2020-04-17 | Discharge: 2020-04-18 | DRG: 806 | Disposition: A | Discharge status: Home / Self Care | Payer: BC Managed Care – PPO | Attending: Family Medicine | Admitting: Family Medicine

## 2020-04-17 ENCOUNTER — Other Ambulatory Visit: Payer: Self-pay

## 2020-04-17 ENCOUNTER — Encounter (HOSPITAL_COMMUNITY): Payer: Self-pay | Admitting: Family Medicine

## 2020-04-17 DIAGNOSIS — I10 Essential (primary) hypertension: Secondary | ICD-10-CM | POA: Diagnosis present

## 2020-04-17 DIAGNOSIS — O280 Abnormal hematological finding on antenatal screening of mother: Secondary | ICD-10-CM | POA: Diagnosis present

## 2020-04-17 DIAGNOSIS — O364XX0 Maternal care for intrauterine death, not applicable or unspecified: Principal | ICD-10-CM | POA: Diagnosis present

## 2020-04-17 DIAGNOSIS — Z3A21 21 weeks gestation of pregnancy: Secondary | ICD-10-CM

## 2020-04-17 DIAGNOSIS — O1002 Pre-existing essential hypertension complicating childbirth: Secondary | ICD-10-CM | POA: Diagnosis present

## 2020-04-17 DIAGNOSIS — O10919 Unspecified pre-existing hypertension complicating pregnancy, unspecified trimester: Secondary | ICD-10-CM | POA: Diagnosis present

## 2020-04-17 DIAGNOSIS — Z3A22 22 weeks gestation of pregnancy: Secondary | ICD-10-CM | POA: Diagnosis not present

## 2020-04-17 DIAGNOSIS — O34219 Maternal care for unspecified type scar from previous cesarean delivery: Secondary | ICD-10-CM | POA: Diagnosis present

## 2020-04-17 LAB — CBC
HCT: 41.9 % (ref 36.0–46.0)
HCT: 38.1 % (ref 36.0–46.0)
Hemoglobin: 14 g/dL (ref 12.0–15.0)
Hemoglobin: 13 g/dL (ref 12.0–15.0)
MCH: 29.2 pg (ref 26.0–34.0)
MCH: 29.4 pg (ref 26.0–34.0)
MCHC: 33.4 g/dL (ref 30.0–36.0)
MCHC: 34.1 g/dL (ref 30.0–36.0)
MCV: 87.3 fL (ref 80.0–100.0)
MCV: 86.2 fL (ref 80.0–100.0)
Platelets: 275 10*3/uL (ref 150–400)
Platelets: 233 10*3/uL (ref 150–400)
RBC: 4.8 MIL/uL (ref 3.87–5.11)
RBC: 4.42 MIL/uL (ref 3.87–5.11)
RDW: 12.9 % (ref 11.5–15.5)
RDW: 12.5 % (ref 11.5–15.5)
WBC: 8.9 10*3/uL (ref 4.0–10.5)
WBC: 13.8 10*3/uL — ABNORMAL HIGH (ref 4.0–10.5)
nRBC: 0 % (ref 0.0–0.2)
nRBC: 0 % (ref 0.0–0.2)

## 2020-04-17 LAB — COMPREHENSIVE METABOLIC PANEL
ALT: 34 U/L (ref 0–44)
AST: 30 U/L (ref 15–41)
Albumin: 3.1 g/dL — ABNORMAL LOW (ref 3.5–5.0)
Alkaline Phosphatase: 75 U/L (ref 38–126)
Anion gap: 10 (ref 5–15)
BUN: 8 mg/dL (ref 6–20)
CO2: 20 mmol/L — ABNORMAL LOW (ref 22–32)
Calcium: 9 mg/dL (ref 8.9–10.3)
Chloride: 102 mmol/L (ref 98–111)
Creatinine, Ser: 0.72 mg/dL (ref 0.44–1.00)
GFR, Estimated: >60 mL/min (ref >60)
Glucose, Bld: 90 mg/dL (ref 70–99)
Potassium: 3.7 mmol/L (ref 3.5–5.1)
Sodium: 132 mmol/L — ABNORMAL LOW (ref 135–145)
Total Bilirubin: 0.3 mg/dL (ref 0.3–1.2)
Total Protein: 6.5 g/dL (ref 6.5–8.1)

## 2020-04-17 LAB — PROTEIN / CREATININE RATIO, URINE
Creatinine, Urine: 132.76 mg/dL
Protein Creatinine Ratio: 0.08 mg/mg{Cre} (ref 0.00–0.15)
Total Protein, Urine: 10 mg/dL

## 2020-04-17 LAB — TYPE AND SCREEN
ABO/RH(D): B POS
Antibody Screen: NEGATIVE

## 2020-04-17 MED ORDER — EPHEDRINE 5 MG/ML INJ: 10.0000 mg | INTRAVENOUS | Status: DC | PRN

## 2020-04-17 MED ORDER — LIDOCAINE HCL (PF) 1 % IJ SOLN
30.0000 mL | INTRAMUSCULAR | Status: DC | PRN
Start: 2020-04-17 — End: 2020-04-18

## 2020-04-17 MED ORDER — LACTATED RINGERS IV SOLN: 500.0000 mL | Freq: Once | INTRAVENOUS | Status: DC

## 2020-04-17 MED ORDER — ACETAMINOPHEN 325 MG PO TABS: 650.0000 mg | ORAL_TABLET | ORAL | Status: DC | PRN

## 2020-04-17 MED ORDER — OXYCODONE-ACETAMINOPHEN 5-325 MG PO TABS: 2.0000 | ORAL_TABLET | ORAL | Status: DC | PRN

## 2020-04-17 MED ORDER — ONDANSETRON HCL 4 MG/2ML IJ SOLN: 4.0000 mg | Freq: Four times a day (QID) | INTRAMUSCULAR | Status: DC | PRN

## 2020-04-17 MED ORDER — LACTATED RINGERS IV SOLN: INTRAVENOUS | Status: DC

## 2020-04-17 MED ORDER — SOD CITRATE-CITRIC ACID 500-334 MG/5ML PO SOLN: 30.0000 mL | ORAL | Status: DC | PRN

## 2020-04-17 MED ORDER — FENTANYL CITRATE (PF) 100 MCG/2ML IJ SOLN
100.0000 ug | INTRAMUSCULAR | Status: DC | PRN
  Administered 2020-04-17 – 2020-04-18 (×4): 100 ug via INTRAVENOUS
  Filled 2020-04-17 (×4): qty 2

## 2020-04-17 MED ORDER — FENTANYL-BUPIVACAINE-NACL 0.5-0.125-0.9 MG/250ML-% EP SOLN
12.0000 mL/h | EPIDURAL | Status: DC | PRN
Start: 2020-04-17 — End: 2020-04-18
  Administered 2020-04-18: 12 mL/h via EPIDURAL
  Filled 2020-04-17: qty 250

## 2020-04-17 MED ORDER — LACTATED RINGERS IV SOLN: 500.0000 mL | INTRAVENOUS | Status: DC | PRN

## 2020-04-17 MED ORDER — OXYCODONE-ACETAMINOPHEN 5-325 MG PO TABS: 1.0000 | ORAL_TABLET | ORAL | Status: DC | PRN

## 2020-04-17 MED ORDER — OXYTOCIN-SODIUM CHLORIDE 30-0.9 UT/500ML-% IV SOLN
2.5000 [IU]/h | INTRAVENOUS | Status: DC
  Administered 2020-04-18: 2.5 [IU]/h via INTRAVENOUS
  Filled 2020-04-17: qty 500

## 2020-04-17 MED ORDER — OXYTOCIN BOLUS FROM INFUSION
333.0000 mL | Freq: Once | INTRAVENOUS | Status: AC
  Administered 2020-04-18: 333 mL via INTRAVENOUS

## 2020-04-17 MED ORDER — PHENYLEPHRINE 40 MCG/ML (10ML) SYRINGE FOR IV PUSH (FOR BLOOD PRESSURE SUPPORT): 80.0000 ug | PREFILLED_SYRINGE | INTRAVENOUS | Status: DC | PRN

## 2020-04-17 MED ORDER — MISOPROSTOL 200 MCG PO TABS
600.0000 ug | ORAL_TABLET | ORAL | Status: AC | PRN
  Administered 2020-04-17 (×3): 600 ug via VAGINAL
  Filled 2020-04-17 (×3): qty 3

## 2020-04-17 MED ORDER — DIPHENHYDRAMINE HCL 50 MG/ML IJ SOLN: 12.5000 mg | INTRAMUSCULAR | Status: DC | PRN

## 2020-04-17 NOTE — Progress Notes (Signed)
LABOR PROGRESS NOTE  Deborah Steele is a 25 y.o. G2P1001 at [redacted]w[redacted]d  admitted for induction for IUFD.   Subjective: Felt pressure about 45 minutes ago, resolved. Now feeling some intermittent cramping.   Objective: BP (!) 143/93 (BP Location: Left Arm)   Pulse 76   Temp 99.2 F (37.3 C) (Oral)   Resp 18   LMP 11/16/2019 (Approximate)  or  Vitals:   04/17/20 1216 04/17/20 1228 04/17/20 1342 04/17/20 1454  BP: (!) 147/95  (!) 146/91 (!) 143/93  Pulse: (!) 106  78 76  Resp:    18  Temp:  99.2 F (37.3 C)    TempSrc:  Oral      Dilation: 1 Effacement (%): 50 Station: -1 Exam by:: Stinson MD  Labs: Lab Results  Component Value Date   WBC 8.9 04/17/2020   HGB 14.0 04/17/2020   HCT 41.9 04/17/2020   MCV 87.3 04/17/2020   PLT 275 04/17/2020    Patient Active Problem List   Diagnosis Date Noted  . IUFD at 20 weeks or more of gestation 04/17/2020  . Previous cesarean delivery affecting pregnancy 04/02/2020  . Abnormal antenatal AFP screen 03/08/2020  . Supervision of high-risk pregnancy 01/31/2020  . Hypertension 06/27/2019  . History of gestational hypertension 05/02/2016    Assessment / Plan: 25 y.o. G2P1001 at [redacted]w[redacted]d here for IOL for IUFD. Given 2nd dose of Cytotec 600 mcg at cervical check. Analgesia per maternal request.    Marcy Siren, D.O. OB Fellow  04/17/2020, 4:54 PM

## 2020-04-17 NOTE — Progress Notes (Signed)
LABOR PROGRESS NOTE  Deborah Steele is a 25 y.o. G2P1001 at [redacted]w[redacted]d admitted for induction for IUFD.   Subjective: Pt reports increasing discomfort with contractions. She strongly desires to progress labor along "as quickly as possible" per her report. No headache or vision changes. Desires epidural at this time.  Objective: BP (!) 142/94 (BP Location: Left Arm)   Pulse 91   Temp 99.9 F (37.7 C) (Oral)   Resp 17   LMP 11/16/2019 (Approximate)   SpO2 99%  or  Vitals:   04/17/20 1810 04/17/20 1941 04/17/20 2022 04/17/20 2050  BP: (!) 154/103 (!) 157/102 (!) 142/94   Pulse: 86 78 91   Resp: 17     Temp: 97.7 F (36.5 C) 99.9 F (37.7 C)    TempSrc: Axillary Oral    SpO2:    99%    Dilation: 1 Effacement (%): 80 Station: -1 Exam by: Barb Merino MD  Labs: Lab Results  Component Value Date   WBC 13.8 (H) 04/17/2020   HGB 13.0 04/17/2020   HCT 38.1 04/17/2020   MCV 86.2 04/17/2020   PLT 233 04/17/2020    Patient Active Problem List   Diagnosis Date Noted  . IUFD at 20 weeks or more of gestation 04/17/2020  . Previous cesarean delivery affecting pregnancy 04/02/2020  . Abnormal antenatal AFP screen 03/08/2020  . Supervision of high-risk pregnancy 01/31/2020  . Hypertension 06/27/2019  . History of gestational hypertension 05/02/2016    Assessment / Plan: 25 y.o. G2P1001 at [redacted]w[redacted]d here for IOL for IUFD.   #Labor  H/o Prior CS: S/p vaginal cytotec x3 (last dose at 2142). FB placement at 2230. Will plan to transition to low dose pitocin at next cervical check 4 hours from last cytotec dose. #Elevated BPs: Multiple mild range blood pressures intrapartum. Will obtain preeclampsia labs and continue to monitor. No concerning symptoms at this time. #Pain: IV fentanyl; plan for epidural at this time  Sheila Oats, MD OB Fellow, Faculty Practice 04/17/2020 11:37 PM

## 2020-04-17 NOTE — H&P (Signed)
Faculty Practice H&P  Deborah Steele is a 25 y.o. female G2P1001 with IUP at [redacted]w[redacted]d presenting for IOL due to fetal demise. Pregnancy complicated by h/o preeclampsia, current chronic hypertension not on medication, prior cesarean delivery. She had an ultrasound on 04/02/20, which showed FGR with baby measuring 1.3% with placental insufficiency. At her routine prenatal appointment yesterday, there wasn't any heart tones detected. An [place] confirmed IUFD.     Prenatal Course Source of Care: [facility] with onset of care at 14 weeks  Pregnancy complications or risks: Patient Active Problem List   Diagnosis Date Noted  . IUFD at 20 weeks or more of gestation 04/17/2020  . Previous cesarean delivery affecting pregnancy 04/02/2020  . Abnormal antenatal AFP screen 03/08/2020  . Supervision of high-risk pregnancy 01/31/2020  . Hypertension 06/27/2019  . History of gestational hypertension 05/02/2016    Prenatal labs and studies: ABO, Rh: --/--/PENDING (01/25 1212) Antibody: PENDING (01/25 1212) Rubella: 14.50 (12/01 1555) RPR: Non Reactive (12/01 1555)  HBsAg: Negative (12/01 1555)  HIV: Non Reactive (12/01 1555)  GBS:    2hr Glucola: n/a Genetic screening: abnormal AFP Anatomy US: abnormal with FGR  Past Medical History:  Past Medical History:  Diagnosis Date  . Headache   . Influenza 02/2016  . Medical history non-contributory     Past Surgical History:  Past Surgical History:  Procedure Laterality Date  . CESAREAN SECTION N/A 05/04/2016   Procedure: CESAREAN SECTION;  Surgeon: Lesly Dukes, MD;  Location: [facility] BIRTHING SUITES;  Service: Obstetrics;  Laterality: N/A;    Obstetrical History:  OB History    Gravida  2   Para  1   Term  1   Preterm  0   AB  0   Living  1     SAB  0   IAB  0   Ectopic  0   Multiple  0   Live Births  1           Gynecological History:  OB History    Gravida  2   Para  1   Term  1   Preterm  0   AB  0   Living  1      SAB  0   IAB  0   Ectopic  0   Multiple  0   Live Births  1           Social History:  Social History   Socioeconomic History  . Marital status: Married    Spouse name: Not on file  . Number of children: 1  . Years of education: Not on file  . Highest education level: Not on file  Occupational History  . Occupation: [employer]  Tobacco Use  . Smoking status: Never Smoker  . Smokeless tobacco: Never Used  Vaping Use  . Vaping Use: Never used  Substance and Sexual Activity  . Alcohol use: No  . Drug use: No  . Sexual activity: Yes    Birth control/protection: None  Other Topics Concern  . Not on file  Social History Narrative  . Not on file   Social Determinants of Health   Financial Resource Strain: Not on file  Food Insecurity: Not on file  Transportation Needs: Not on file  Physical Activity: Not on file  Stress: Not on file  Social Connections: Not on file    Family History:  Family History  Problem Relation Age of Onset  . Hyperlipidemia Mother   .  Hypertension Mother   . Asthma Other     Medications:  Prenatal vitamins,  Current Facility-Administered Medications  Medication Dose Route Frequency Provider Last Rate Last Admin  . acetaminophen (TYLENOL) tablet 650 mg  650 mg Oral Q4H PRN Arabella Merles, CNM      . fentaNYL (SUBLIMAZE) injection 100 mcg  100 mcg Intravenous Q1H PRN Arabella Merles, CNM      . lactated ringers infusion 500-1,000 mL  500-1,000 mL Intravenous PRN Arabella Merles, CNM      . lactated ringers infusion   Intravenous Continuous Arabella Merles, CNM 125 mL/hr at 04/17/20 1226 New Bag at 04/17/20 1226  . lidocaine (PF) (XYLOCAINE) 1 % injection 30 mL  30 mL Subcutaneous PRN Arabella Merles, CNM      . misoprostol (CYTOTEC) tablet 600 mcg  600 mcg Vaginal Q4H PRN Arabella Merles, CNM   600 mcg at 04/17/20 1240  . ondansetron (ZOFRAN) injection 4 mg  4 mg Intravenous Q6H PRN Arabella Merles, CNM       . oxyCODONE-acetaminophen (PERCOCET/ROXICET) 5-325 MG per tablet 1 tablet  1 tablet Oral Q4H PRN Arabella Merles, CNM      . oxyCODONE-acetaminophen (PERCOCET/ROXICET) 5-325 MG per tablet 2 tablet  2 tablet Oral Q4H PRN Arabella Merles, CNM      . oxytocin (PITOCIN) IV BOLUS FROM BAG  333 mL Intravenous Once Arabella Merles, CNM      . oxytocin (PITOCIN) IV infusion 30 units in NS 500 mL - Premix  2.5 Units/hr Intravenous Continuous Arabella Merles, CNM      . sodium citrate-citric acid (ORACIT) solution 30 mL  30 mL Oral Q2H PRN Arabella Merles, CNM        Allergies: No Known Allergies  Review of Systems: - negative  Physical Exam: Temperature 99.2 F (37.3 C), temperature source Oral, last menstrual period 11/16/2019. GENERAL: Well-developed, well-nourished female in no acute distress.  LUNGS: Clear to auscultation bilaterally.  HEART: Regular rate and rhythm. ABDOMEN: Soft, nontender, nondistended, gravid. EXTREMITIES: Nontender, no edema, 2+ distal pulses. Cervical Exam: Dilatation closed, Effacement thick, Station high     Pertinent Labs/Studies:   Lab Results  Component Value Date   WBC 9.2 02/22/2020   HGB 12.0 02/22/2020   HCT 34.7 02/22/2020   MCV 85 02/22/2020   PLT 322 02/22/2020    Assessment : Deborah Steele is a 25 y.o. G2P1001 at [redacted]w[redacted]d being admitted for IOL due to IUFD.  Plan: [place] confirmed no heartbeat Cytotec every 4 hours. Discussed analgesia - IV pain meds, PCA, vs epidural.   Levie Heritage, DO 04/17/2020, 12:58 PM

## 2020-04-18 ENCOUNTER — Encounter (HOSPITAL_COMMUNITY): Payer: Self-pay | Admitting: Family Medicine

## 2020-04-18 ENCOUNTER — Inpatient Hospital Stay (HOSPITAL_COMMUNITY): Payer: BC Managed Care – PPO | Admitting: Anesthesiology

## 2020-04-18 DIAGNOSIS — O364XX0 Maternal care for intrauterine death, not applicable or unspecified: Secondary | ICD-10-CM

## 2020-04-18 DIAGNOSIS — Z3A22 22 weeks gestation of pregnancy: Secondary | ICD-10-CM

## 2020-04-18 LAB — SAVE SMEAR(SSMR), FOR PROVIDER SLIDE REVIEW

## 2020-04-18 LAB — CBC
HCT: 34 % — ABNORMAL LOW (ref 36.0–46.0)
Hemoglobin: 11.6 g/dL — ABNORMAL LOW (ref 12.0–15.0)
MCH: 29.4 pg (ref 26.0–34.0)
MCHC: 34.1 g/dL (ref 30.0–36.0)
MCV: 86.3 fL (ref 80.0–100.0)
Platelets: 219 10*3/uL (ref 150–400)
RBC: 3.94 MIL/uL (ref 3.87–5.11)
RDW: 12.6 % (ref 11.5–15.5)
WBC: 17.6 10*3/uL — ABNORMAL HIGH (ref 4.0–10.5)
nRBC: 0 % (ref 0.0–0.2)

## 2020-04-18 LAB — PROTIME-INR
INR: 1 (ref 0.8–1.2)
Prothrombin Time: 12.4 seconds (ref 11.4–15.2)

## 2020-04-18 LAB — APTT: aPTT: 28 seconds (ref 24–36)

## 2020-04-18 LAB — RPR: RPR Ser Ql: NONREACTIVE

## 2020-04-18 LAB — FIBRINOGEN: Fibrinogen: 445 mg/dL (ref 210–475)

## 2020-04-18 MED ORDER — ACETAMINOPHEN 325 MG PO TABS
650.0000 mg | ORAL_TABLET | Freq: Four times a day (QID) | ORAL | Status: DC
  Administered 2020-04-18: 650 mg via ORAL
  Filled 2020-04-18: qty 2

## 2020-04-18 MED ORDER — CEFAZOLIN SODIUM-DEXTROSE 2-4 GM/100ML-% IV SOLN
2.0000 g | Freq: Once | INTRAVENOUS | Status: AC
  Administered 2020-04-18: 2 g via INTRAVENOUS
  Filled 2020-04-18: qty 100

## 2020-04-18 MED ORDER — PRENATAL MULTIVITAMIN CH: 1.0000 | ORAL_TABLET | Freq: Every day | ORAL | Status: DC

## 2020-04-18 MED ORDER — COCONUT OIL OIL: 1.0000 "application " | TOPICAL_OIL | Status: DC | PRN

## 2020-04-18 MED ORDER — TETANUS-DIPHTH-ACELL PERTUSSIS 5-2.5-18.5 LF-MCG/0.5 IM SUSY: 0.5000 mL | PREFILLED_SYRINGE | Freq: Once | INTRAMUSCULAR | Status: DC

## 2020-04-18 MED ORDER — IBUPROFEN 600 MG PO TABS: 600.0000 mg | ORAL_TABLET | Freq: Four times a day (QID) | ORAL | 0 refills | Status: DC

## 2020-04-18 MED ORDER — SENNOSIDES-DOCUSATE SODIUM 8.6-50 MG PO TABS: 2.0000 | ORAL_TABLET | Freq: Every evening | ORAL | 2 refills | Status: DC | PRN

## 2020-04-18 MED ORDER — DIBUCAINE (PERIANAL) 1 % EX OINT: 1.0000 "application " | TOPICAL_OINTMENT | CUTANEOUS | Status: DC | PRN

## 2020-04-18 MED ORDER — WITCH HAZEL-GLYCERIN EX PADS: 1.0000 "application " | MEDICATED_PAD | CUTANEOUS | Status: DC | PRN

## 2020-04-18 MED ORDER — IBUPROFEN 600 MG PO TABS
600.0000 mg | ORAL_TABLET | Freq: Four times a day (QID) | ORAL | Status: DC
  Administered 2020-04-18: 600 mg via ORAL
  Filled 2020-04-18: qty 1

## 2020-04-18 MED ORDER — LIDOCAINE HCL (PF) 1 % IJ SOLN
INTRAMUSCULAR | Status: DC | PRN
  Administered 2020-04-18 (×2): 4 mL via EPIDURAL

## 2020-04-18 MED ORDER — TRAMADOL HCL 50 MG PO TABS: 50.0000 mg | ORAL_TABLET | Freq: Four times a day (QID) | ORAL | 0 refills | Status: DC | PRN

## 2020-04-18 MED ORDER — ONDANSETRON HCL 4 MG PO TABS: 4.0000 mg | ORAL_TABLET | ORAL | Status: DC | PRN

## 2020-04-18 MED ORDER — BENZOCAINE-MENTHOL 20-0.5 % EX AERO: 1.0000 "application " | INHALATION_SPRAY | CUTANEOUS | Status: DC | PRN

## 2020-04-18 MED ORDER — SENNOSIDES-DOCUSATE SODIUM 8.6-50 MG PO TABS: 2.0000 | ORAL_TABLET | Freq: Every day | ORAL | Status: DC

## 2020-04-18 MED ORDER — DIPHENHYDRAMINE HCL 25 MG PO CAPS
25.0000 mg | ORAL_CAPSULE | Freq: Four times a day (QID) | ORAL | Status: DC | PRN
Start: 2020-04-18 — End: 2020-04-18

## 2020-04-18 MED ORDER — OXYTOCIN-SODIUM CHLORIDE 30-0.9 UT/500ML-% IV SOLN: 1.0000 m[IU]/min | INTRAVENOUS | Status: DC

## 2020-04-18 MED ORDER — CYCLOBENZAPRINE HCL 10 MG PO TABS: 10.0000 mg | ORAL_TABLET | Freq: Three times a day (TID) | ORAL | 2 refills | Status: DC | PRN

## 2020-04-18 MED ORDER — SIMETHICONE 80 MG PO CHEW: 80.0000 mg | CHEWABLE_TABLET | ORAL | Status: DC | PRN

## 2020-04-18 MED ORDER — ONDANSETRON HCL 4 MG/2ML IJ SOLN: 4.0000 mg | INTRAMUSCULAR | Status: DC | PRN

## 2020-04-18 NOTE — Anesthesia Procedure Notes (Signed)
Epidural Patient location during procedure: OB Start time: 04/18/2020 1:22 AM End time: 04/18/2020 1:30 AM  Staffing Anesthesiologist: Mal Amabile, MD Performed: anesthesiologist   Preanesthetic Checklist Completed: patient identified, IV checked, site marked, risks and benefits discussed, surgical consent, monitors and equipment checked, pre-op evaluation and timeout performed  Epidural Patient position: sitting Prep: DuraPrep and site prepped and draped Patient monitoring: continuous pulse ox and blood pressure Approach: midline Location: L3-L4 Injection technique: LOR air  Needle:  Needle type: Tuohy  Needle gauge: 17 G Needle length: 9 cm and 9 Needle insertion depth: 6 cm Catheter type: closed end flexible Catheter size: 19 Gauge Catheter at skin depth: 11 cm Test dose: negative and Other  Assessment Events: blood not aspirated, injection not painful, no injection resistance, no paresthesia and negative IV test  Additional Notes Patient identified. Risks and benefits discussed including failed block, incomplete  Pain control, post dural puncture headache, nerve damage, paralysis, blood pressure Changes, nausea, vomiting, reactions to medications-both toxic and allergic and post Partum back pain. All questions were answered. Patient expressed understanding and wished to proceed. Sterile technique was used throughout procedure. Epidural site was Dressed with sterile barrier dressing. No paresthesias, signs of intravascular injection Or signs of intrathecal spread were encountered.  Patient was more comfortable after the epidural was dosed. Please see RN's note for documentation of vital signs and FHR which are stable. Reason for block:procedure for pain

## 2020-04-18 NOTE — Anesthesia Preprocedure Evaluation (Addendum)
Anesthesia Evaluation  Patient identified by MRN, date of birth, ID band Patient awake    Reviewed: Allergy & Precautions, Patient's Chart, lab work & pertinent test results  Airway Mallampati: II  TM Distance: >3 FB Neck ROM: Full    Dental no notable dental hx.    Pulmonary neg pulmonary ROS,    Pulmonary exam normal breath sounds clear to auscultation       Cardiovascular hypertension, Normal cardiovascular exam Rhythm:Regular Rate:Normal     Neuro/Psych  Headaches, negative psych ROS   GI/Hepatic negative GI ROS, Neg liver ROS,   Endo/Other  Obesity  Renal/GU negative Renal ROS  negative genitourinary   Musculoskeletal   Abdominal (+) + obese,   Peds  Hematology negative hematology ROS (+)   Anesthesia Other Findings   Reproductive/Obstetrics (+) Pregnancy 22 weeks IUFD                            Anesthesia Physical Anesthesia Plan  ASA: II  Anesthesia Plan: Epidural   Post-op Pain Management:    Induction:   PONV Risk Score and Plan:   Airway Management Planned: Natural Airway  Additional Equipment:   Intra-op Plan:   Post-operative Plan:   Informed Consent: I have reviewed the patients History and Physical, chart, labs and discussed the procedure including the risks, benefits and alternatives for the proposed anesthesia with the patient or authorized representative who has indicated his/her understanding and acceptance.       Plan Discussed with: Anesthesiologist  Anesthesia Plan Comments: (Patient refuses Covid testing.  Given her IUFD will need a full set of coagulation studies prior to insertion of an epidural for pain relief.)        Anesthesia Quick Evaluation

## 2020-04-18 NOTE — Progress Notes (Signed)
   04/18/20 1010  Clinical Encounter Type  Visited With Patient and family together  Visit Type Spiritual support;Death  Referral From Nurse  Consult/Referral To Chaplain  Spiritual Encounters  Spiritual Needs Emotional;Grief support;Prayer  Stress Factors  Patient Stress Factors Loss  Family Stress Factors Loss  Chaplain responded to the page for Mrs. Diora and Family.  Mrs Rashena precious little one came to end of life. Chaplain offered support, comfort, and prayer.  Informed Mrs. Jae and her Husband if they need anything please let the Nurse or Chaplain know and we will help anyway we can.    A list of funeral homes were given to Mrs. Tiarrah and her Husband and the Nurse and I explained she will have to call and make the arrangements for the precious one to be picked up and they will give her further instructions.  Mrs. Yovanna and her Husband did not talk much and after I gave the prayer I explained I will leave you alone to have time with your precious one.  They was appreciative of the visit.  Called the Social Worker Cala Bradford and left a message stated Lunette Stands has visited with the Patient and Family.  Chaplain Arshdeep Bolger [place]  [phone]

## 2020-04-18 NOTE — Progress Notes (Signed)
CSW received consult due to IUFD.  CSW available for support secondary to Spiritual Care Services and will await call from Chaplain before becoming involved.  CSW screening out referral at this time.  Gibril Mastro, LCSW Clinical Social Worker [facility] Cell#: [phone]  

## 2020-04-18 NOTE — Progress Notes (Signed)
Discharge instructions and prescriptions given to pt. Discussed post vaginal delivery care, signs and symptoms to report to the MD, upcoming appointments, and meds. Pt verbalizes understanding and has no questions at this time. Pt decided on [employer] for funeral home for baby. Notified house coverage RN. Pt discharged home from hospital in stable condition.

## 2020-04-18 NOTE — Discharge Summary (Signed)
Postpartum Discharge Summary      Patient Name: Deborah Steele DOB: [birth year] MRN: [id number]  Date of admission: 04/17/2020 Delivery date:04/18/2020  Delivering provider: Randa Ngo  Date of discharge: 04/18/2020  Admitting diagnosis: IUFD at 86 weeks or more of gestation [O36.4XX0] Intrauterine pregnancy: [redacted]w[redacted]d    Secondary diagnosis:  Principal Problem:   Vaginal delivery Active Problems:   Chronic hypertension affecting pregnancy   Abnormal antenatal AFP screen   Previous cesarean delivery affecting pregnancy   IUFD at 20 weeks or more of gestation  Additional problems: as noted above   Discharge diagnosis: Vaginal delivery of IUFD                                            Post partum procedures:none Augmentation: AROM, Cytotec and IP Foley Complications: Cord Avulsion s/p Manual Extraction of Placenta  Hospital course: Induction of Labor With Vaginal Delivery   25y.o. yo G2P1001 at 260w0das admitted to the hospital 04/17/2020 for induction of labor.  Indication for induction: IUFD.  Patient had an uncomplicated labor course as follows: Membrane Rupture Time/Date: 2:08 AM ,04/18/2020   Delivery Method:Vaginal, Spontaneous  Episiotomy: None  Lacerations:  None  Details of delivery can be found in separate delivery note.  Patient had a routine postpartum course. Patient is discharged home 04/18/20 about 9 hours after delivery. Appropriate support given to her and her husband.   Newborn Data: Birth date:04/18/2020  Birth time:2:10 AM  Gender:Female  Living status:Fetal Demise  Apgars:0,0 Weight:434 g    Physical exam  Vitals:   04/18/20 0346 04/18/20 0400 04/18/20 0625 04/18/20 0818  BP:  119/68 112/71 123/83  Pulse:  71 71 63  Resp:  18 18   Temp: 99.1 F (37.3 C)  98.1 F (36.7 C) 98.1 F (36.7 C)  TempSrc: Oral  Oral Oral  SpO2:   99% 100%   General: alert, cooperative and no distress Lochia: appropriate Uterine Fundus: firm Incision: N/A DVT  Evaluation: No evidence of DVT seen on physical exam. No cords or calf tenderness. No significant calf/ankle edema. Labs: Lab Results  Component Value Date   WBC 17.6 (H) 04/18/2020   HGB 11.6 (L) 04/18/2020   HCT 34.0 (L) 04/18/2020   MCV 86.3 04/18/2020   PLT 219 04/18/2020   CMP Latest Ref Rng & Units 04/17/2020  Glucose 70 - 99 mg/dL 90  BUN 6 - 20 mg/dL 8  Creatinine 0.44 - 1.00 mg/dL 0.72  Sodium 135 - 145 mmol/L 132(L)  Potassium 3.5 - 5.1 mmol/L 3.7  Chloride 98 - 111 mmol/L 102  CO2 22 - 32 mmol/L 20(L)  Calcium 8.9 - 10.3 mg/dL 9.0  Total Protein 6.5 - 8.1 g/dL 6.5  Total Bilirubin 0.3 - 1.2 mg/dL 0.3  Alkaline Phos 38 - 126 U/L 75  AST 15 - 41 U/L 30  ALT 0 - 44 U/L 34   Edinburgh Score: No flowsheet data found.   After visit meds:  Allergies as of 04/18/2020   No Known Allergies     Medication List    TAKE these medications   acetaminophen 500 MG tablet Commonly known as: TYLENOL Take 500 mg by mouth every 6 (six) hours as needed.   aspirin 81 MG chewable tablet Chew 1 tablet (81 mg total) by mouth daily.   Blood Pressure Kit Devi 1 kit by  Does not apply route once a week. Check Blood Pressure regularly and record readings into the [employer] App.  Large Cuff.  DX O90.0   cyclobenzaprine 10 MG tablet Commonly known as: FLEXERIL Take 1 tablet (10 mg total) by mouth 3 (three) times daily as needed for muscle spasms.   ibuprofen 600 MG tablet Commonly known as: ADVIL Take 1 tablet (600 mg total) by mouth every 6 (six) hours.   senna-docusate 8.6-50 MG tablet Commonly known as: Senokot-S Take 2 tablets by mouth at bedtime as needed for mild constipation or moderate constipation.   traMADol 50 MG tablet Commonly known as: ULTRAM Take 1 tablet (50 mg total) by mouth every 6 (six) hours as needed for severe pain.        Discharge home in stable condition Infant [place] Discharge instruction: per After Visit Summary and Postpartum  booklet. Activity: Advance as tolerated. Pelvic rest for 6 weeks.  Diet: routine diet Future Appointments: Future Appointments  Date Time Provider [place]  04/23/2020  2:45 PM [facility] NURSE [facility]  04/23/2020  3:00 PM [facility] US1 [facility]  04/24/2020  2:45 PM Lynnea Ferrier, LCSW [facility] None  05/28/2020  1:30 PM [facility] [place] 1 [facility]  05/28/2020  1:35 PM [facility] [place] 1 [facility]  05/30/2020  2:00 PM [place], Gildardo Cranker, MD [facility] None   Follow up Visit: Message sent to [facility] to schedule f/u postpartum appt.  Please schedule this patient for a In person postpartum visit in 6 weeks with the following provider: MD. Additional Postpartum F/U:Postpartum Depression checkup 1 week High risk pregnancy complicated by: cHTN, h/o CS x1, IUGR, Abnormal antenatal AFP, h/o Preeclampsia Delivery mode:  Vaginal, Spontaneous  Anticipated Birth Control:  Unsure    04/18/2020 Verita Schneiders, MD

## 2020-04-18 NOTE — Discharge Instructions (Signed)
Vaginal Delivery, Care After This sheet gives you information about how to care for yourself after your delivery. Your health care provider may also give you more specific instructions. If you have problems or questions, contact your health care provider. What can I expect after the procedure? After delivery, it is common to have:  Soreness in your abdomen, your vagina, and the area between the opening of your vagina and your anus (perineum).  Tiredness (fatigue).  Cramps.  Breast tenderness related to engorgement.  Some bleeding and discharge from your vagina. This may continue for about 6 weeks. The bleeding and discharge will start out red, then become pink, then yellow, and finally white.  Tenderness in your vagina or perineum if you had an episiotomy or a vaginal tear. This may last several weeks.  Emotions that change quickly. Common emotions include: ? Sadness. ? Anger. ? Denial. ? Guilt. ? Depression. Follow these instructions at home: Vaginal and perineal care  Keep your perineum clean and dry as told by your health care provider.  Wipe from front to back when you use the toilet.  If you have an episiotomy or a vaginal tear, check for signs of infection, such as: ? Increasing redness, swelling, or pain in your perineal area. ? Pus or bad-smelling discharge coming from your wound or vagina.  To relieve pain at the wound area or pain caused by hemorrhoids, try taking a warm sitz bath 2-3 times a day.  Do not use tampons or douche until your health care provider says it is okay. Medicines  Take over-the-counter and prescription medicines only as told by your health care provider.  If you were prescribed an antibiotic medicine, take it as told by your health care provider. Do not stop taking the antibiotic even if you start to feel better. Eating and drinking  Drink enough fluids to keep your urine pale yellow.  Eat high-fiber foods every day. These foods may help  prevent or relieve constipation. High-fiber foods include: ? Whole grain cereals and breads. ? Brown rice. ? Beans. ? Fresh fruits and vegetables.   Activity  If possible, have someone help you with your household activities for at least a few days after you leave the hospital.  Return to your normal activities as told by your health care provider. Ask your health care provider what activities are safe for you.  Rest as much as possible.  Talk with your health care provider about when you can engage in sexual activity. This may depend on your: ? Risk of infection. ? Rate of healing. ? Comfort and desire to engage in sexual activity. Emotional support  Consider seeking support for your loss. Some forms of support that you might consider include your religious leader, friends, family, a Pharmacist, hospital, or a bereavement support group. General instructions  Wear a supportive and well-fitting bra.  If you pass a blood clot, save it and call your health care provider to discuss. Do not flush blood clots down the toilet before you get instructions from your health care provider.  Keep all of your scheduled postpartum visits. At these visits, your health care provider will check to make sure that you are healing, both physically and emotionally. Contact a health care provider if:  You feel sad or depressed.  You are having trouble eating or sleeping.  You lose interest in activities you used to enjoy.  You pass a blood clot from your vagina.  You have pus or a bad-smelling discharge coming  from your wound or vagina.  You have increasing redness, swelling, or pain in your perineal area.  You feel pain or burning when you urinate.  You urinate more often than normal.  You have a fever.  Your breasts become hard, red, or painful.  You are dizzy or light-headed.  You have a rash.  You feel nauseous or you vomit.  You have not had a menstrual period by the 12th week  after delivery. Get help right away if:  You have persistent pain that is not relieved by comfort measures or medicines.  You have chest pain or trouble breathing.  You have blurred vision or you see spots.  You have a severe headache.  You faint.  You have sudden, severe leg pain.  You bleed from your vagina so much that you fill more than one sanitary pad in one hour. Bleeding should not be heavier than your heaviest period.  You have thoughts of hurting yourself. If you ever feel like you may hurt yourself or others, or have thoughts about taking your own life, get help right away. You can go to your nearest emergency department or call:  Your local emergency services (911 in the [place]  A suicide crisis helpline, such as the National Suicide Prevention Lifeline at 2060259697. This is open 24 hours a day. Summary  Take over-the-counter and prescription medicines only as told by your health care provider.  Return to your normal activities as told by your health care provider. Ask your health care provider what activities are safe for you.  Consider getting support for your loss. Sources of support include religious leaders, friends, family, professional counselors, and bereavement support groups.  Keep all follow-up visits as told by your health care provider. This is important. This information is not intended to replace advice given to you by your health care provider. Make sure you discuss any questions you have with your health care provider. Document Revised: 03/25/2017 Document Reviewed: 06/19/2016 Elsevier Patient Education  2021 Elsevier Inc.    Vaginal Delivery, Care After This sheet gives you information about how to care for yourself after your delivery. Your health care provider may also give you more specific instructions. If you have problems or questions, contact your health care provider. What can I expect after the procedure? After delivery, it is  common to have:  Soreness in your abdomen, your vagina, and the area between the opening of your vagina and your anus (perineum).  Tiredness (fatigue).  Cramps.  Breast tenderness related to engorgement.  Some bleeding and discharge from your vagina. This may continue for about 6 weeks. The bleeding and discharge will start out red, then become pink, then yellow, and finally white.  Tenderness in your vagina or perineum if you had an episiotomy or a vaginal tear. This may last several weeks.  Emotions that change quickly. Common emotions include: ? Sadness. ? Anger. ? Denial. ? Guilt. ? Depression. Follow these instructions at home: Vaginal and perineal care  Keep your perineum clean and dry as told by your health care provider.  Wipe from front to back when you use the toilet.  If you have an episiotomy or a vaginal tear, check for signs of infection, such as: ? Increasing redness, swelling, or pain in your perineal area. ? Pus or bad-smelling discharge coming from your wound or vagina.  To relieve pain at the wound area or pain caused by hemorrhoids, try taking a warm sitz bath 2-3 times a day.  Do not use tampons or douche until your health care provider says it is okay. Medicines  Take over-the-counter and prescription medicines only as told by your health care provider.  If you were prescribed an antibiotic medicine, take it as told by your health care provider. Do not stop taking the antibiotic even if you start to feel better. Eating and drinking  Drink enough fluids to keep your urine pale yellow.  Eat high-fiber foods every day. These foods may help prevent or relieve constipation. High-fiber foods include: ? Whole grain cereals and breads. ? Brown rice. ? Beans. ? Fresh fruits and vegetables.   Activity  If possible, have someone help you with your household activities for at least a few days after you leave the hospital.  Return to your normal activities  as told by your health care provider. Ask your health care provider what activities are safe for you.  Rest as much as possible.  Talk with your health care provider about when you can engage in sexual activity. This may depend on your: ? Risk of infection. ? Rate of healing. ? Comfort and desire to engage in sexual activity. Emotional support  Consider seeking support for your loss. Some forms of support that you might consider include your religious leader, friends, family, a Pharmacist, hospital, or a bereavement support group. General instructions  Wear a supportive and well-fitting bra.  If you pass a blood clot, save it and call your health care provider to discuss. Do not flush blood clots down the toilet before you get instructions from your health care provider.  Keep all of your scheduled postpartum visits. At these visits, your health care provider will check to make sure that you are healing, both physically and emotionally. Contact a health care provider if:  You feel sad or depressed.  You are having trouble eating or sleeping.  You lose interest in activities you used to enjoy.  You pass a blood clot from your vagina.  You have pus or a bad-smelling discharge coming from your wound or vagina.  You have increasing redness, swelling, or pain in your perineal area.  You feel pain or burning when you urinate.  You urinate more often than normal.  You have a fever.  Your breasts become hard, red, or painful.  You are dizzy or light-headed.  You have a rash.  You feel nauseous or you vomit.  You have not had a menstrual period by the 12th week after delivery. Get help right away if:  You have persistent pain that is not relieved by comfort measures or medicines.  You have chest pain or trouble breathing.  You have blurred vision or you see spots.  You have a severe headache.  You faint.  You have sudden, severe leg pain.  You bleed from your  vagina so much that you fill more than one sanitary pad in one hour. Bleeding should not be heavier than your heaviest period.  You have thoughts of hurting yourself. If you ever feel like you may hurt yourself or others, or have thoughts about taking your own life, get help right away. You can go to your nearest emergency department or call:  Your local emergency services (911 in the [place]  A suicide crisis helpline, such as the National Suicide Prevention Lifeline at 463 547 4918. This is open 24 hours a day. Summary  Take over-the-counter and prescription medicines only as told by your health care provider.  Return to your normal activities as  told by your health care provider. Ask your health care provider what activities are safe for you.  Consider getting support for your loss. Sources of support include religious leaders, friends, family, professional counselors, and bereavement support groups.  Keep all follow-up visits as told by your health care provider. This is important. This information is not intended to replace advice given to you by your health care provider. Make sure you discuss any questions you have with your health care provider. Document Revised: 03/25/2017 Document Reviewed: 06/19/2016 Elsevier Patient Education  2021 [employer].

## 2020-04-18 NOTE — Anesthesia Postprocedure Evaluation (Signed)
Anesthesia Post Note  Patient: Scientist, water quality  Procedure(s) Performed: AN AD HOC LABOR EPIDURAL     Patient location during evaluation: Mother Baby Anesthesia Type: Epidural Level of consciousness: awake Pain management: satisfactory to patient Vital Signs Assessment: post-procedure vital signs reviewed and stable Respiratory status: spontaneous breathing Cardiovascular status: stable Anesthetic complications: no   No complications documented.  Last Vitals:  Vitals:   04/18/20 0625 04/18/20 0818  BP: 112/71 123/83  Pulse: 71 63  Resp: 18   Temp: 36.7 C 36.7 C  SpO2: 99% 100%    Last Pain:  Vitals:   04/18/20 0822  TempSrc:   PainSc: 3    Pain Goal: Patients Stated Pain Goal: 3 (04/18/20 7035)                 Cephus Shelling

## 2020-04-19 LAB — SURGICAL PATHOLOGY

## 2020-04-20 ENCOUNTER — Ambulatory Visit (INDEPENDENT_AMBULATORY_CARE_PROVIDER_SITE_OTHER): Payer: BC Managed Care – PPO | Admitting: Obstetrics and Gynecology

## 2020-04-20 ENCOUNTER — Other Ambulatory Visit: Payer: Self-pay

## 2020-04-20 ENCOUNTER — Encounter: Payer: Self-pay | Admitting: Obstetrics and Gynecology

## 2020-04-20 VITALS — BP 131/84 | HR 78 | Wt 167.5 lb

## 2020-04-20 DIAGNOSIS — O9279 Other disorders of lactation: Secondary | ICD-10-CM

## 2020-04-20 LAB — TORCH-IGM(TOXO/ RUB/ CMV/ HSV) W TITER
CMV IgM: <30 AU/mL (ref 0.0–29.9)
HSVI/II Comb IgM: 1.11 Ratio — ABNORMAL HIGH (ref 0.00–0.90)
Rubella IgM: <20 AU/mL (ref 0.0–19.9)
Toxoplasma Antibody- IgM: <3 AU/mL (ref 0.0–7.9)

## 2020-04-20 LAB — INFECT DISEASE AB IGM REFLEX 1

## 2020-04-20 NOTE — Progress Notes (Signed)
Pt is in the office for follow up for IUFD on 04/18/2020. Pt reporting breast pain 5/10, when touching breasts pain is 10/10. Pt reports that pain started last night and breasts are hard. Edinburgh= 3

## 2020-04-20 NOTE — Progress Notes (Signed)
25 yp P1101 s/p SVD on 1/26 of 22 week IUFD presenting for evaluation of bilateral breast pain. Patient reports both breast are engorged and tender. She admits to not expecting experiencing normal postpartum symptoms following a preterm delivery. Patient is understandably very emotional regarding her recent loss and reports good familial support. She is scheduled to meet with behavioral health next week.  Past Medical History:  Diagnosis Date  . Headache   . Influenza 02/2016  . Medical history non-contributory    Past Surgical History:  Procedure Laterality Date  . CESAREAN SECTION N/A 05/04/2016   Procedure: CESAREAN SECTION;  Surgeon: Lesly Dukes, MD;  Location: [facility] BIRTHING SUITES;  Service: Obstetrics;  Laterality: N/A;   Family History  Problem Relation Age of Onset  . Hyperlipidemia Mother   . Hypertension Mother   . Asthma Other    Social History   Tobacco Use  . Smoking status: Never Smoker  . Smokeless tobacco: Never Used  Vaping Use  . Vaping Use: Never used  Substance Use Topics  . Alcohol use: No  . Drug use: No   ROS See pertinent in HPI. All other systems reviewed and non contributory Blood pressure 131/84, pulse 78, weight 167 lb 8 oz (76 kg), last menstrual period 11/16/2019.  GENERAL: Well-developed, well-nourished female in no acute distress.  BREASTS: Symmetric in size. No palpable masses or lymphadenopathy, skin changes, or nipple drainage. ABDOMEN: Soft, nontender, nondistended. No organomegaly. EXTREMITIES: No cyanosis, clubbing, or edema, 2+ distal pulses.   A/P 25 yo PPD#2 with bilateral breast engorgement - Discussed cold compresses and tight sports bra to diminish milk production - Discussed taking the time to grieve before immediate clearance to return to work. Patient to keep appointment with behavioral health - RTC for postpartum visit - Patient to continue taking prenatal vitamins

## 2020-04-23 ENCOUNTER — Ambulatory Visit: Payer: BC Managed Care – PPO

## 2020-04-24 ENCOUNTER — Ambulatory Visit (INDEPENDENT_AMBULATORY_CARE_PROVIDER_SITE_OTHER): Payer: BC Managed Care – PPO | Admitting: Licensed Clinical Social Worker

## 2020-04-24 ENCOUNTER — Encounter: Payer: Self-pay | Admitting: Advanced Practice Midwife

## 2020-04-24 DIAGNOSIS — Z8619 Personal history of other infectious and parasitic diseases: Secondary | ICD-10-CM | POA: Insufficient documentation

## 2020-04-24 DIAGNOSIS — B009 Herpesviral infection, unspecified: Secondary | ICD-10-CM | POA: Insufficient documentation

## 2020-04-24 DIAGNOSIS — F4321 Adjustment disorder with depressed mood: Secondary | ICD-10-CM

## 2020-04-25 NOTE — BH Specialist Note (Signed)
Integrated Behavioral Health Initial In-Person Visit  MRN: [id number] Name: Deborah Steele  Number of Integrated Behavioral Health Clinician visits:: 1/6 Session Start time: 2:45pm Session End time: 3:00pm Total time: 15 minutes via mychart   Types of Service: General Behavioral Integrated Care (BHI)  Interpretor:No. Interpretor Name and Language: none   Warm Hand Off Completed.       Subjective: Deborah Steele is a 25 y.o. female accompanied by n/a Patient was referred by Chana Bode MD for pregnancy loss. Patient reports the following symptoms/concerns: pregnancy loss Duration of problem: one week ; Severity of problem: mild  Objective: Mood: Depressed and Affect: appropriate  Risk of harm to self or others: No plan to harm self or others  Life Context: Family and Social: Lives with family in [place]  School/Work: n/a Self-Care: prayer Life Changes: pregnancy loss   Patient and/or Family's Strengths/Protective Factors: Concrete supports in place (healthy food, safe environments, etc.)  Goals Addressed: Patient will: 1. Reduce symptoms of: pregnancy loss 2. Increase knowledge and/or ability of: coping skills  3. Demonstrate ability to: Increase healthy adjustment to current life circumstances  Progress towards Goals: Ongoing  Interventions: Interventions utilized: Motivational Interviewing  Standardized Assessments completed: Not Needed   Assessment: Patient currently experiencing pregnancy loss   Patient may benefit from integrated behavioral health   Plan: 1. Follow up with behavioral health clinician on : as needed  2. Behavioral recommendations: create memorial box or journal.  3. Referral(s): pregnancy loss support group  4. "From scale of 1-10, how likely are you to follow plan?":   Gwyndolyn Saxon, LCSW

## 2020-05-03 ENCOUNTER — Other Ambulatory Visit: Payer: Self-pay

## 2020-05-03 ENCOUNTER — Ambulatory Visit
Admission: RE | Admit: 2020-05-03 | Discharge: 2020-05-03 | Disposition: A | Discharge status: Home / Self Care | Payer: BC Managed Care – PPO | Source: Ambulatory Visit | Attending: Obstetrics and Gynecology | Admitting: Obstetrics and Gynecology

## 2020-05-03 ENCOUNTER — Telehealth: Payer: BC Managed Care – PPO | Admitting: Obstetrics and Gynecology

## 2020-05-03 ENCOUNTER — Telehealth (INDEPENDENT_AMBULATORY_CARE_PROVIDER_SITE_OTHER): Payer: BC Managed Care – PPO | Admitting: Obstetrics and Gynecology

## 2020-05-03 DIAGNOSIS — N643 Galactorrhea not associated with childbirth: Secondary | ICD-10-CM

## 2020-05-03 DIAGNOSIS — Z8759 Personal history of other complications of pregnancy, childbirth and the puerperium: Secondary | ICD-10-CM | POA: Insufficient documentation

## 2020-05-03 NOTE — Progress Notes (Signed)
     I connected with@ on 05/03/20 at  4:15 PM EST by: Mychart video and verified that I am speaking with the correct person using two identifiers.  Patient is located at home and provider is located at [employer] for [employer] at [place] .     The purpose of this virtual visit is to provide medical care while limiting exposure to the novel coronavirus. I discussed the limitations, risks, security and privacy concerns of performing an evaluation and management service by video and the availability of in person appointments. I also discussed with the patient that there may be a patient responsible charge related to this service. By engaging in this virtual visit, you consent to the provision of healthcare.  Additionally, you authorize for your insurance to be billed for the services provided during this visit.  The patient expressed understanding and agreed to proceed.  The following staff members participated in the virtual visit:  Mariel Aloe, MD, Natale Milch  Post Partum Visit Note Subjective:   Deborah Steele is a 25 y.o. G6P1101 female being evaluated for postpartum followup.  She is 2 weeks postpartum following a vaginal delivery at  22 gestational weeks.  I have fully reviewed the prenatal and intrapartum course; pregnancy complicated by intrauterine fetal demise.  Postpartum course has been uncomplicated.  Bleeding thin lochia. Bowel function is normal. Bladder function is normal. Patient is not sexually active. Contraception method is abstinence. Postpartum depression screening: negative.   The pregnancy intention screening data noted above was reviewed. Potential methods of contraception were discussed. The patient elected to proceed with Abstinence.   The following portions of the patient's history were reviewed and updated as appropriate: allergies, current medications, past family history, past medical history, past social history, past surgical history and problem  list.  Review of Systems Pertinent items are noted in HPI.   Objective:  There were no vitals filed for this visit. Self-Obtained       Assessment:    normal virtual postpartum exam.  Plan:  Essential components of care per ACOG recommendations:  1.  Mood and well being: Patient with negative depression screening today. Reviewed local resources for support.  - Patient does not use tobacco.  - hx of drug use? No    2.-Social determinants of health (SDOH) reviewed in EPIC. No concerns  3. Sexuality, contraception and birth spacing - Patient does not know if she wants a pregnancy in the next year. - Reviewed forms of contraception in tiered fashion. Patient desired abstinence today.   - Discussed birth spacing of 18 months  4. Sleep and fatigue -Encouraged family/partner/community support of 4 hrs of uninterrupted sleep to help with mood and fatigue  5. Physical Recovery  - Discussed patients delivery and complications - Patient has urinary incontinence? No - Patient is safe to resume physical and sexual activity  6.  Health Maintenance - Last pap smear done 09/2017 and was normal with negative HPV.  10 minutes of non-face-to-face time spent with the patient   Recommended Annual exam and pap 09/2020 Warden Fillers, MD Center for [facility], [facility]

## 2020-05-03 NOTE — Progress Notes (Signed)
S/w pt for virtual visit.  Fetal loss at 22.0 on 04/18/20 Pt reports light bleeding, brown to pink in color, denies pain. Pt states that she is no longer having pain in her breasts just mild tenderness. Edinburgh = 1

## 2020-05-17 ENCOUNTER — Telehealth (INDEPENDENT_AMBULATORY_CARE_PROVIDER_SITE_OTHER): Payer: BC Managed Care – PPO | Admitting: Obstetrics and Gynecology

## 2020-05-17 ENCOUNTER — Encounter: Payer: Self-pay | Admitting: Obstetrics and Gynecology

## 2020-05-17 NOTE — Progress Notes (Signed)
     I connected with@ on 05/17/20 at  2:00 PM EST by: Mychart video and verified that I am speaking with the correct person using two identifiers.  Patient is located at home and provider is located at [employer] for [employer] at [place] .     The purpose of this virtual visit is to provide medical care while limiting exposure to the novel coronavirus. I discussed the limitations, risks, security and privacy concerns of performing an evaluation and management service by MyChart and the availability of in person appointments. I also discussed with the patient that there may be a patient responsible charge related to this service. By engaging in this virtual visit, you consent to the provision of healthcare.  Additionally, you authorize for your insurance to be billed for the services provided during this visit.  The patient expressed understanding and agreed to proceed.   Post Partum Visit Note Subjective:   Deborah Steele is a 25 y.o. G80P1101 female being evaluated for postpartum followup.  She is 4 weeks postpartum following a normal spontaneous vaginal delivery at  22w gestational weeks.  I have fully reviewed the prenatal and intrapartum course; pregnancy complicated by IUFD.  Postpartum course has been unremarkable. Baby was IUFD. Bleeding brown. Bowel function is normal. Bladder function is normal. Patient is not sexually active. Contraception method:abstinence. Postpartum depression screening: negative. Edinburgh scale: 3       Review of Systems Pertinent items noted in HPI and remainder of comprehensive ROS otherwise negative.   Objective:   Vitals:   05/17/20 1321  Weight: 165 lb (74.8 kg)  Height: 5\' 2"  (1.575 m)   Self-Obtained       Assessment:    Normal postpartum exam.  Plan:   Essential components of care per ACOG recommendations:  1.  Mood and well being: Patient with negative depression screening today. Reviewed local resources for support.  - Patient does not  use tobacco.  - hx of drug use? No    2.-Social determinants of health (SDOH) reviewed in EPIC. No concerns  3. Sexuality, contraception and birth spacing - Patient desires a pregnancy in the next 12 months and was advised to start prenatal vitamins. -  4. Sleep and fatigue -Encouraged family/partner/community support of 4 hrs of uninterrupted sleep to help with mood and fatigue  5. Physical Recovery  - Discussed patients delivery and complications - Patient has urinary incontinence? No - Patient is safe to resume physical and sexual activity  6.  Health Maintenance - Last pap smear done 09/2017 and was normal with negative HPV.  15 minutes of non-face-to-face time spent with the patient    10/2017, MD Center for [facility], [facility]

## 2020-05-28 ENCOUNTER — Other Ambulatory Visit: Payer: BC Managed Care – PPO

## 2020-05-28 ENCOUNTER — Other Ambulatory Visit: Payer: Self-pay

## 2020-05-30 ENCOUNTER — Ambulatory Visit: Payer: BC Managed Care – PPO | Admitting: Obstetrics and Gynecology

## 2021-02-22 ENCOUNTER — Other Ambulatory Visit: Payer: Self-pay

## 2021-02-22 ENCOUNTER — Ambulatory Visit (INDEPENDENT_AMBULATORY_CARE_PROVIDER_SITE_OTHER): Payer: BC Managed Care – PPO

## 2021-02-22 VITALS — BP 131/85 | HR 82 | Ht 62.0 in | Wt 168.0 lb

## 2021-02-22 DIAGNOSIS — Z3201 Encounter for pregnancy test, result positive: Secondary | ICD-10-CM | POA: Diagnosis not present

## 2021-02-22 DIAGNOSIS — N912 Amenorrhea, unspecified: Secondary | ICD-10-CM

## 2021-02-22 LAB — POCT URINE PREGNANCY: Preg Test, Ur: POSITIVE — AB

## 2021-02-22 NOTE — Progress Notes (Signed)
Deborah Steele presents today for UPT. She is nervous because of a previous Preterm delivery at 22 weeks.  LMP:01/18/2021    OBJECTIVE: Appears well, in no apparent distress.  OB History     Gravida  3   Para  2   Term  1   Preterm  1   AB  0   Living  1      SAB  0   IAB  0   Ectopic  0   Multiple  0   Live Births  1          Home UPT Result:POSITIVE In-Office UPT result: POSITIVE  I have reviewed the patient's medical, obstetrical, social, and family histories, and medications.   ASSESSMENT: Positive pregnancy test LMP :  01/18/2021 EDD :   10/25/2021 GA    :  [redacted]w[redacted]d  PLAN Prenatal care to be completed at: [facility] Patient advised to check her BP at home and notify us if they are elevated.  She is schedule for BP check in two weeks before she travels for vacation and wont be back until 03/28/21. Intake with [place] will be schedule for after 03/28/21.

## 2021-02-22 NOTE — Progress Notes (Signed)
Patient was assessed and managed by nursing staff during this encounter. I have reviewed the chart and agree with the documentation and plan. I have also made any necessary editorial changes.  Catalina Antigua, MD 02/22/2021 11:22 AM

## 2021-03-04 ENCOUNTER — Ambulatory Visit: Payer: BC Managed Care – PPO

## 2021-03-04 ENCOUNTER — Other Ambulatory Visit: Payer: Self-pay

## 2021-03-04 ENCOUNTER — Ambulatory Visit (INDEPENDENT_AMBULATORY_CARE_PROVIDER_SITE_OTHER): Payer: BC Managed Care – PPO | Admitting: *Deleted

## 2021-03-04 VITALS — BP 128/85 | HR 86

## 2021-03-04 DIAGNOSIS — F419 Anxiety disorder, unspecified: Secondary | ICD-10-CM

## 2021-03-04 DIAGNOSIS — O99341 Other mental disorders complicating pregnancy, first trimester: Secondary | ICD-10-CM

## 2021-03-04 DIAGNOSIS — Z0130 Encounter for examination of blood pressure without abnormal findings: Secondary | ICD-10-CM

## 2021-03-04 DIAGNOSIS — Z3A08 8 weeks gestation of pregnancy: Secondary | ICD-10-CM

## 2021-03-04 NOTE — Progress Notes (Signed)
Patient was assessed and managed by nursing staff during this encounter. I have reviewed the chart and agree with the documentation and plan. I have also made any necessary editorial changes.  Catalina Antigua, MD 03/04/2021 9:25 AM

## 2021-03-04 NOTE — Progress Notes (Signed)
Subjective:  Deborah Steele is a 25 y.o. female here for BP check.   Hypertension ROS: taking medications as instructed, no medication side effects noted, no TIA's, no chest pain on exertion, no dyspnea on exertion, and no swelling of ankles.    Objective:  BP 128/85   Pulse 86   LMP 01/18/2021    Appearance alert, well appearing, and in no distress.  General exam BP noted to be well controlled today in office.    Assessment:   Blood Pressure stable.   Plan:  Current treatment plan is effective, no change in therapy. Follow up for NOB in January.

## 2021-03-07 ENCOUNTER — Telehealth: Payer: Self-pay

## 2021-03-07 ENCOUNTER — Inpatient Hospital Stay (HOSPITAL_COMMUNITY)
Admission: AD | Admit: 2021-03-07 | Discharge: 2021-03-07 | Disposition: A | Discharge status: Home / Self Care | Payer: BC Managed Care – PPO | Attending: Obstetrics & Gynecology | Admitting: Obstetrics & Gynecology

## 2021-03-07 ENCOUNTER — Other Ambulatory Visit: Payer: Self-pay

## 2021-03-07 DIAGNOSIS — Z3A01 Less than 8 weeks gestation of pregnancy: Secondary | ICD-10-CM | POA: Diagnosis not present

## 2021-03-07 DIAGNOSIS — O26891 Other specified pregnancy related conditions, first trimester: Secondary | ICD-10-CM | POA: Insufficient documentation

## 2021-03-07 DIAGNOSIS — M542 Cervicalgia: Secondary | ICD-10-CM | POA: Diagnosis present

## 2021-03-07 MED ORDER — ACETAMINOPHEN 500 MG PO TABS: 1000.0000 mg | ORAL_TABLET | Freq: Once | ORAL | Status: DC

## 2021-03-07 MED ORDER — CYCLOBENZAPRINE HCL 5 MG PO TABS: 10.0000 mg | ORAL_TABLET | Freq: Once | ORAL | Status: DC
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
## 2021-03-24 NOTE — L&D Delivery Note (Signed)
OB/GYN Faculty Practice Delivery Note  Deborah Steele is a 26 y.o. G3P1101 s/p VBAC at [redacted]w[redacted]d. She was admitted for TOLAC/IOL for cHTN.   ROM: with clear 9 hrs 15 min with fluid GBS Status: negative Maximum Maternal Temperature: 99.3  Labor Progress: Presented for TOLAC/IOL, was started on pitocin and had a foley balloon. Once her balloon came out her pitocin was uptitrated and with adequate contractions with an IUPC she progressed to complete.  Delivery Date/Time: 1806 on 7/28 Delivery: Called to room and patient was complete and pushing. Head delivered OA. Nuchal cord x1 present and delivered through. Shoulder and body delivered in usual fashion. Infant with spontaneous cry, placed on mother's abdomen, dried and stimulated. Cord clamped x 2 after 1-minute delay, and cut by father of baby under my direct supervision. Cord blood drawn. Placenta delivered spontaneously with gentle cord traction. Fundus firm with massage and Pitocin. Labia, perineum, vagina, and cervix inspected and found to have a second degree laceration which was repaired with 3-0 vicryl in usual fashion.   Placenta: intact, 3V cord, to L&D Complications: none Lacerations: 2nd degree EBL: 395cc Analgesia: epidural  Infant: female  APGARs 8,0  weight pending  Warner Mccreedy, MD, MPH OB Fellow, Faculty Practice Center for [employer], [facility]

## 2021-04-01 ENCOUNTER — Other Ambulatory Visit: Payer: Self-pay

## 2021-04-01 ENCOUNTER — Ambulatory Visit: Payer: Self-pay

## 2021-04-01 ENCOUNTER — Ambulatory Visit (INDEPENDENT_AMBULATORY_CARE_PROVIDER_SITE_OTHER): Payer: Medicaid Other

## 2021-04-01 VITALS — BP 149/94 | HR 94 | Ht 62.0 in | Wt 168.2 lb

## 2021-04-01 DIAGNOSIS — O0990 Supervision of high risk pregnancy, unspecified, unspecified trimester: Secondary | ICD-10-CM | POA: Insufficient documentation

## 2021-04-01 DIAGNOSIS — O3680X0 Pregnancy with inconclusive fetal viability, not applicable or unspecified: Secondary | ICD-10-CM

## 2021-04-01 MED ORDER — GOJJI WEIGHT SCALE MISC: 1.0000 | 0 refills | Status: AC

## 2021-04-01 NOTE — Progress Notes (Signed)
New OB Intake  I connected with  Deborah Steele on 04/01/21 at  1:15 PM EST by in person and verified that I am speaking with the correct person using two identifiers. Nurse is located at [facility] and pt is located at [place].  I discussed the limitations, risks, security and privacy concerns of performing an evaluation and management service by telephone and the availability of in person appointments. I also discussed with the patient that there may be a patient responsible charge related to this service. The patient expressed understanding and agreed to proceed.  I explained I am completing New OB Intake today. We discussed her EDD of 10/25/21 that is based on LMP of 01/18/21. Pt is G3/P1101. I reviewed her allergies, medications, Medical/Surgical/OB history, and appropriate screenings. I informed her of [facility] services. Based on history, this is a/an  pregnancy complicated by Hx of fetal demise  .   Patient Active Problem List   Diagnosis Date Noted   Encounter for postpartum visit 05/03/2020   History of fetal demise, not currently pregnant 05/03/2020   Herpes 04/24/2020   Vaginal delivery 04/18/2020   IUFD at 57 weeks or more of gestation 04/17/2020   Previous cesarean delivery affecting pregnancy 04/02/2020   Abnormal antenatal AFP screen 03/08/2020   Supervision of high-risk pregnancy 01/31/2020   Chronic hypertension affecting pregnancy 06/27/2019   History of gestational hypertension 05/02/2016    Concerns addressed today  Delivery Plans:  Plans to deliver at [facility].   MyChart/Babyscripts MyChart access verified. I explained pt will have some visits in office and some virtually. Babyscripts instructions given and order placed. Patient verifies receipt of registration text/e-mail. Account successfully created and app downloaded.  Blood Pressure Cuff  Patient has a BP cuff at home already. Explained after first prenatal appt pt will check weekly and document in  85.  Weight scale: Patient does not  have weight scale. Weight scale ordered for patient to pick up from [employer].   Anatomy US Explained first scheduled [place] will be around 19 weeks. Dating and viability scan performed today.  Labs Discussed Johnsie Cancel genetic screening with patient. Would like both Panorama and Horizon drawn at new OB visit. Routine prenatal labs needed.  Covid Vaccine Patient has not covid vaccine.    Informed patient of [facility] website  and placed link in her AVS.   Social Determinants of Health Food Insecurity: Patient denies food insecurity. WIC Referral: Patient is interested in referral to [facility].  Transportation: Patient denies transportation needs. Childcare: Discussed no children allowed at ultrasound appointments. Offered childcare services; patient declines childcare services at this time.  Send link to Pregnancy Navigators   Placed OB Box on problem list and updated  First visit review I reviewed new OB appt with pt. I explained she will have a pelvic exam, ob bloodwork with genetic screening, and PAP smear. Explained pt will be seen by Fatima Blank at first visit; encounter routed to appropriate provider. Explained that patient will be seen by pregnancy navigator following visit with provider. [facility] information placed in AVS.   Lucianne Lei, RN 04/01/2021  1:10 PM

## 2021-04-08 ENCOUNTER — Encounter: Payer: Self-pay | Admitting: Advanced Practice Midwife

## 2021-04-08 ENCOUNTER — Ambulatory Visit (INDEPENDENT_AMBULATORY_CARE_PROVIDER_SITE_OTHER): Payer: BC Managed Care – PPO | Admitting: Licensed Clinical Social Worker

## 2021-04-08 ENCOUNTER — Ambulatory Visit (INDEPENDENT_AMBULATORY_CARE_PROVIDER_SITE_OTHER): Payer: BC Managed Care – PPO | Admitting: Advanced Practice Midwife

## 2021-04-08 ENCOUNTER — Other Ambulatory Visit: Payer: Self-pay

## 2021-04-08 ENCOUNTER — Other Ambulatory Visit (HOSPITAL_COMMUNITY)
Admission: RE | Admit: 2021-04-08 | Discharge: 2021-04-08 | Disposition: A | Discharge status: Home / Self Care | Payer: BC Managed Care – PPO | Source: Ambulatory Visit | Attending: Advanced Practice Midwife | Admitting: Advanced Practice Midwife

## 2021-04-08 VITALS — BP 136/90 | HR 95 | Wt 167.0 lb

## 2021-04-08 DIAGNOSIS — Z3A11 11 weeks gestation of pregnancy: Secondary | ICD-10-CM

## 2021-04-08 DIAGNOSIS — F419 Anxiety disorder, unspecified: Secondary | ICD-10-CM

## 2021-04-08 DIAGNOSIS — O0990 Supervision of high risk pregnancy, unspecified, unspecified trimester: Secondary | ICD-10-CM | POA: Diagnosis not present

## 2021-04-08 DIAGNOSIS — O0992 Supervision of high risk pregnancy, unspecified, second trimester: Secondary | ICD-10-CM

## 2021-04-08 DIAGNOSIS — O10911 Unspecified pre-existing hypertension complicating pregnancy, first trimester: Secondary | ICD-10-CM

## 2021-04-08 DIAGNOSIS — Z8619 Personal history of other infectious and parasitic diseases: Secondary | ICD-10-CM

## 2021-04-08 DIAGNOSIS — O99341 Other mental disorders complicating pregnancy, first trimester: Secondary | ICD-10-CM

## 2021-04-08 DIAGNOSIS — O364XX0 Maternal care for intrauterine death, not applicable or unspecified: Secondary | ICD-10-CM

## 2021-04-08 DIAGNOSIS — B372 Candidiasis of skin and nail: Secondary | ICD-10-CM

## 2021-04-08 MED ORDER — ASPIRIN EC 81 MG PO TBEC: 81.0000 mg | DELAYED_RELEASE_TABLET | Freq: Every day | ORAL | 11 refills | Status: DC

## 2021-04-08 MED ORDER — LABETALOL HCL 200 MG PO TABS: 200.0000 mg | ORAL_TABLET | Freq: Two times a day (BID) | ORAL | 3 refills | Status: DC

## 2021-04-08 MED ORDER — NYSTATIN 100000 UNIT/GM EX CREA: 1.0000 "application " | TOPICAL_CREAM | Freq: Two times a day (BID) | CUTANEOUS | 1 refills | Status: DC

## 2021-04-08 NOTE — Progress Notes (Signed)
Subjective:   Deborah Steele is a 26 y.o. G3P1101 at [redacted]w[redacted]d by LMP being seen today for her first obstetrical visit.  Her obstetrical history is significant for  IUFD at 53 weeks, CHTN  and has History of gestational hypertension; Chronic hypertension affecting pregnancy; Supervision of high-risk pregnancy; Abnormal antenatal AFP screen; Previous cesarean delivery affecting pregnancy; IUFD at 65 weeks or more of gestation; Vaginal delivery; H/O cold sores; Encounter for postpartum visit; History of fetal demise, not currently pregnant; and Supervision of high risk pregnancy, antepartum on their problem list.. Patient does intend to breast feed. Pregnancy history fully reviewed.  Patient reports no complaints.  HISTORY: OB History  Gravida Para Term Preterm AB Living  3 2 1 1  0 1  SAB IAB Ectopic Multiple Live Births  0 0 0 0 1    # Outcome Date GA Lbr Len/2nd Weight Sex Delivery Anes PTL Lv  3 Current           2 Preterm 04/18/20 [redacted]w[redacted]d / 00:22 15.3 oz (0.434 kg) M Vag-Spont EPI  FD     Name: Eisenstein,PENDINGBABY FD  1 Term 05/04/16 [redacted]w[redacted]d  7 lb 2.6 oz (3.25 kg) M CS-LTranv EPI  LIV     Name: Galeas,BOY Hadlei     Apgar1: 8  Apgar5: 9   Past Medical History:  Diagnosis Date   Headache    Influenza 02/2016   Medical history non-contributory    Past Surgical History:  Procedure Laterality Date   CESAREAN SECTION N/A 05/04/2016   Procedure: CESAREAN SECTION;  Surgeon: Guss Bunde, MD;  Location: [place];  Service: Obstetrics;  Laterality: N/A;   Family History  Problem Relation Age of Onset   Hyperlipidemia Mother    Hypertension Mother    Asthma Other    Social History   Tobacco Use   Smoking status: Never   Smokeless tobacco: Never  Vaping Use   Vaping Use: Never used  Substance Use Topics   Alcohol use: Not Currently    Comment: occ prior to preg   Drug use: No   No Known Allergies Current Outpatient Medications on File Prior to Visit  Medication Sig  Dispense Refill   Misc. Devices (GOJJI WEIGHT SCALE) MISC 1 Device by Does not apply route every 30 (thirty) days. 1 each 0   Prenatal Vit-Fe Fumarate-FA (PRENATAL VITAMINS PO) Take by mouth.     No current facility-administered medications on file prior to visit.     Indications for ASA therapy (per uptodate) One of the following: Previous pregnancy with preeclampsia, especially early onset and with an adverse outcome No Multifetal gestation No Chronic hypertension Yes Type 1 or 2 diabetes mellitus No Chronic kidney disease No Autoimmune disease (antiphospholipid syndrome, systemic lupus erythematosus) No   Indications for early 1 hour GTT (per uptodate)  BMI >25 (>23 in Asian women) AND one of the following  Gestational diabetes mellitus in a previous pregnancy No Glycated hemoglobin ?5.7 percent (39 mmol/mol), impaired glucose tolerance, or impaired fasting glucose on previous testing No First-degree relative with diabetes No High-risk race/ethnicity (eg, African American, Latino, Native American, [place] American, Pacific Islander) Yes History of cardiovascular disease Yes Hypertension or on therapy for hypertension No High-density lipoprotein cholesterol level <35 mg/dL (0.90 mmol/L) and/or a triglyceride level >250 mg/dL (2.82 mmol/L) No Polycystic ovary syndrome No Physical inactivity No Other clinical condition associated with insulin resistance (eg, severe obesity, acanthosis nigricans) No Previous birth of an infant weighing ?4000 g  No Previous stillbirth of unknown cause Yes Exam   Vitals:   04/08/21 1313 04/08/21 1321  BP: 138/87 136/90  Pulse: 93 95  Weight: 167 lb (75.8 kg)    Fetal Heart Rate (bpm): +bedside u/s  Uterus:     Pelvic Exam: Perineum: no hemorrhoids, normal perineum   Vulva: normal external genitalia, no lesions   Vagina:  normal mucosa, normal discharge   Cervix: no lesions and normal, pap smear done.    Adnexa: normal adnexa and no mass,  fullness, tenderness   Bony Pelvis: average  System: General: well-developed, well-nourished female in no acute distress   Breast:  normal appearance, no masses or tenderness   Skin: normal coloration and turgor, no rashes   Neurologic: oriented, normal, negative, normal mood   Extremities: normal strength, tone, and muscle mass, ROM of all joints is normal   HEENT PERRLA, extraocular movement intact and sclera clear, anicteric   Mouth/Teeth mucous membranes moist, pharynx normal without lesions and dental hygiene good   Neck supple and no masses   Cardiovascular: regular rate and rhythm   Respiratory:  no respiratory distress, normal breath sounds   Abdomen: soft, non-tender; bowel sounds normal; no masses,  no organomegaly     Assessment:   Pregnancy: [id number] Patient Active Problem List   Diagnosis Date Noted   Supervision of high risk pregnancy, antepartum 04/01/2021   Encounter for postpartum visit 05/03/2020   History of fetal demise, not currently pregnant 05/03/2020   H/O cold sores 04/24/2020   Vaginal delivery 04/18/2020   IUFD at 20 weeks or more of gestation 04/17/2020   Previous cesarean delivery affecting pregnancy 04/02/2020   Abnormal antenatal AFP screen 03/08/2020   Supervision of high-risk pregnancy 01/31/2020   Chronic hypertension affecting pregnancy 06/27/2019   History of gestational hypertension 05/02/2016     Plan:  1. Supervision of high risk pregnancy, antepartum --Hx 21 week IUFD with low AFI and growth restriction --Anticipatory guidance about next visits/weeks of pregnancy given. --F/U in 4 weeks with MD  2. Chronic hypertension in obstetric context in first trimester --BP borderline today, consult Dr Alysia Penna.  Start labetalol 200 mg BID.  - aspirin EC 81 MG tablet; Take 1 tablet (81 mg total) by mouth daily. Swallow whole.  Dispense: 30 tablet; Refill: 11 - labetalol (NORMODYNE) 200 MG tablet; Take 1 tablet (200 mg total) by mouth 2 (two) times  daily.  Dispense: 60 tablet; Refill: 3  3. [redacted] weeks gestation of pregnancy   4. IUFD at 20 weeks or more of gestation --Pt concerned about possible PPROM with low AFI, questions about [place] --Message sent to MFM for follow up, early anatomy US, NT [place]? --Pt to f/u with MD  5. 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  6. Candidal skin infection --Itching, flaking in groin area x 1-2 weeks - nystatin cream (MYCOSTATIN); Apply 1 application topically 2 (two) times daily.  Dispense: 30 g; Refill: 1    Initial labs drawn. Continue prenatal vitamins. Discussed and offered genetic screening options, including Quad screen/AFP, NIPS testing, and option to decline testing. Benefits/risks/alternatives reviewed. Pt aware that anatomy US is form of genetic screening with lower accuracy in detecting trisomies than blood work.  Pt chooses genetic screening today. NIPS: ordered. Ultrasound discussed; fetal anatomic survey: ordered. Problem list reviewed and updated. The nature of [facility] Faculty Practice with multiple MDs and other Advanced  Practice Providers was explained to patient; also emphasized that residents, students are part of our team. Routine obstetric precautions reviewed. Return in about 4 weeks (around 05/06/2021).   Fatima Blank, CNM 04/08/21 6:16 PM

## 2021-04-09 ENCOUNTER — Other Ambulatory Visit: Payer: Self-pay | Admitting: Advanced Practice Midwife

## 2021-04-09 DIAGNOSIS — O364XX0 Maternal care for intrauterine death, not applicable or unspecified: Secondary | ICD-10-CM

## 2021-04-09 LAB — CBC/D/PLT+RPR+RH+ABO+RUBIGG...
Antibody Screen: NEGATIVE
Basophils Absolute: 0 10*3/uL (ref 0.0–0.2)
Basos: 0 %
EOS (ABSOLUTE): 0.1 10*3/uL (ref 0.0–0.4)
Eos: 1 %
HCV Ab: <0.1 s/co ratio (ref 0.0–0.9)
HIV Screen 4th Generation wRfx: NONREACTIVE
Hematocrit: 38.5 % (ref 34.0–46.6)
Hemoglobin: 13.1 g/dL (ref 11.1–15.9)
Hepatitis B Surface Ag: NEGATIVE
Immature Grans (Abs): 0 10*3/uL (ref 0.0–0.1)
Immature Granulocytes: 0 %
Lymphocytes Absolute: 1.7 10*3/uL (ref 0.7–3.1)
Lymphs: 22 %
MCH: 29 pg (ref 26.6–33.0)
MCHC: 34 g/dL (ref 31.5–35.7)
MCV: 85 fL (ref 79–97)
Monocytes Absolute: 0.4 10*3/uL (ref 0.1–0.9)
Monocytes: 6 %
Neutrophils Absolute: 5.5 10*3/uL (ref 1.4–7.0)
Neutrophils: 71 %
Platelets: 302 10*3/uL (ref 150–450)
RBC: 4.52 x10E6/uL (ref 3.77–5.28)
RDW: 12.1 % (ref 11.7–15.4)
RPR Ser Ql: NONREACTIVE
Rh Factor: POSITIVE
Rubella Antibodies, IGG: 13.1 index (ref >0.99)
WBC: 7.7 10*3/uL (ref 3.4–10.8)

## 2021-04-09 LAB — URINE CYTOLOGY ANCILLARY ONLY
Chlamydia: NEGATIVE
Comment: NEGATIVE
Comment: NEGATIVE
Comment: NORMAL
Neisseria Gonorrhea: NEGATIVE
Trichomonas: NEGATIVE

## 2021-04-09 LAB — HCV INTERPRETATION

## 2021-04-09 NOTE — BH Specialist Note (Signed)
Integrated Behavioral Health Initial In-Person Visit  MRN: [id number] Name: Deborah Steele  Number of Integrated Behavioral Health Clinician visits:: 1/6 Session Start time: 1:30pm  Session End time: 1:35pm Total time: 15 minutes in person at [facility]   Types of Service: General Integrated Behavioral Health   Interpretor:No. Interpretor Name and Language: None    Warm Hand Off Completed.        Subjective: Deborah Steele is a 26 y.o. female accompanied by n/a Patient was referred by A Burch RN for hx of pregnancy loss. Patient reports the following symptoms/concerns: anxious mood Duration of problem: approx 3 weeks; Severity of problem: mild  Objective: Mood: good and Affect: Appropriate Risk of harm to self or others: No plan to harm self or others  Life Context: Family and Social: Lives with spouse School/Work: Tax inspector  Self-Care: n/a Life Changes: New pregnancy  Patient and/or Family's Strengths/Protective Factors: Concrete supports in place (healthy food, safe environments, etc.)  Goals Addressed: Patient will: Reduce symptoms of: anxiety Increase knowledge and/or ability of: coping skills  Demonstrate ability to: Increase healthy adjustment to current life circumstances  Progress towards Goals: Ongoing  Interventions: Interventions utilized: Supportive Counseling  Standardized Assessments completed: PHQ 9  Patient and/or Family Response: good   Assessment: Patient currently experiencing .   Patient may benefit from integrated behavioral health.  Plan: Follow up with behavioral health clinician on : as needed  Behavioral recommendations: Prioritize rest, delegate task to prevent burnout and reduce stress, keep medical appts and communicate needs for added support  Referral(s): Integrated [employer] (In Clinic) "From scale of 1-10, how likely are you to follow plan?":    Gwyndolyn Saxon, LCSW

## 2021-04-10 ENCOUNTER — Encounter: Payer: Self-pay | Admitting: Obstetrics and Gynecology

## 2021-04-10 LAB — URINE CULTURE, OB REFLEX: Organism ID, Bacteria: NO GROWTH

## 2021-04-10 LAB — CULTURE, OB URINE

## 2021-04-15 ENCOUNTER — Encounter: Payer: Self-pay | Admitting: Obstetrics and Gynecology

## 2021-04-16 ENCOUNTER — Encounter: Payer: Self-pay | Admitting: Advanced Practice Midwife

## 2021-04-17 ENCOUNTER — Encounter: Payer: Self-pay | Admitting: Advanced Practice Midwife

## 2021-04-19 ENCOUNTER — Ambulatory Visit: Payer: BC Managed Care – PPO | Attending: Advanced Practice Midwife

## 2021-04-19 ENCOUNTER — Ambulatory Visit: Payer: BC Managed Care – PPO | Admitting: *Deleted

## 2021-04-19 ENCOUNTER — Other Ambulatory Visit: Payer: Self-pay | Admitting: *Deleted

## 2021-04-19 ENCOUNTER — Other Ambulatory Visit: Payer: Self-pay

## 2021-04-19 VITALS — BP 118/75 | HR 79

## 2021-04-19 DIAGNOSIS — O09291 Supervision of pregnancy with other poor reproductive or obstetric history, first trimester: Secondary | ICD-10-CM

## 2021-04-19 DIAGNOSIS — Z3A13 13 weeks gestation of pregnancy: Secondary | ICD-10-CM | POA: Diagnosis not present

## 2021-04-19 DIAGNOSIS — O364XX0 Maternal care for intrauterine death, not applicable or unspecified: Secondary | ICD-10-CM | POA: Insufficient documentation

## 2021-04-19 DIAGNOSIS — O10011 Pre-existing essential hypertension complicating pregnancy, first trimester: Secondary | ICD-10-CM

## 2021-04-19 DIAGNOSIS — O10911 Unspecified pre-existing hypertension complicating pregnancy, first trimester: Secondary | ICD-10-CM

## 2021-04-19 DIAGNOSIS — O0990 Supervision of high risk pregnancy, unspecified, unspecified trimester: Secondary | ICD-10-CM | POA: Insufficient documentation

## 2021-04-19 DIAGNOSIS — Z8759 Personal history of other complications of pregnancy, childbirth and the puerperium: Secondary | ICD-10-CM

## 2021-05-06 ENCOUNTER — Encounter: Payer: Self-pay | Admitting: Obstetrics and Gynecology

## 2021-05-06 ENCOUNTER — Other Ambulatory Visit: Payer: Self-pay

## 2021-05-06 ENCOUNTER — Ambulatory Visit (INDEPENDENT_AMBULATORY_CARE_PROVIDER_SITE_OTHER): Payer: BC Managed Care – PPO | Admitting: Obstetrics and Gynecology

## 2021-05-06 VITALS — BP 133/81 | HR 80 | Wt 169.0 lb

## 2021-05-06 DIAGNOSIS — O10919 Unspecified pre-existing hypertension complicating pregnancy, unspecified trimester: Secondary | ICD-10-CM

## 2021-05-06 DIAGNOSIS — O0992 Supervision of high risk pregnancy, unspecified, second trimester: Secondary | ICD-10-CM

## 2021-05-06 DIAGNOSIS — O0990 Supervision of high risk pregnancy, unspecified, unspecified trimester: Secondary | ICD-10-CM

## 2021-05-06 DIAGNOSIS — Z3A15 15 weeks gestation of pregnancy: Secondary | ICD-10-CM

## 2021-05-06 DIAGNOSIS — O10912 Unspecified pre-existing hypertension complicating pregnancy, second trimester: Secondary | ICD-10-CM

## 2021-05-06 DIAGNOSIS — O34219 Maternal care for unspecified type scar from previous cesarean delivery: Secondary | ICD-10-CM

## 2021-05-06 NOTE — Progress Notes (Signed)
Pt presents for ROB and AFP.  

## 2021-05-06 NOTE — Progress Notes (Signed)
° °  PRENATAL VISIT NOTE  Subjective:  Deborah Steele is a 26 y.o. G3P1101 at [redacted]w[redacted]d being seen today for ongoing prenatal care.  She is currently monitored for the following issues for this high-risk pregnancy and has History of gestational hypertension; Chronic hypertension affecting pregnancy; Supervision of high-risk pregnancy; Abnormal antenatal AFP screen; Previous cesarean delivery affecting pregnancy; IUFD at 54 weeks or more of gestation; H/O cold sores; Encounter for postpartum visit; and Supervision of high risk pregnancy, antepartum on their problem list.  Patient reports no complaints.  Contractions: Not present. Vag. Bleeding: None.  Movement: Absent. Denies leaking of fluid.   The following portions of the patient's history were reviewed and updated as appropriate: allergies, current medications, past family history, past medical history, past social history, past surgical history and problem list.   Objective:   Vitals:   05/06/21 1406  BP: 133/81  Pulse: 80  Weight: 169 lb (76.7 kg)    Fetal Status: Fetal Heart Rate (bpm): 143   Movement: Absent     General:  Alert, oriented and cooperative. Patient is in no acute distress.  Skin: Skin is warm and dry. No rash noted.   Cardiovascular: Normal heart rate noted  Respiratory: Normal respiratory effort, no problems with respiration noted  Abdomen: Soft, gravid, appropriate for gestational age.  Pain/Pressure: Absent     Pelvic: Cervical exam deferred        Extremities: Normal range of motion.  Edema: None  Mental Status: Normal mood and affect. Normal behavior. Normal judgment and thought content.   Assessment and Plan:  Pregnancy: G3P1101 at [redacted]w[redacted]d 1. Supervision of high risk pregnancy, antepartum Patient is doing well but very anxious given her history AFP today Anatomy ultrasound scheduled  2. Chronic hypertension affecting pregnancy Stable on labetalol 200 BID  3. Previous cesarean delivery affecting pregnancy Will  discuss mode of delivery at a later visit  Preterm labor symptoms and general obstetric precautions including but not limited to vaginal bleeding, contractions, leaking of fluid and fetal movement were reviewed in detail with the patient. Please refer to After Visit Summary for other counseling recommendations.   Return in about 4 weeks (around 06/03/2021) for in person, ROB, High risk.  Future Appointments  Date Time Provider [place]  06/03/2021  1:50 PM Jakhiya Brower, Vickii Chafe, MD [facility] None  06/10/2021  1:15 PM [facility] NURSE [facility]  06/10/2021  1:30 PM [facility] US3 [facility] [place]    Mora Bellman, MD

## 2021-05-07 ENCOUNTER — Encounter: Payer: BC Managed Care – PPO | Admitting: Advanced Practice Midwife

## 2021-05-08 LAB — AFP, SERUM, OPEN SPINA BIFIDA
AFP MoM: 1.16
AFP Value: 34.3 ng/mL
Gest. Age on Collection Date: 15.3 weeks
Maternal Age At EDD: 28.1 yr
OSBR Risk 1 IN: 7366
Test Results:: NEGATIVE
Weight: 169 [lb_av]

## 2021-05-08 LAB — HEMOGLOBIN A1C
Est. average glucose Bld gHb Est-mCnc: 94 mg/dL
Hgb A1c MFr Bld: 4.9 % (ref 4.8–5.6)

## 2021-06-03 ENCOUNTER — Ambulatory Visit (INDEPENDENT_AMBULATORY_CARE_PROVIDER_SITE_OTHER): Payer: BC Managed Care – PPO | Admitting: Obstetrics and Gynecology

## 2021-06-03 ENCOUNTER — Encounter: Payer: Self-pay | Admitting: Obstetrics and Gynecology

## 2021-06-03 ENCOUNTER — Other Ambulatory Visit: Payer: Self-pay

## 2021-06-03 VITALS — BP 124/79 | HR 92 | Wt 172.0 lb

## 2021-06-03 DIAGNOSIS — O34219 Maternal care for unspecified type scar from previous cesarean delivery: Secondary | ICD-10-CM

## 2021-06-03 DIAGNOSIS — O10919 Unspecified pre-existing hypertension complicating pregnancy, unspecified trimester: Secondary | ICD-10-CM

## 2021-06-03 DIAGNOSIS — O0990 Supervision of high risk pregnancy, unspecified, unspecified trimester: Secondary | ICD-10-CM

## 2021-06-03 NOTE — Progress Notes (Signed)
Pt presents for ROB reports concerns about not gaining enough weight this pregnancy.  ?

## 2021-06-03 NOTE — Progress Notes (Signed)
? ?  PRENATAL VISIT NOTE ? ?Subjective:  ?Deborah Steele is a 26 y.o. G3P1101 at [redacted]w[redacted]d being seen today for ongoing prenatal care.  She is currently monitored for the following issues for this high-risk pregnancy and has History of gestational hypertension; Chronic hypertension affecting pregnancy; Supervision of high-risk pregnancy; Abnormal antenatal AFP screen; Previous cesarean delivery affecting pregnancy; IUFD at 72 weeks or more of gestation; H/O cold sores; Encounter for postpartum visit; and Supervision of high risk pregnancy, antepartum on their problem list. ? ?Patient reports no complaints.  Contractions: Not present. Vag. Bleeding: None.  Movement: Present. Denies leaking of fluid.  ? ?The following portions of the patient's history were reviewed and updated as appropriate: allergies, current medications, past family history, past medical history, past social history, past surgical history and problem list.  ? ?Objective:  ? ?Vitals:  ? 06/03/21 1354  ?BP: 124/79  ?Pulse: 92  ?Weight: 172 lb (78 kg)  ? ? ?Fetal Status: Fetal Heart Rate (bpm): 151   Movement: Present    ? ?General:  Alert, oriented and cooperative. Patient is in no acute distress.  ?Skin: Skin is warm and dry. No rash noted.   ?Cardiovascular: Normal heart rate noted  ?Respiratory: Normal respiratory effort, no problems with respiration noted  ?Abdomen: Soft, gravid, appropriate for gestational age.  Pain/Pressure: Absent     ?Pelvic: Cervical exam deferred        ?Extremities: Normal range of motion.  Edema: None  ?Mental Status: Normal mood and affect. Normal behavior. Normal judgment and thought content.  ? ?Assessment and Plan:  ?Pregnancy: G3P1101 at [redacted]w[redacted]d ?1. Supervision of high risk pregnancy, antepartum ?Patient is doing well without complaints ?Anatomy ultrasound scheduled on 3/20 ? ?2. Chronic hypertension affecting pregnancy ?Stable on labetalol ?Continue ASA ? ?3. Previous cesarean delivery affecting pregnancy ?TOLAC information  provided for review ?Patient undecided at this time ? ?Preterm labor symptoms and general obstetric precautions including but not limited to vaginal bleeding, contractions, leaking of fluid and fetal movement were reviewed in detail with the patient. ?Please refer to After Visit Summary for other counseling recommendations.  ? ?Return in about 4 weeks (around 07/01/2021) for ROB, High risk. ? ?Future Appointments  ?Date Time Provider [place]  ?06/10/2021  1:15 PM [facility] NURSE [facility]  ?06/10/2021  1:30 PM [facility] US3 [facility]  ? ? ?Mora Bellman, MD ? ?

## 2021-06-10 ENCOUNTER — Ambulatory Visit: Payer: BC Managed Care – PPO | Admitting: *Deleted

## 2021-06-10 ENCOUNTER — Ambulatory Visit: Payer: BC Managed Care – PPO | Attending: Obstetrics and Gynecology

## 2021-06-10 ENCOUNTER — Encounter: Payer: Self-pay | Admitting: *Deleted

## 2021-06-10 ENCOUNTER — Other Ambulatory Visit: Payer: Self-pay | Admitting: *Deleted

## 2021-06-10 ENCOUNTER — Other Ambulatory Visit: Payer: Self-pay

## 2021-06-10 ENCOUNTER — Encounter: Payer: Self-pay | Admitting: Advanced Practice Midwife

## 2021-06-10 ENCOUNTER — Ambulatory Visit (HOSPITAL_BASED_OUTPATIENT_CLINIC_OR_DEPARTMENT_OTHER): Payer: BC Managed Care – PPO | Admitting: Obstetrics

## 2021-06-10 VITALS — BP 119/65 | HR 78

## 2021-06-10 DIAGNOSIS — O10912 Unspecified pre-existing hypertension complicating pregnancy, second trimester: Secondary | ICD-10-CM | POA: Diagnosis present

## 2021-06-10 DIAGNOSIS — Z3A20 20 weeks gestation of pregnancy: Secondary | ICD-10-CM | POA: Diagnosis present

## 2021-06-10 DIAGNOSIS — O34219 Maternal care for unspecified type scar from previous cesarean delivery: Secondary | ICD-10-CM

## 2021-06-10 DIAGNOSIS — O4402 Placenta previa specified as without hemorrhage, second trimester: Secondary | ICD-10-CM | POA: Diagnosis present

## 2021-06-10 DIAGNOSIS — Z6835 Body mass index (BMI) 35.0-35.9, adult: Secondary | ICD-10-CM

## 2021-06-10 DIAGNOSIS — O10012 Pre-existing essential hypertension complicating pregnancy, second trimester: Secondary | ICD-10-CM

## 2021-06-10 DIAGNOSIS — Z8759 Personal history of other complications of pregnancy, childbirth and the puerperium: Secondary | ICD-10-CM | POA: Insufficient documentation

## 2021-06-10 DIAGNOSIS — O09292 Supervision of pregnancy with other poor reproductive or obstetric history, second trimester: Secondary | ICD-10-CM | POA: Diagnosis present

## 2021-06-10 DIAGNOSIS — O0990 Supervision of high risk pregnancy, unspecified, unspecified trimester: Secondary | ICD-10-CM | POA: Diagnosis present

## 2021-06-10 DIAGNOSIS — O99212 Obesity complicating pregnancy, second trimester: Secondary | ICD-10-CM | POA: Diagnosis not present

## 2021-06-10 DIAGNOSIS — O4440 Low lying placenta NOS or without hemorrhage, unspecified trimester: Secondary | ICD-10-CM | POA: Insufficient documentation

## 2021-06-10 DIAGNOSIS — O10911 Unspecified pre-existing hypertension complicating pregnancy, first trimester: Secondary | ICD-10-CM | POA: Insufficient documentation

## 2021-06-10 DIAGNOSIS — O09299 Supervision of pregnancy with other poor reproductive or obstetric history, unspecified trimester: Secondary | ICD-10-CM

## 2021-06-10 DIAGNOSIS — O10919 Unspecified pre-existing hypertension complicating pregnancy, unspecified trimester: Secondary | ICD-10-CM

## 2021-06-10 NOTE — Progress Notes (Signed)
MFM Note ? ?Deborah Steele was seen for a detailed fetal anatomy scan due to chronic hypertension treated with labetalol.  She has a prior IUFD at 44 weeks.  That pregnancy was complicated by an elevated MSAFP of 3.33 MoM. ? ?She denies any other significant past medical history and denies any problems in her current pregnancy.   ? ?She had a cell free DNA test earlier in her pregnancy which indicated a low risk for trisomy 29, 31, and 13.  The patient did not want the fetal gender revealed today.  The MSAFP level in her current pregnancy is 1.16 MoM (within normal limits). ? ?She was informed that the fetal growth and amniotic fluid level were appropriate for her gestational age.  ? ?There were no obvious fetal anomalies noted on today's ultrasound exam.  The views of the fetal anatomy were limited today due to the fetal position. ? ?The patient was informed that anomalies may be missed due to technical limitations. If the fetus is in a suboptimal position or maternal habitus is increased, visualization of the fetus in the maternal uterus may be impaired. ? ?The following were discussed during today's consultation: ? ?Chronic hypertension in pregnancy ? ?The implications and management of chronic hypertension in pregnancy was discussed.  ? ?She was advised to continue taking labetalol for treatment of her blood pressures throughout her pregnancy.  The dosage of her antihypertensive medication may need to be increased later in pregnancy should her blood pressures remain elevated.   ? ?The increased risk of superimposed preeclampsia, an indicated preterm delivery, and possible fetal growth restriction due to chronic hypertension in pregnancy was discussed.  ? ?We will continue to follow her with monthly growth scans. Weekly fetal testing should be started at around 32 weeks.  ? ?To decrease her risk of superimposed preeclampsia, she should continue taking a daily baby aspirin (81 mg daily) for preeclampsia prophylaxis.   ? ?Prior IUFD at 31 weeks ? ?Although the cause of her IUFD was undetermined, the fact that she had an elevated MSAFP of 3.33 MoM in her last pregnancy indicates that the demise may have been related to placental dysfunction. ? ?As the fetal growth is within normal limits today and as her MSAFP level of 1.16 MoM in her current pregnancy is within normal limits, she was reassured that the risk of another IUFD is probably low.   ? ?We will continue to follow her with monthly growth ultrasounds and will start weekly fetal testing at 32 weeks. ? ?Placenta previa ? ?A low-lying placenta was noted on today's exam.   ? ?The patient was reassured that the placenta previa will most likely resolve later in her pregnancy. ? ?A follow-up exam was scheduled in 4 weeks to assess the fetal growth and to complete the views of the fetal anatomy.   ? ?The patient stated that all of her questions have been answered today. ? ?A total of 30 minutes was spent counseling and coordinating the care for this patient.  Greater than 50% of the time was spent in direct face-to-face contact. ?

## 2021-06-12 ENCOUNTER — Encounter: Payer: Self-pay | Admitting: Obstetrics and Gynecology

## 2021-06-20 ENCOUNTER — Encounter: Payer: Self-pay | Admitting: Obstetrics and Gynecology

## 2021-06-21 ENCOUNTER — Encounter (HOSPITAL_COMMUNITY): Payer: Self-pay | Admitting: Family Medicine

## 2021-06-21 ENCOUNTER — Other Ambulatory Visit: Payer: Self-pay

## 2021-06-21 ENCOUNTER — Inpatient Hospital Stay (HOSPITAL_COMMUNITY)
Admission: AD | Admit: 2021-06-21 | Discharge: 2021-06-21 | Disposition: A | Discharge status: Home / Self Care | Payer: BC Managed Care – PPO | Attending: Family Medicine | Admitting: Family Medicine

## 2021-06-21 DIAGNOSIS — Z3A22 22 weeks gestation of pregnancy: Secondary | ICD-10-CM

## 2021-06-21 DIAGNOSIS — O2242 Hemorrhoids in pregnancy, second trimester: Secondary | ICD-10-CM | POA: Insufficient documentation

## 2021-06-21 DIAGNOSIS — K5900 Constipation, unspecified: Secondary | ICD-10-CM | POA: Diagnosis not present

## 2021-06-21 DIAGNOSIS — O99612 Diseases of the digestive system complicating pregnancy, second trimester: Secondary | ICD-10-CM | POA: Insufficient documentation

## 2021-06-21 DIAGNOSIS — O4440 Low lying placenta NOS or without hemorrhage, unspecified trimester: Secondary | ICD-10-CM | POA: Diagnosis not present

## 2021-06-21 DIAGNOSIS — O09292 Supervision of pregnancy with other poor reproductive or obstetric history, second trimester: Secondary | ICD-10-CM | POA: Diagnosis not present

## 2021-06-21 DIAGNOSIS — K625 Hemorrhage of anus and rectum: Secondary | ICD-10-CM

## 2021-06-21 HISTORY — DX: Essential (primary) hypertension: I10

## 2021-06-21 MED ORDER — DOCUSATE SODIUM 250 MG PO CAPS
250.0000 mg | ORAL_CAPSULE | Freq: Two times a day (BID) | ORAL | 0 refills | Status: AC
Start: 2021-06-21 — End: 2021-07-21

## 2021-06-21 NOTE — MAU Provider Note (Addendum)
?History  ?  ? ?CSN: [id number] ? ?Arrival date and time: 06/21/21 1707 ? ? Event Date/Time  ? First Provider Initiated Contact with Patient 06/21/21 1752   ?  ? ?Chief Complaint  ?Patient presents with  ? Bleeding  ? ?26 yo G3P1101 at [redacted]w[redacted]d with hx of IUFD at 21 weeks p/w constipation and bright red blood per rectum with wiping after BM. Patient states that constipation has been an issue throughout her current pregnancy. Earlier in her pregnancy she was advised to use colace/Miralax. She has not been using Miralax and has been used the colace ~3 times without clear improvement. She often will strain with Bms and earlier today she had a BM which she strained for and after noticed blood when wiping. She did not notice any blood in her urine or in the toilet. No other bleeding noted, no LOF, no contractions. Good fetal movement. No dysuria, no abdominal pain, no vaginal discharge, no HA/blurry vision/LE edema. No recent sex, no recent abdominal trauma. ? ? ?OB History   ? ? Gravida  ?3  ? Para  ?2  ? Term  ?1  ? Preterm  ?1  ? AB  ?0  ? Living  ?1  ?  ? ? SAB  ?0  ? IAB  ?0  ? Ectopic  ?0  ? Multiple  ?0  ? Live Births  ?1  ?   ?  ?  ? ? ?Past Medical History:  ?Diagnosis Date  ? Hypertension   ? Influenza 02/2016  ? Medical history non-contributory   ? ? ?Past Surgical History:  ?Procedure Laterality Date  ? CESAREAN SECTION N/A 05/04/2016  ? Procedure: CESAREAN SECTION;  Surgeon: Guss Bunde, MD;  Location: [place];  Service: Obstetrics;  Laterality: N/A;  ? ? ?Family History  ?Problem Relation Age of Onset  ? Hyperlipidemia Mother   ? Hypertension Mother   ? Asthma Other   ? ? ?Social History  ? ?Tobacco Use  ? Smoking status: Never  ? Smokeless tobacco: Never  ?Vaping Use  ? Vaping Use: Never used  ?Substance Use Topics  ? Alcohol use: Not Currently  ?  Comment: occ prior to preg  ? Drug use: No  ? ? ?Allergies: No Known Allergies ? ?Medications Prior to Admission  ?Medication Sig Dispense Refill Last  Dose  ? aspirin EC 81 MG tablet Take 1 tablet (81 mg total) by mouth daily. Swallow whole. 30 tablet 11   ? labetalol (NORMODYNE) 200 MG tablet Take 1 tablet (200 mg total) by mouth 2 (two) times daily. 60 tablet 3   ? Misc. Devices (GOJJI WEIGHT SCALE) MISC 1 Device by Does not apply route every 30 (thirty) days. 1 each 0   ? nystatin cream (MYCOSTATIN) Apply 1 application topically 2 (two) times daily. (Patient not taking: Reported on 06/03/2021) 30 g 1   ? Prenatal Vit-Fe Fumarate-FA (PRENATAL VITAMINS PO) Take by mouth.     ? ? ?Review of Systems  ?Constitutional:  Negative for appetite change and fever.  ?Eyes:  Negative for visual disturbance.  ?Respiratory:  Negative for cough and shortness of breath.   ?Cardiovascular:  Negative for chest pain and leg swelling.  ?Gastrointestinal:  Positive for blood in stool and constipation. Negative for abdominal pain and diarrhea.  ?Genitourinary:  Negative for difficulty urinating, dysuria, flank pain, vaginal bleeding, vaginal discharge and vaginal pain.  ?Neurological:  Negative for syncope and headaches.  ?Physical Exam  ? ?Blood pressure 123/78,  pulse 81, temperature 98.3 ?F (36.8 ?C), temperature source Oral, resp. rate 19, height 5\' 2"  (1.575 m), weight 79 kg, last menstrual period 01/18/2021, SpO2 100 %. ? ?Physical Exam ?Constitutional:   ?   General: She is not in acute distress. ?   Appearance: Normal appearance. She is not ill-appearing.  ?HENT:  ?   Head: Normocephalic and atraumatic.  ?   Right Ear: External ear normal.  ?   Left Ear: External ear normal.  ?   Nose: Nose normal.  ?Eyes:  ?   Extraocular Movements: Extraocular movements intact.  ?   Pupils: Pupils are equal, round, and reactive to light.  ?Cardiovascular:  ?   Rate and Rhythm: Normal rate and regular rhythm.  ?   Pulses: Normal pulses.  ?   Heart sounds: Normal heart sounds.  ?Pulmonary:  ?   Effort: Pulmonary effort is normal. No respiratory distress.  ?   Breath sounds: Normal breath  sounds.  ?Abdominal:  ?   Tenderness: There is no abdominal tenderness.  ?Genitourinary: ?   Comments: Exam deferred, performed by CNM ?Musculoskeletal:  ?   Cervical back: Normal range of motion and neck supple.  ?   Right lower leg: No edema.  ?   Left lower leg: No edema.  ?Skin: ?   General: Skin is warm and dry.  ?Neurological:  ?   General: No focal deficit present.  ?   Mental Status: She is alert and oriented to person, place, and time. Mental status is at baseline.  ? ? ?MAU Course  ? ? ?MDM ?Fetal heart tones ?Physical exam  ?Bedside ultrasound ?Close follow-up assured ? ?Assessment and Plan  ?Constipation ?Persistent throughout current pregnancy. Recommended BID colace 250mg  until having softer regular bowel movements with less straining. Consider adding miralax if constipation persists. If having looser stools with colace may decrease to once daily dosing. F/u at routine OB visit.  ?Discharge home in stable condition ? ?Rectal Bleeding ?Hemorrhoids ?Visualized on exam by CNM. Will improve with good bowel regimen and resolution of constipation. ? ?Hx of 21 week IUFD ?Reassurance of current fetal wellbeing provided. Good heart tones and bedside ultrasound performed by CNM.  ? ?Low lying Placenta ?Reassured on exam that bleeding is from rectum and not from vagina/cervix. Counseled on avoidance of anything in the vagina given low lying placenta. ? ?Daniel Nones, MD ?FM PGY-1 ? ?06/21/2021, 5:52 PM  ?

## 2021-06-21 NOTE — MAU Note (Signed)
Deborah Steele is a 26 y.o. at [redacted]w[redacted]d here in MAU reporting: bleeding with wiping after having a BM.  Reports has constipation & stool was hard to pass.  States voided after BM and no bleeding noted. ? ?Onset of complaint: today ?Pain score: 0 ?Vitals:  ? 06/21/21 1731  ?BP: 123/78  ?Pulse: 81  ?Resp: 19  ?Temp: 98.3 ?F (36.8 ?C)  ?SpO2: 100%  ?   ?FHT: 145 bpm w/+FM. Denies LOF. ?Lab orders placed from triage:    ?

## 2021-06-21 NOTE — MAU Provider Note (Signed)
?History  ?  ? ?CSN: [id number] ? ?Arrival date and time: 06/21/21 1707 ? ? Event Date/Time  ? First Provider Initiated Contact with Patient 06/21/21 1752   ?  ? ?Chief Complaint  ?Patient presents with  ? Bleeding  ? ?HPI ?Deborah Steele is a 26 y.o. G3P1101 at [redacted]w[redacted]d who presents with bleeding after bowel movements. She reports she has been struggling with constipation for 2 weeks. She reports it is hard to go and she is passing hard stools. She last went this am and that's when she saw th bleeding. She has not had anymore bleeding. She denies any abdominal pain. Reports normal fetal movement.  ? ?OB History   ? ? Gravida  ?3  ? Para  ?2  ? Term  ?1  ? Preterm  ?1  ? AB  ?0  ? Living  ?1  ?  ? ? SAB  ?0  ? IAB  ?0  ? Ectopic  ?0  ? Multiple  ?0  ? Live Births  ?1  ?   ?  ?  ? ? ?Past Medical History:  ?Diagnosis Date  ? Hypertension   ? Influenza 02/2016  ? Medical history non-contributory   ? ? ?Past Surgical History:  ?Procedure Laterality Date  ? CESAREAN SECTION N/A 05/04/2016  ? Procedure: CESAREAN SECTION;  Surgeon: Lesly Dukes, MD;  Location: [facility];  Service: Obstetrics;  Laterality: N/A;  ? ? ?Family History  ?Problem Relation Age of Onset  ? Hyperlipidemia Mother   ? Hypertension Mother   ? Asthma Other   ? ? ?Social History  ? ?Tobacco Use  ? Smoking status: Never  ? Smokeless tobacco: Never  ?Vaping Use  ? Vaping Use: Never used  ?Substance Use Topics  ? Alcohol use: Not Currently  ?  Comment: occ prior to preg  ? Drug use: No  ? ? ?Allergies: No Known Allergies ? ?No medications prior to admission.  ? ? ?Review of Systems  ?Constitutional: Negative.  Negative for fatigue and fever.  ?HENT: Negative.    ?Respiratory: Negative.  Negative for shortness of breath.   ?Cardiovascular: Negative.  Negative for chest pain.  ?Gastrointestinal:  Positive for rectal pain. Negative for abdominal pain, constipation, diarrhea, nausea and vomiting.  ?Genitourinary: Negative.  Negative for dysuria.   ?Neurological: Negative.  Negative for dizziness and headaches.  ?Physical Exam  ? ?Blood pressure 119/68, pulse 84, temperature 98.3 ?F (36.8 ?C), temperature source Oral, resp. rate 19, height 5\' 2"  (1.575 m), weight 79 kg, last menstrual period 01/18/2021, SpO2 100 %. ? ?Physical Exam ?Vitals and nursing note reviewed.  ?Constitutional:   ?   General: She is not in acute distress. ?   Appearance: She is well-developed.  ?HENT:  ?   Head: Normocephalic.  ?Eyes:  ?   Pupils: Pupils are equal, round, and reactive to light.  ?Cardiovascular:  ?   Rate and Rhythm: Normal rate and regular rhythm.  ?   Heart sounds: Normal heart sounds.  ?Pulmonary:  ?   Effort: Pulmonary effort is normal. No respiratory distress.  ?   Breath sounds: Normal breath sounds.  ?Abdominal:  ?   General: Bowel sounds are normal. There is no distension.  ?   Palpations: Abdomen is soft.  ?   Tenderness: There is no abdominal tenderness.  ?Genitourinary: ?   Rectum: External hemorrhoid present.  ?Skin: ?   General: Skin is warm and dry.  ?Neurological:  ?   Mental  Status: She is alert and oriented to person, place, and time.  ?Psychiatric:     ?   Mood and Affect: Mood normal.     ?   Behavior: Behavior normal.     ?   Thought Content: Thought content normal.     ?   Judgment: Judgment normal.  ? ?FHT: 145 bpm ? ?MAU Course  ?Procedures ? ?MDM ?Rectal exam revealed one small external hemorrhoid, actively oozing bright red blood. Patient states it is not painful.  ? ?Hemorrhoid and constipation management reviewed at length.  ?Patient understandably nervous about this pregnancy after loss. Reassurance provided.  ? ?Assessment and Plan  ? ?1. Rectal bleeding   ?2. Hemorrhoids during pregnancy in second trimester   ?3. [redacted] weeks gestation of pregnancy   ? ?-Discharge home in stable condition ?-Rx for colace sent to patient's pharmacy ?-Hemorrhoid precautions discussed ?-Patient advised to follow-up with OB as scheduled for prenatal care ?-Patient  may return to MAU as needed or if her condition were to change or worsen ? ? ?Rolm Bookbinder CNM ?06/21/2021, 7:10 PM  ?

## 2021-06-21 NOTE — Discharge Instructions (Signed)

## 2021-07-01 ENCOUNTER — Ambulatory Visit (INDEPENDENT_AMBULATORY_CARE_PROVIDER_SITE_OTHER): Payer: BC Managed Care – PPO | Admitting: Obstetrics and Gynecology

## 2021-07-01 ENCOUNTER — Encounter: Payer: BC Managed Care – PPO | Admitting: Obstetrics and Gynecology

## 2021-07-01 ENCOUNTER — Encounter: Payer: Self-pay | Admitting: Obstetrics and Gynecology

## 2021-07-01 VITALS — BP 122/77 | HR 84 | Wt 177.8 lb

## 2021-07-01 DIAGNOSIS — O4440 Low lying placenta NOS or without hemorrhage, unspecified trimester: Secondary | ICD-10-CM

## 2021-07-01 DIAGNOSIS — O0990 Supervision of high risk pregnancy, unspecified, unspecified trimester: Secondary | ICD-10-CM

## 2021-07-01 DIAGNOSIS — O10919 Unspecified pre-existing hypertension complicating pregnancy, unspecified trimester: Secondary | ICD-10-CM

## 2021-07-01 DIAGNOSIS — O0992 Supervision of high risk pregnancy, unspecified, second trimester: Secondary | ICD-10-CM

## 2021-07-01 DIAGNOSIS — O34219 Maternal care for unspecified type scar from previous cesarean delivery: Secondary | ICD-10-CM

## 2021-07-01 DIAGNOSIS — O364XX0 Maternal care for intrauterine death, not applicable or unspecified: Secondary | ICD-10-CM

## 2021-07-01 NOTE — Patient Instructions (Signed)

## 2021-07-01 NOTE — Progress Notes (Signed)
Subjective:  ?Deborah Steele is a 26 y.o. G3P1101 at [redacted]w[redacted]d being seen today for ongoing prenatal care.  She is currently monitored for the following issues for this high-risk pregnancy and has History of gestational hypertension; Chronic hypertension affecting pregnancy; Supervision of high-risk pregnancy; Previous cesarean delivery affecting pregnancy; IUFD at 54 weeks or more of gestation; H/O cold sores; Supervision of high risk pregnancy, antepartum; and Low-lying placenta on their problem list. ? ?Patient reports general discomforts of pregnancy.  Contractions: Not present. Vag. Bleeding: None.  Movement: Present. Denies leaking of fluid.  ? ?The following portions of the patient's history were reviewed and updated as appropriate: allergies, current medications, past family history, past medical history, past social history, past surgical history and problem list. Problem list updated. ? ?Objective:  ? ?Vitals:  ? 07/01/21 1114  ?BP: 122/77  ?Pulse: 84  ?Weight: 177 lb 12.8 oz (80.6 kg)  ? ? ?Fetal Status: Fetal Heart Rate (bpm): 156   Movement: Present    ? ?General:  Alert, oriented and cooperative. Patient is in no acute distress.  ?Skin: Skin is warm and dry. No rash noted.   ?Cardiovascular: Normal heart rate noted  ?Respiratory: Normal respiratory effort, no problems with respiration noted  ?Abdomen: Soft, gravid, appropriate for gestational age. Pain/Pressure: Absent     ?Pelvic:  Cervical exam deferred        ?Extremities: Normal range of motion.  Edema: None  ?Mental Status: Normal mood and affect. Normal behavior. Normal judgment and thought content.  ? ?Urinalysis:     ? ?Assessment and Plan:  ?Pregnancy: G3P1101 at [redacted]w[redacted]d ? ?1. Supervision of high risk pregnancy in second trimester ?Stable ?Glucola next visit ? ? ?2. Chronic hypertension affecting pregnancy ?BP stable ?Continue with Labetalol ?Antenatal testing and growth scans as per protocol ? ?3. IUFD at 2 weeks or more of gestation ?See above ? ?4.  Previous cesarean delivery affecting pregnancy ?Discuss at next appt ? ?5. Low-lying placenta ?F/U U/S scheduled ? ? ? ?Preterm labor symptoms and general obstetric precautions including but not limited to vaginal bleeding, contractions, leaking of fluid and fetal movement were reviewed in detail with the patient. ?Please refer to After Visit Summary for other counseling recommendations.  ?Return in about 4 weeks (around 07/29/2021) for OB visit, face to face, MD only, fasting for Glucola. ? ? ?Chancy Milroy, MD ?

## 2021-07-01 NOTE — Progress Notes (Signed)
Pt in office for [place] visit. Pt c/o pain in right hip for 3 day that worsen while sleeping. Pt has no other complaints at this time. ?

## 2021-07-03 LAB — URINE CULTURE

## 2021-07-08 ENCOUNTER — Ambulatory Visit: Payer: BC Managed Care – PPO | Attending: Obstetrics

## 2021-07-08 ENCOUNTER — Ambulatory Visit: Payer: BC Managed Care – PPO

## 2021-07-08 ENCOUNTER — Ambulatory Visit: Payer: BC Managed Care – PPO | Admitting: *Deleted

## 2021-07-08 ENCOUNTER — Other Ambulatory Visit: Payer: Self-pay | Admitting: *Deleted

## 2021-07-08 VITALS — BP 115/65 | HR 73

## 2021-07-08 DIAGNOSIS — Z6835 Body mass index (BMI) 35.0-35.9, adult: Secondary | ICD-10-CM | POA: Diagnosis present

## 2021-07-08 DIAGNOSIS — O0990 Supervision of high risk pregnancy, unspecified, unspecified trimester: Secondary | ICD-10-CM | POA: Diagnosis present

## 2021-07-08 DIAGNOSIS — O4442 Low lying placenta NOS or without hemorrhage, second trimester: Secondary | ICD-10-CM | POA: Insufficient documentation

## 2021-07-08 DIAGNOSIS — O99212 Obesity complicating pregnancy, second trimester: Secondary | ICD-10-CM

## 2021-07-08 DIAGNOSIS — Z8759 Personal history of other complications of pregnancy, childbirth and the puerperium: Secondary | ICD-10-CM

## 2021-07-08 DIAGNOSIS — Z3A24 24 weeks gestation of pregnancy: Secondary | ICD-10-CM | POA: Diagnosis not present

## 2021-07-08 DIAGNOSIS — O34219 Maternal care for unspecified type scar from previous cesarean delivery: Secondary | ICD-10-CM | POA: Diagnosis present

## 2021-07-08 DIAGNOSIS — O4440 Low lying placenta NOS or without hemorrhage, unspecified trimester: Secondary | ICD-10-CM | POA: Diagnosis present

## 2021-07-08 DIAGNOSIS — O10919 Unspecified pre-existing hypertension complicating pregnancy, unspecified trimester: Secondary | ICD-10-CM

## 2021-07-08 DIAGNOSIS — O10912 Unspecified pre-existing hypertension complicating pregnancy, second trimester: Secondary | ICD-10-CM | POA: Diagnosis not present

## 2021-07-08 DIAGNOSIS — O10012 Pre-existing essential hypertension complicating pregnancy, second trimester: Secondary | ICD-10-CM | POA: Diagnosis not present

## 2021-07-08 DIAGNOSIS — E669 Obesity, unspecified: Secondary | ICD-10-CM

## 2021-07-08 DIAGNOSIS — O09292 Supervision of pregnancy with other poor reproductive or obstetric history, second trimester: Secondary | ICD-10-CM | POA: Diagnosis not present

## 2021-07-29 ENCOUNTER — Other Ambulatory Visit: Payer: BC Managed Care – PPO

## 2021-07-30 ENCOUNTER — Encounter: Payer: Self-pay | Admitting: Obstetrics and Gynecology

## 2021-07-31 ENCOUNTER — Ambulatory Visit (INDEPENDENT_AMBULATORY_CARE_PROVIDER_SITE_OTHER): Payer: BC Managed Care – PPO | Admitting: Obstetrics and Gynecology

## 2021-07-31 ENCOUNTER — Other Ambulatory Visit: Payer: BC Managed Care – PPO

## 2021-07-31 VITALS — BP 120/80 | HR 80 | Wt 180.0 lb

## 2021-07-31 DIAGNOSIS — Z3A27 27 weeks gestation of pregnancy: Secondary | ICD-10-CM

## 2021-07-31 DIAGNOSIS — O34219 Maternal care for unspecified type scar from previous cesarean delivery: Secondary | ICD-10-CM

## 2021-07-31 DIAGNOSIS — O0992 Supervision of high risk pregnancy, unspecified, second trimester: Secondary | ICD-10-CM

## 2021-07-31 DIAGNOSIS — O364XX0 Maternal care for intrauterine death, not applicable or unspecified: Secondary | ICD-10-CM

## 2021-07-31 DIAGNOSIS — O4440 Low lying placenta NOS or without hemorrhage, unspecified trimester: Secondary | ICD-10-CM

## 2021-07-31 DIAGNOSIS — O10919 Unspecified pre-existing hypertension complicating pregnancy, unspecified trimester: Secondary | ICD-10-CM

## 2021-07-31 DIAGNOSIS — O0990 Supervision of high risk pregnancy, unspecified, unspecified trimester: Secondary | ICD-10-CM

## 2021-07-31 NOTE — Progress Notes (Signed)
Patient presents for a ROB and GTT. Patient has no concerns today. Patient would like to defer TDAP to next visit ?

## 2021-07-31 NOTE — Progress Notes (Signed)
? ?  PRENATAL VISIT NOTE ? ?Subjective:  ?Deborah Steele is a 26 y.o. G3P1101 at [redacted]w[redacted]d being seen today for ongoing prenatal care.  She is currently monitored for the following issues for this high-risk pregnancy and has History of gestational hypertension; Chronic hypertension affecting pregnancy; Supervision of high-risk pregnancy; Previous cesarean delivery affecting pregnancy; IUFD at 67 weeks or more of gestation; H/O cold sores; Supervision of high risk pregnancy, antepartum; and Low-lying placenta on their problem list. ? ?Patient doing well with no acute concerns today. She reports  occasional leg pain c/w sciatica .  Contractions: Not present. Vag. Bleeding: None.  Movement: Present. Denies leaking of fluid.  ? ?The following portions of the patient's history were reviewed and updated as appropriate: allergies, current medications, past family history, past medical history, past social history, past surgical history and problem list. Problem list updated. ? ?Objective:  ? ?Vitals:  ? 07/31/21 0946  ?BP: 120/80  ?Pulse: 80  ?Weight: 81.6 kg  ? ? ?Fetal Status: Fetal Heart Rate (bpm): 145 Fundal Height: 28 cm Movement: Present    ? ?General:  Alert, oriented and cooperative. Patient is in no acute distress.  ?Skin: Skin is warm and dry. No rash noted.   ?Cardiovascular: Normal heart rate noted  ?Respiratory: Normal respiratory effort, no problems with respiration noted  ?Abdomen: Soft, gravid, appropriate for gestational age.  Pain/Pressure: Absent     ?Pelvic: Cervical exam deferred        ?Extremities: Normal range of motion.  Edema: Trace  ?Mental Status:  Normal mood and affect. Normal behavior. Normal judgment and thought content.  ? ?Assessment and Plan:  ?Pregnancy: G3P1101 at [redacted]w[redacted]d ? ?1. Supervision of high risk pregnancy in second trimester ?Continue routine care, 2 hour GTT today ?- CBC ?- Glucose Tolerance, 2 Hours w/1 Hour ?- RPR ?- HIV Antibody (routine testing w rflx) ? ?2. [redacted] weeks gestation of  pregnancy ? ? ?3. Chronic hypertension affecting pregnancy ?Good control with labetalol ? ?4. Previous cesarean delivery affecting pregnancy ?Results of low lying placenta are pending, if placenta resolves, we have briefly discussed VBAC versus repeat  section, pt has VBAC form to review ? ?5. IUFD at 65 weeks or more of gestation ? ? ?6. Supervision of high risk pregnancy, antepartum ? ? ?7. Low-lying placenta ?Pt has rescan on 08/05/21, was 1.3 cm from cervix last scan ? ?Preterm labor symptoms and general obstetric precautions including but not limited to vaginal bleeding, contractions, leaking of fluid and fetal movement were reviewed in detail with the patient. ? ?Please refer to After Visit Summary for other counseling recommendations.  ? ?Return in about 2 weeks (around 08/14/2021) for [facility], in person. ? ? ?Lynnda Shields, MD ?Faculty Attending ?[facility] ?  ?

## 2021-08-01 LAB — CBC
Hematocrit: 34.3 % (ref 34.0–46.6)
Hemoglobin: 11.5 g/dL (ref 11.1–15.9)
MCH: 29.9 pg (ref 26.6–33.0)
MCHC: 33.5 g/dL (ref 31.5–35.7)
MCV: 89 fL (ref 79–97)
Platelets: 255 10*3/uL (ref 150–450)
RBC: 3.85 x10E6/uL (ref 3.77–5.28)
RDW: 12.8 % (ref 11.7–15.4)
WBC: 8.3 10*3/uL (ref 3.4–10.8)

## 2021-08-01 LAB — GLUCOSE TOLERANCE, 2 HOURS W/ 1HR
Glucose, 1 hour: 130 mg/dL (ref 70–179)
Glucose, 2 hour: 102 mg/dL (ref 70–152)
Glucose, Fasting: 74 mg/dL (ref 70–91)

## 2021-08-01 LAB — HIV ANTIBODY (ROUTINE TESTING W REFLEX): HIV Screen 4th Generation wRfx: NONREACTIVE

## 2021-08-01 LAB — RPR: RPR Ser Ql: NONREACTIVE

## 2021-08-02 ENCOUNTER — Encounter: Payer: Self-pay | Admitting: Obstetrics and Gynecology

## 2021-08-05 ENCOUNTER — Encounter: Payer: Self-pay | Admitting: *Deleted

## 2021-08-05 ENCOUNTER — Other Ambulatory Visit: Payer: Self-pay | Admitting: *Deleted

## 2021-08-05 ENCOUNTER — Ambulatory Visit: Payer: BC Managed Care – PPO

## 2021-08-05 ENCOUNTER — Ambulatory Visit: Payer: BC Managed Care – PPO | Admitting: *Deleted

## 2021-08-05 ENCOUNTER — Ambulatory Visit: Payer: BC Managed Care – PPO | Attending: Obstetrics and Gynecology

## 2021-08-05 VITALS — BP 111/62 | HR 69

## 2021-08-05 DIAGNOSIS — O4440 Low lying placenta NOS or without hemorrhage, unspecified trimester: Secondary | ICD-10-CM | POA: Diagnosis present

## 2021-08-05 DIAGNOSIS — O0990 Supervision of high risk pregnancy, unspecified, unspecified trimester: Secondary | ICD-10-CM | POA: Diagnosis present

## 2021-08-05 DIAGNOSIS — O34219 Maternal care for unspecified type scar from previous cesarean delivery: Secondary | ICD-10-CM | POA: Diagnosis not present

## 2021-08-05 DIAGNOSIS — O99212 Obesity complicating pregnancy, second trimester: Secondary | ICD-10-CM | POA: Diagnosis present

## 2021-08-05 DIAGNOSIS — Z3A28 28 weeks gestation of pregnancy: Secondary | ICD-10-CM

## 2021-08-05 DIAGNOSIS — O4442 Low lying placenta NOS or without hemorrhage, second trimester: Secondary | ICD-10-CM | POA: Diagnosis present

## 2021-08-05 DIAGNOSIS — O10912 Unspecified pre-existing hypertension complicating pregnancy, second trimester: Secondary | ICD-10-CM | POA: Diagnosis present

## 2021-08-05 DIAGNOSIS — Z8759 Personal history of other complications of pregnancy, childbirth and the puerperium: Secondary | ICD-10-CM

## 2021-08-05 DIAGNOSIS — O10012 Pre-existing essential hypertension complicating pregnancy, second trimester: Secondary | ICD-10-CM

## 2021-08-05 DIAGNOSIS — O09293 Supervision of pregnancy with other poor reproductive or obstetric history, third trimester: Secondary | ICD-10-CM | POA: Diagnosis not present

## 2021-08-05 DIAGNOSIS — O10919 Unspecified pre-existing hypertension complicating pregnancy, unspecified trimester: Secondary | ICD-10-CM

## 2021-08-13 ENCOUNTER — Ambulatory Visit (INDEPENDENT_AMBULATORY_CARE_PROVIDER_SITE_OTHER): Payer: BC Managed Care – PPO | Admitting: Advanced Practice Midwife

## 2021-08-13 VITALS — BP 117/74 | HR 79 | Wt 182.6 lb

## 2021-08-13 DIAGNOSIS — Z23 Encounter for immunization: Secondary | ICD-10-CM | POA: Diagnosis not present

## 2021-08-13 DIAGNOSIS — O364XX0 Maternal care for intrauterine death, not applicable or unspecified: Secondary | ICD-10-CM

## 2021-08-13 DIAGNOSIS — O10911 Unspecified pre-existing hypertension complicating pregnancy, first trimester: Secondary | ICD-10-CM

## 2021-08-13 DIAGNOSIS — O10919 Unspecified pre-existing hypertension complicating pregnancy, unspecified trimester: Secondary | ICD-10-CM

## 2021-08-13 DIAGNOSIS — O34219 Maternal care for unspecified type scar from previous cesarean delivery: Secondary | ICD-10-CM

## 2021-08-13 DIAGNOSIS — Z3A29 29 weeks gestation of pregnancy: Secondary | ICD-10-CM

## 2021-08-13 DIAGNOSIS — O0992 Supervision of high risk pregnancy, unspecified, second trimester: Secondary | ICD-10-CM

## 2021-08-13 MED ORDER — LABETALOL HCL 200 MG PO TABS: 200.0000 mg | ORAL_TABLET | Freq: Two times a day (BID) | ORAL | 5 refills | Status: DC

## 2021-08-13 NOTE — Progress Notes (Signed)
   PRENATAL VISIT NOTE  Subjective:  Deborah Steele is a 26 y.o. G3P1101 at [redacted]w[redacted]d being seen today for ongoing prenatal care.  She is currently monitored for the following issues for this high-risk pregnancy and has History of gestational hypertension; Chronic hypertension affecting pregnancy; Supervision of high-risk pregnancy; Previous cesarean delivery affecting pregnancy; IUFD at 16 weeks or more of gestation; H/O cold sores; Supervision of high risk pregnancy, antepartum; and Low-lying placenta on their problem list.  Patient reports no complaints.  Contractions: Not present. Vag. Bleeding: None.  Movement: Present. Denies leaking of fluid.   The following portions of the patient's history were reviewed and updated as appropriate: allergies, current medications, past family history, past medical history, past social history, past surgical history and problem list.   Objective:   Vitals:   08/13/21 0848  BP: 117/74  Pulse: 79  Weight: 182 lb 9.6 oz (82.8 kg)    Fetal Status: Fetal Heart Rate (bpm): 144 Fundal Height: 30 cm Movement: Present     General:  Alert, oriented and cooperative. Patient is in no acute distress.  Skin: Skin is warm and dry. No rash noted.   Cardiovascular: Normal heart rate noted  Respiratory: Normal respiratory effort, no problems with respiration noted  Abdomen: Soft, gravid, appropriate for gestational age.  Pain/Pressure: Absent     Pelvic: Cervical exam deferred        Extremities: Normal range of motion.  Edema: None  Mental Status: Normal mood and affect. Normal behavior. Normal judgment and thought content.   Assessment and Plan:  Pregnancy: G3P1101 at [redacted]w[redacted]d 1. Supervision of high risk pregnancy in second trimester --Anticipatory guidance about next visits/weeks of pregnancy given.   2. Chronic hypertension affecting pregnancy --BP well controlled on labetalol, Rx renewed today --No s/sx of PEC  3. Previous cesarean delivery affecting  pregnancy --Interested in [place], unsure, next appt with MD to discuss/consent as needed  4. IUFD at 78 weeks or more of gestation --Previous IUFD at 21 weeks --Normal fetal movement today, discussed kick counts, reasons to seek care, questions answered.  5. [redacted] weeks gestation of pregnancy   Preterm labor symptoms and general obstetric precautions including but not limited to vaginal bleeding, contractions, leaking of fluid and fetal movement were reviewed in detail with the patient. Please refer to After Visit Summary for other counseling recommendations.   Return in about 2 weeks (around 08/27/2021) for MD only for VBAC consent, HROB.  Future Appointments  Date Time Provider [place]  09/02/2021  2:15 PM [facility] NURSE [facility]  09/02/2021  2:30 PM [facility] US3 [facility]  09/09/2021  9:15 AM [facility] NST [facility]  09/16/2021  9:15 AM [facility] NST [facility]  09/23/2021  9:15 AM [facility] NST [facility] [place]    Fatima Blank, CNM

## 2021-08-13 NOTE — Progress Notes (Signed)
Pt reports fetal movement, denies pain. Pt requests refills on Labetalol.

## 2021-08-14 ENCOUNTER — Other Ambulatory Visit: Payer: Self-pay

## 2021-08-15 ENCOUNTER — Encounter: Payer: Self-pay | Admitting: Obstetrics and Gynecology

## 2021-08-27 ENCOUNTER — Ambulatory Visit (INDEPENDENT_AMBULATORY_CARE_PROVIDER_SITE_OTHER): Payer: BC Managed Care – PPO | Admitting: Obstetrics and Gynecology

## 2021-08-27 ENCOUNTER — Encounter: Payer: Self-pay | Admitting: Obstetrics and Gynecology

## 2021-08-27 VITALS — BP 110/72 | HR 83 | Wt 182.0 lb

## 2021-08-27 DIAGNOSIS — O10919 Unspecified pre-existing hypertension complicating pregnancy, unspecified trimester: Secondary | ICD-10-CM

## 2021-08-27 DIAGNOSIS — O34219 Maternal care for unspecified type scar from previous cesarean delivery: Secondary | ICD-10-CM

## 2021-08-27 DIAGNOSIS — O364XX0 Maternal care for intrauterine death, not applicable or unspecified: Secondary | ICD-10-CM

## 2021-08-27 DIAGNOSIS — O0990 Supervision of high risk pregnancy, unspecified, unspecified trimester: Secondary | ICD-10-CM

## 2021-08-27 NOTE — Progress Notes (Signed)
Subjective:  Deborah Steele is a 26 y.o. G3P1101 at [redacted]w[redacted]d being seen today for ongoing prenatal care.  She is currently monitored for the following issues for this high-risk pregnancy and has History of gestational hypertension; Chronic hypertension affecting pregnancy; Supervision of high-risk pregnancy; Previous cesarean delivery affecting pregnancy; IUFD at 20 weeks or more of gestation; H/O cold sores; and Supervision of high risk pregnancy, antepartum on their problem list.  Patient reports no complaints.  Contractions: Not present. Vag. Bleeding: None.  Movement: Present. Denies leaking of fluid.   The following portions of the patient's history were reviewed and updated as appropriate: allergies, current medications, past family history, past medical history, past social history, past surgical history and problem list. Problem list updated.  Objective:   Vitals:   08/27/21 1335  BP: 110/72  Pulse: 83  Weight: 182 lb (82.6 kg)    Fetal Status: Fetal Heart Rate (bpm): 142   Movement: Present     General:  Alert, oriented and cooperative. Patient is in no acute distress.  Skin: Skin is warm and dry. No rash noted.   Cardiovascular: Normal heart rate noted  Respiratory: Normal respiratory effort, no problems with respiration noted  Abdomen: Soft, gravid, appropriate for gestational age. Pain/Pressure: Present     Pelvic:  Cervical exam deferred        Extremities: Normal range of motion.  Edema: Trace  Mental Status: Normal mood and affect. Normal behavior. Normal judgment and thought content.   Urinalysis:      Assessment and Plan:  Pregnancy: G3P1101 at [redacted]w[redacted]d  1. Supervision of high risk pregnancy, antepartum Stable  2. Chronic hypertension affecting pregnancy BP stable Continue with current management Serial growth scans and antenatal testing as per protocol  3. IUFD at 20 weeks or more of gestation See above  4. Previous cesarean delivery affecting pregnancy For VBAC,  consented 08/27/21  Preterm labor symptoms and general obstetric precautions including but not limited to vaginal bleeding, contractions, leaking of fluid and fetal movement were reviewed in detail with the patient. Please refer to After Visit Summary for other counseling recommendations.  Return in about 2 weeks (around 09/10/2021) for OB visit, face to face, MD only.   Hermina Staggers, MD

## 2021-08-27 NOTE — Patient Instructions (Signed)

## 2021-09-02 ENCOUNTER — Ambulatory Visit: Payer: BC Managed Care – PPO | Attending: Obstetrics

## 2021-09-02 ENCOUNTER — Encounter: Payer: Self-pay | Admitting: *Deleted

## 2021-09-02 ENCOUNTER — Ambulatory Visit: Payer: BC Managed Care – PPO | Admitting: *Deleted

## 2021-09-02 ENCOUNTER — Other Ambulatory Visit: Payer: Self-pay | Admitting: *Deleted

## 2021-09-02 VITALS — BP 120/71 | HR 78

## 2021-09-02 DIAGNOSIS — O09293 Supervision of pregnancy with other poor reproductive or obstetric history, third trimester: Secondary | ICD-10-CM | POA: Diagnosis not present

## 2021-09-02 DIAGNOSIS — O0990 Supervision of high risk pregnancy, unspecified, unspecified trimester: Secondary | ICD-10-CM | POA: Insufficient documentation

## 2021-09-02 DIAGNOSIS — O10013 Pre-existing essential hypertension complicating pregnancy, third trimester: Secondary | ICD-10-CM

## 2021-09-02 DIAGNOSIS — Z3A32 32 weeks gestation of pregnancy: Secondary | ICD-10-CM | POA: Diagnosis not present

## 2021-09-02 DIAGNOSIS — O10919 Unspecified pre-existing hypertension complicating pregnancy, unspecified trimester: Secondary | ICD-10-CM | POA: Diagnosis present

## 2021-09-02 DIAGNOSIS — Z8759 Personal history of other complications of pregnancy, childbirth and the puerperium: Secondary | ICD-10-CM | POA: Diagnosis present

## 2021-09-02 DIAGNOSIS — O34219 Maternal care for unspecified type scar from previous cesarean delivery: Secondary | ICD-10-CM

## 2021-09-09 ENCOUNTER — Ambulatory Visit (INDEPENDENT_AMBULATORY_CARE_PROVIDER_SITE_OTHER): Payer: BC Managed Care – PPO

## 2021-09-09 ENCOUNTER — Ambulatory Visit (INDEPENDENT_AMBULATORY_CARE_PROVIDER_SITE_OTHER): Payer: BC Managed Care – PPO | Admitting: General Practice

## 2021-09-09 VITALS — BP 117/76 | HR 82

## 2021-09-09 DIAGNOSIS — O10919 Unspecified pre-existing hypertension complicating pregnancy, unspecified trimester: Secondary | ICD-10-CM

## 2021-09-09 NOTE — Progress Notes (Signed)
Pt informed that the ultrasound is considered a limited OB ultrasound and is not intended to be a complete ultrasound exam.  Patient also informed that the ultrasound is not being completed with the intent of assessing for fetal or placental anomalies or any pelvic abnormalities.  Explained that the purpose of today's ultrasound is to assess for  BPP, presentation, and AFI.  Patient acknowledges the purpose of the exam and the limitations of the study.     Chase Caller RN BSN 09/09/21

## 2021-09-10 ENCOUNTER — Ambulatory Visit (INDEPENDENT_AMBULATORY_CARE_PROVIDER_SITE_OTHER): Payer: BC Managed Care – PPO | Admitting: Obstetrics & Gynecology

## 2021-09-10 ENCOUNTER — Encounter: Payer: BC Managed Care – PPO | Admitting: Obstetrics

## 2021-09-10 VITALS — BP 118/75 | HR 82 | Wt 184.1 lb

## 2021-09-10 DIAGNOSIS — O10919 Unspecified pre-existing hypertension complicating pregnancy, unspecified trimester: Secondary | ICD-10-CM

## 2021-09-10 DIAGNOSIS — Z3A33 33 weeks gestation of pregnancy: Secondary | ICD-10-CM

## 2021-09-10 DIAGNOSIS — O0992 Supervision of high risk pregnancy, unspecified, second trimester: Secondary | ICD-10-CM

## 2021-09-10 DIAGNOSIS — O0993 Supervision of high risk pregnancy, unspecified, third trimester: Secondary | ICD-10-CM

## 2021-09-10 DIAGNOSIS — O10913 Unspecified pre-existing hypertension complicating pregnancy, third trimester: Secondary | ICD-10-CM

## 2021-09-10 DIAGNOSIS — O34219 Maternal care for unspecified type scar from previous cesarean delivery: Secondary | ICD-10-CM

## 2021-09-10 NOTE — Progress Notes (Signed)
Pt reports fetal movement with occasional back pain.

## 2021-09-10 NOTE — Progress Notes (Signed)
   PRENATAL VISIT NOTE  Subjective:  Deborah Steele is a 26 y.o. G3P1101 at [redacted]w[redacted]d being seen today for ongoing prenatal care.  She is currently monitored for the following issues for this high-risk pregnancy and has History of gestational hypertension; Chronic hypertension affecting pregnancy; Supervision of high-risk pregnancy; Previous cesarean delivery affecting pregnancy; IUFD at 20 weeks or more of gestation; H/O cold sores; and Supervision of high risk pregnancy, antepartum on their problem list.  Patient reports no complaints.  Contractions: Not present. Vag. Bleeding: None.  Movement: Present. Denies leaking of fluid.   The following portions of the patient's history were reviewed and updated as appropriate: allergies, current medications, past family history, past medical history, past social history, past surgical history and problem list.   Objective:   Vitals:   09/10/21 0853  BP: 118/75  Pulse: 82  Weight: 184 lb 1.6 oz (83.5 kg)    Fetal Status: Fetal Heart Rate (bpm): 145   Movement: Present     General:  Alert, oriented and cooperative. Patient is in no acute distress.  Skin: Skin is warm and dry. No rash noted.   Cardiovascular: Normal heart rate noted  Respiratory: Normal respiratory effort, no problems with respiration noted  Abdomen: Soft, gravid, appropriate for gestational age.  Pain/Pressure: Present     Pelvic: Cervical exam deferred        Extremities: Normal range of motion.  Edema: Trace  Mental Status: Normal mood and affect. Normal behavior. Normal judgment and thought content.    Pregnancy: G3P1101 at [redacted]w[redacted]d 1. Supervision of high risk pregnancy in second trimester Third trimester [redacted]w[redacted]d   2. Previous cesarean delivery affecting pregnancy TOLAC  3. Chronic hypertension affecting pregnancy Well controlled  Preterm labor symptoms and general obstetric precautions including but not limited to vaginal bleeding, contractions, leaking of fluid and fetal  movement were reviewed in detail with the patient. Please refer to After Visit Summary for other counseling recommendations.   Return in about 2 weeks (around 09/24/2021).  Future Appointments  Date Time Provider Department Center  09/16/2021  9:15 AM [facility] NST [facility]  09/23/2021  9:15 AM [facility] NST [facility]  09/23/2021 11:15 AM Warden Fillers, MD [facility] None  10/02/2021  9:30 AM [facility] NURSE [facility]  10/02/2021  9:45 AM [facility] US5 [facility]  10/07/2021 11:15 AM Anyanwu, Jethro Bastos, MD [facility] None  10/14/2021 11:15 AM Leftwich-Kirby, Wilmer Floor, CNM [facility] None  10/21/2021 11:15 AM Anyanwu, Jethro Bastos, MD [facility] None    Scheryl Darter, MD

## 2021-09-11 ENCOUNTER — Encounter: Payer: Self-pay | Admitting: Obstetrics and Gynecology

## 2021-09-11 ENCOUNTER — Telehealth: Payer: Self-pay | Admitting: Emergency Medicine

## 2021-09-11 NOTE — Telephone Encounter (Signed)
TC to patient in response to Mychart message. Pt reports right sided ache. Instructed rest, hydration, repositioning, Tylenol. Given ED precautions.

## 2021-09-16 ENCOUNTER — Ambulatory Visit: Payer: BC Managed Care – PPO | Admitting: *Deleted

## 2021-09-16 ENCOUNTER — Other Ambulatory Visit: Payer: Self-pay

## 2021-09-16 ENCOUNTER — Ambulatory Visit: Payer: Self-pay

## 2021-09-16 VITALS — BP 110/74 | HR 83

## 2021-09-16 DIAGNOSIS — O10919 Unspecified pre-existing hypertension complicating pregnancy, unspecified trimester: Secondary | ICD-10-CM

## 2021-09-23 ENCOUNTER — Ambulatory Visit: Payer: BC Managed Care – PPO | Admitting: *Deleted

## 2021-09-23 ENCOUNTER — Ambulatory Visit (INDEPENDENT_AMBULATORY_CARE_PROVIDER_SITE_OTHER): Payer: BC Managed Care – PPO | Admitting: Obstetrics and Gynecology

## 2021-09-23 ENCOUNTER — Ambulatory Visit (INDEPENDENT_AMBULATORY_CARE_PROVIDER_SITE_OTHER): Payer: BC Managed Care – PPO

## 2021-09-23 VITALS — BP 123/71 | HR 77

## 2021-09-23 VITALS — BP 118/74 | HR 76 | Wt 188.0 lb

## 2021-09-23 DIAGNOSIS — O10919 Unspecified pre-existing hypertension complicating pregnancy, unspecified trimester: Secondary | ICD-10-CM

## 2021-09-23 DIAGNOSIS — O99613 Diseases of the digestive system complicating pregnancy, third trimester: Secondary | ICD-10-CM

## 2021-09-23 DIAGNOSIS — Z3A35 35 weeks gestation of pregnancy: Secondary | ICD-10-CM | POA: Diagnosis not present

## 2021-09-23 DIAGNOSIS — O0993 Supervision of high risk pregnancy, unspecified, third trimester: Secondary | ICD-10-CM

## 2021-09-23 DIAGNOSIS — K219 Gastro-esophageal reflux disease without esophagitis: Secondary | ICD-10-CM

## 2021-09-23 DIAGNOSIS — O0992 Supervision of high risk pregnancy, unspecified, second trimester: Secondary | ICD-10-CM

## 2021-09-23 DIAGNOSIS — O34219 Maternal care for unspecified type scar from previous cesarean delivery: Secondary | ICD-10-CM

## 2021-09-23 MED ORDER — PANTOPRAZOLE SODIUM 20 MG PO TBEC: 20.0000 mg | DELAYED_RELEASE_TABLET | Freq: Every day | ORAL | 1 refills | Status: AC

## 2021-09-23 NOTE — Progress Notes (Signed)
Pt reports increased episodes of reflux w/nausea. She will discuss w/Dr. Donavan Foil later today @ Ob fu visit.   Pt informed that the ultrasound is considered a limited OB ultrasound and is not intended to be a complete ultrasound exam.  Patient also informed that the ultrasound is not being completed with the intent of assessing for fetal or placental anomalies or any pelvic abnormalities.  Explained that the purpose of today's ultrasound is to assess for presentation, BPP and amniotic fluid volume.  Patient acknowledges the purpose of the exam and the limitations of the study.

## 2021-09-23 NOTE — Progress Notes (Signed)
Pt reports fetal movement, denies pain.  

## 2021-09-23 NOTE — Progress Notes (Signed)
Patient was assessed and managed by nursing staff during this encounter. I have reviewed the chart and agree with the documentation and plan. I have also made any necessary editorial changes.  Warden Fillers, MD 09/23/2021 12:10 PM

## 2021-09-23 NOTE — Progress Notes (Signed)
   PRENATAL VISIT NOTE  Subjective:  Deborah Steele is a 26 y.o. G3P1101 at [redacted]w[redacted]d being seen today for ongoing prenatal care.  She is currently monitored for the following issues for this high-risk pregnancy and has History of gestational hypertension; Chronic hypertension affecting pregnancy; Supervision of high-risk pregnancy; Previous cesarean delivery affecting pregnancy; IUFD at 20 weeks or more of gestation; H/O cold sores; Supervision of high risk pregnancy, antepartum; and Gastroesophageal reflux during pregnancy in third trimester, antepartum on their problem list.  Patient doing well with no acute concerns today. She reports heartburn.  Contractions: Not present. Vag. Bleeding: None.  Movement: Present. Denies leaking of fluid.   The following portions of the patient's history were reviewed and updated as appropriate: allergies, current medications, past family history, past medical history, past social history, past surgical history and problem list. Problem list updated.  Objective:   Vitals:   09/23/21 1113  BP: 118/74  Pulse: 76  Weight: 188 lb (85.3 kg)    Fetal Status: Fetal Heart Rate (bpm): 140 Fundal Height: 35 cm Movement: Present     General:  Alert, oriented and cooperative. Patient is in no acute distress.  Skin: Skin is warm and dry. No rash noted.   Cardiovascular: Normal heart rate noted  Respiratory: Normal respiratory effort, no problems with respiration noted  Abdomen: Soft, gravid, appropriate for gestational age.  Pain/Pressure: Absent     Pelvic: Cervical exam deferred        Extremities: Normal range of motion.  Edema: Trace  Mental Status:  Normal mood and affect. Normal behavior. Normal judgment and thought content.   Assessment and Plan:  Pregnancy: G3P1101 at [redacted]w[redacted]d  1. Supervision of high risk pregnancy in second trimester Continue routine prenatal care  2. [redacted] weeks gestation of pregnancy   3. Chronic hypertension affecting pregnancy BP well  controlled, pt compliant with fetal testing Growth scan in 1 week  4. Previous cesarean delivery affecting pregnancy Pt desires TOLAC  5. Gastroesophageal reflux during pregnancy in third trimester, antepartum Pt relayed GERD symptoms and asked for tx  - pantoprazole (PROTONIX) 20 MG tablet; Take 1 tablet (20 mg total) by mouth daily.  Dispense: 30 tablet; Refill: 1  Preterm labor symptoms and general obstetric precautions including but not limited to vaginal bleeding, contractions, leaking of fluid and fetal movement were reviewed in detail with the patient.  Please refer to After Visit Summary for other counseling recommendations.   Return in about 1 week (around 09/30/2021) for [facility], in person, 36 weeks swabs.   Mariel Aloe, MD Faculty Attending [facility] for [facility]

## 2021-10-01 ENCOUNTER — Telehealth: Payer: Self-pay

## 2021-10-01 NOTE — Telephone Encounter (Signed)
  Written orders for Breast Pump and PP belly band signed and faxed to [facility] 10/01/21

## 2021-10-02 ENCOUNTER — Ambulatory Visit: Payer: BC Managed Care – PPO

## 2021-10-02 ENCOUNTER — Ambulatory Visit (HOSPITAL_BASED_OUTPATIENT_CLINIC_OR_DEPARTMENT_OTHER): Payer: BC Managed Care – PPO

## 2021-10-02 ENCOUNTER — Ambulatory Visit: Payer: BC Managed Care – PPO | Attending: Obstetrics | Admitting: *Deleted

## 2021-10-02 VITALS — BP 126/72 | HR 81

## 2021-10-02 DIAGNOSIS — O09293 Supervision of pregnancy with other poor reproductive or obstetric history, third trimester: Secondary | ICD-10-CM

## 2021-10-02 DIAGNOSIS — Z3A36 36 weeks gestation of pregnancy: Secondary | ICD-10-CM | POA: Insufficient documentation

## 2021-10-02 DIAGNOSIS — O10919 Unspecified pre-existing hypertension complicating pregnancy, unspecified trimester: Secondary | ICD-10-CM

## 2021-10-02 DIAGNOSIS — O10913 Unspecified pre-existing hypertension complicating pregnancy, third trimester: Secondary | ICD-10-CM | POA: Diagnosis not present

## 2021-10-02 DIAGNOSIS — O4443 Low lying placenta NOS or without hemorrhage, third trimester: Secondary | ICD-10-CM | POA: Diagnosis not present

## 2021-10-02 DIAGNOSIS — O34219 Maternal care for unspecified type scar from previous cesarean delivery: Secondary | ICD-10-CM

## 2021-10-02 DIAGNOSIS — O10013 Pre-existing essential hypertension complicating pregnancy, third trimester: Secondary | ICD-10-CM

## 2021-10-02 DIAGNOSIS — O0990 Supervision of high risk pregnancy, unspecified, unspecified trimester: Secondary | ICD-10-CM

## 2021-10-07 ENCOUNTER — Other Ambulatory Visit (HOSPITAL_COMMUNITY)
Admission: RE | Admit: 2021-10-07 | Discharge: 2021-10-07 | Disposition: A | Discharge status: Home / Self Care | Payer: BC Managed Care – PPO | Source: Ambulatory Visit | Attending: Obstetrics & Gynecology | Admitting: Obstetrics & Gynecology

## 2021-10-07 ENCOUNTER — Ambulatory Visit (INDEPENDENT_AMBULATORY_CARE_PROVIDER_SITE_OTHER): Payer: BC Managed Care – PPO | Admitting: Obstetrics & Gynecology

## 2021-10-07 ENCOUNTER — Encounter: Payer: Self-pay | Admitting: Obstetrics & Gynecology

## 2021-10-07 VITALS — BP 119/79 | HR 84 | Wt 190.0 lb

## 2021-10-07 DIAGNOSIS — O10919 Unspecified pre-existing hypertension complicating pregnancy, unspecified trimester: Secondary | ICD-10-CM

## 2021-10-07 DIAGNOSIS — Z3A37 37 weeks gestation of pregnancy: Secondary | ICD-10-CM | POA: Diagnosis present

## 2021-10-07 DIAGNOSIS — O0990 Supervision of high risk pregnancy, unspecified, unspecified trimester: Secondary | ICD-10-CM | POA: Insufficient documentation

## 2021-10-07 DIAGNOSIS — O0993 Supervision of high risk pregnancy, unspecified, third trimester: Secondary | ICD-10-CM | POA: Insufficient documentation

## 2021-10-07 DIAGNOSIS — O10913 Unspecified pre-existing hypertension complicating pregnancy, third trimester: Secondary | ICD-10-CM

## 2021-10-07 DIAGNOSIS — O34219 Maternal care for unspecified type scar from previous cesarean delivery: Secondary | ICD-10-CM

## 2021-10-07 NOTE — Patient Instructions (Addendum)
Induction of labor scheduled at midnight 10/18/21, come to [facility] at 11:45 pm on 10/17/21   Return to office for any scheduled appointments. Call the office or go to the MAU at [facility] if: You begin to have strong, frequent contractions Your water breaks.  Sometimes it is a big gush of fluid, sometimes it is just a trickle that keeps getting your underwear wet or running down your legs You have vaginal bleeding.  It is normal to have a small amount of spotting if your cervix was checked.  You do not feel your baby moving like normal.  If you do not, get something to eat and drink and lay down and focus on feeling your baby move.   If your baby is still not moving like normal, you should call the office or go to MAU. Any other obstetric concerns.

## 2021-10-07 NOTE — Progress Notes (Addendum)
PRENATAL VISIT NOTE  Subjective:  Deborah Steele is a 26 y.o. G3P1101 at [redacted]w[redacted]d being seen today for ongoing prenatal care.  She is currently monitored for the following issues for this high-risk pregnancy and has History of gestational hypertension; Chronic hypertension affecting pregnancy; Supervision of high-risk pregnancy; Previous cesarean delivery affecting pregnancy; IUFD at 77 weeks or more of gestation; H/O cold sores; Supervision of high risk pregnancy, antepartum; and Gastroesophageal reflux during pregnancy in third trimester, antepartum on their problem list.  Patient reports no complaints.  Contractions: Not present. Vag. Bleeding: None.  Movement: Present. Denies leaking of fluid.   The following portions of the patient's history were reviewed and updated as appropriate: allergies, current medications, past family history, past medical history, past social history, past surgical history and problem list.   Objective:   Vitals:   10/07/21 1119  BP: 119/79  Pulse: 84  Weight: 190 lb (86.2 kg)    Fetal Status: Fetal Heart Rate (bpm): 135   Movement: Present     General:  Alert, oriented and cooperative. Patient is in no acute distress.  Skin: Skin is warm and dry. No rash noted.   Cardiovascular: Normal heart rate noted  Respiratory: Normal respiratory effort, no problems with respiration noted  Abdomen: Soft, gravid, appropriate for gestational age.  Pain/Pressure: Absent     Pelvic: Cultures obtained in the presence of a chaperone, patient declined cervical exam        Extremities: Normal range of motion.  Edema: None  Mental Status: Normal mood and affect. Normal behavior. Normal judgment and thought content.   [place] MFM OB FOLLOW UP  Result Date: 10/02/2021 ----------------------------------------------------------------------  OBSTETRICS REPORT                       (Signed Final 10/02/2021 04:30 pm) ----------------------------------------------------------------------  Patient Info  ID #:       [id number]                          D.O.B.:  1995/08/19 (26 yrs)  Name:       Deborah Steele                     Visit Date: 10/02/2021 03:44 pm ---------------------------------------------------------------------- Performed By  Attending:        Sander Nephew      Ref. Address:     Faculty                    MD  Performed By:     Margaretann Loveless     [facility]                                                             [place] [facility]  Referred By:      Mora Bellman MD ---------------------------------------------------------------------- Orders  #  Description                           Code        Ordered By  1  [place] MFM OB FOLLOW UP                   [phone]    YU FANG  2  [place] MFM FETAL BPP WO NON               76819.01    YU FANG     STRESS ----------------------------------------------------------------------  #  Order #                     Accession #                Episode #  1  [id number]                   [id number]                 [id number]  2  [id number]                   [id number]                 [id number] ---------------------------------------------------------------------- Indications  Hypertension - Chronic/Pre-existing            O10.019  (Labetalol)  Low lying placenta, antepartum (resolved)      O44.40  Poor obstetric history: Previous IUFD          O09.299  (22wks)  Previous cesarean delivery, antepartum         O34.219  LR NIPS  [redacted] weeks gestation of pregnancy                Z3A.36 ---------------------------------------------------------------------- Vital Signs  BP:          126/72 ---------------------------------------------------------------------- Fetal Evaluation  Num Of Fetuses:         1  Fetal Heart Rate(bpm):  162  Cardiac Activity:       Observed  Presentation:            Cephalic  Placenta:               Posterior  P. Cord Insertion:      Previously Visualized  Amniotic Fluid  AFI FV:      Within normal limits  AFI Sum(cm)     %Tile       Largest Pocket(cm)  10.18           25          4.87  RUQ(cm)       RLQ(cm)       LUQ(cm)        LLQ(cm)  4.87          3.1           0              2.21 ---------------------------------------------------------------------- Biophysical Evaluation  Amniotic F.V:   Pocket => 2 cm             F. Tone:        Observed  F. Movement:    Observed  Score:          8/8  F. Breathing:   Observed ---------------------------------------------------------------------- Biometry  BPD:     87.19  mm     G. Age:  35w 1d         22  %    CI:        75.08   %    70 - 86                                                          FL/HC:      21.3   %    20.8 - 22.6  HC:    319.21   mm     G. Age:  36w 0d         10  %    HC/AC:      1.03        0.92 - 1.05  AC:    309.17   mm     G. Age:  34w 6d         14  %    FL/BPD:     77.8   %    71 - 87  FL:      67.86  mm     G. Age:  34w 6d          9  %    FL/AC:      21.9   %    20 - 24  HUM:        57  mm     G. Age:  33w 1d        < 5  %  LV:        5.6  mm  Est. FW:    2571  gm    5 lb 11 oz      15  % ---------------------------------------------------------------------- OB History  Gravidity:    3         Term:   1        Prem:   1        SAB:   0  TOP:          0       Ectopic:  0        Living: 1 ---------------------------------------------------------------------- Gestational Age  LMP:           36w 5d        Date:  01/18/21                 EDD:   10/25/21  U/S Today:     35w 2d                                        EDD:   11/04/21  Best:          36w 5d     Det. By:  LMP  (01/18/21)          EDD:   10/25/21 ---------------------------------------------------------------------- Anatomy  Cranium:               Appears normal         Aortic Arch:  Previously seen  Cavum:                 Appears  normal         Ductal Arch:            Not well visualized  Ventricles:            Appears normal         Diaphragm:              Appears normal  Choroid Plexus:        Previously seen        Stomach:                Appears normal, left                                                                        sided  Cerebellum:            Previously seen        Abdomen:                Appears normal  Posterior Fossa:       Previously seen        Abdominal Wall:         Previously seen  Nuchal Fold:           Previously seen        Cord Vessels:           Appears normal (3                                                                        vessel cord)  Face:                  Orbits and profile     Kidneys:                Appear normal                         previously seen  Lips:                  Appears normal         Bladder:                Appears normal  Thoracic:              Appears normal         Spine:                  Previously seen  Heart:                 Appears normal         Upper Extremities:      Previously seen                         (4CH,  axis, and                         situs)  RVOT:                  Previously seen        Lower Extremities:      Previously seen  LVOT:                  Previously seen  Other:  VC visualized. 3VV previously visualized.Female gender previously          seen. Heels/feet and Right open hand/ Right 5th digit previously          visualized. Technically difficult due to maternal habitus and fetal          position. ---------------------------------------------------------------------- Cervix Uterus Adnexa  Cervix  Not visualized (advanced GA >24wks)  Uterus  Normal shape and size.  Right Ovary  Not visualized.  Left Ovary  Not visualized. ---------------------------------------------------------------------- Impression  Follow up growth due to chronic hypertension  Normal interval growth with measurements consistent with  dates  Good fetal movement and amniotic fluid  volume  Biophysical profile 8/8 ---------------------------------------------------------------------- Recommendations  Continue weekly testing at her providers office. ----------------------------------------------------------------------              Lin Landsman, MD Electronically Signed Final Report   10/02/2021 04:30 pm ----------------------------------------------------------------------  [place] MFM FETAL BPP WO NON STRESS  Result Date: 10/02/2021 ----------------------------------------------------------------------  OBSTETRICS REPORT                       (Signed Final 10/02/2021 04:30 pm) ---------------------------------------------------------------------- Patient Info  ID #:       [id number]                          D.O.B.:  08/03/1995 (26 yrs)  Name:       Pennie Rushing                     Visit Date: 10/02/2021 03:44 pm ---------------------------------------------------------------------- Performed By  Attending:        Lin Landsman      Ref. Address:     Faculty                    MD  Performed By:     Charlyne Petrin     Location[facility]                                                             [facility]  Referred By:      Mora Bellman MD ---------------------------------------------------------------------- Orders  #  Description                           Code        Ordered By  1  [place] MFM OB FOLLOW UP                   [phone]    YU FANG  2  [place] MFM FETAL BPP WO NON               76819.01    YU FANG     STRESS ----------------------------------------------------------------------  #  Order #                     Accession #                Episode #  1  [id number]                   [id number]                 [id number]  2  [id number]                   [id number]                 [id number]  ---------------------------------------------------------------------- Indications  Hypertension - Chronic/Pre-existing            O10.019  (Labetalol)  Low lying placenta, antepartum (resolved)      O44.40  Poor obstetric history: Previous IUFD          O09.299  (22wks)  Previous cesarean delivery, antepartum         O34.219  LR NIPS  [redacted] weeks gestation of pregnancy                Z3A.36 ---------------------------------------------------------------------- Vital Signs  BP:          126/72 ---------------------------------------------------------------------- Fetal Evaluation  Num Of Fetuses:         1  Fetal Heart Rate(bpm):  162  Cardiac Activity:       Observed  Presentation:           Cephalic  Placenta:               Posterior  P. Cord Insertion:      Previously Visualized  Amniotic Fluid  AFI FV:      Within normal limits  AFI Sum(cm)     %Tile       Largest Pocket(cm)  10.18           25          4.87  RUQ(cm)       RLQ(cm)       LUQ(cm)        LLQ(cm)  4.87          3.1           0              2.21 ---------------------------------------------------------------------- Biophysical Evaluation  Amniotic F.V:   Pocket => 2 cm             F. Tone:        Observed  F. Movement:    Observed  Score:          8/8  F. Breathing:   Observed ---------------------------------------------------------------------- Biometry  BPD:     87.19  mm     G. Age:  35w 1d         22  %    CI:        75.08   %    70 - 86                                                          FL/HC:      21.3   %    20.8 - 22.6  HC:    319.21   mm     G. Age:  36w 0d         10  %    HC/AC:      1.03        0.92 - 1.05  AC:    309.17   mm     G. Age:  34w 6d         14  %    FL/BPD:     77.8   %    71 - 87  FL:      67.86  mm     G. Age:  34w 6d          9  %    FL/AC:      21.9   %    20 - 24  HUM:        57  mm     G. Age:  33w 1d        < 5  %  LV:        5.6  mm  Est. FW:    2571  gm    5 lb 11 oz      15  %  ---------------------------------------------------------------------- OB History  Gravidity:    3         Term:   1        Prem:   1        SAB:   0  TOP:          0       Ectopic:  0        Living: 1 ---------------------------------------------------------------------- Gestational Age  LMP:           36w 5d        Date:  01/18/21                 EDD:   10/25/21  U/S Today:     35w 2d                                        EDD:   11/04/21  Best:          36w 5d     Det. By:  LMP  (01/18/21)          EDD:   10/25/21 ---------------------------------------------------------------------- Anatomy  Cranium:               Appears normal         Aortic Arch:  Previously seen  Cavum:                 Appears normal         Ductal Arch:            Not well visualized  Ventricles:            Appears normal         Diaphragm:              Appears normal  Choroid Plexus:        Previously seen        Stomach:                Appears normal, left                                                                        sided  Cerebellum:            Previously seen        Abdomen:                Appears normal  Posterior Fossa:       Previously seen        Abdominal Wall:         Previously seen  Nuchal Fold:           Previously seen        Cord Vessels:           Appears normal (3                                                                        vessel cord)  Face:                  Orbits and profile     Kidneys:                Appear normal                         previously seen  Lips:                  Appears normal         Bladder:                Appears normal  Thoracic:              Appears normal         Spine:                  Previously seen  Heart:                 Appears normal         Upper Extremities:      Previously seen                         (4CH, axis,  and                         situs)  RVOT:                  Previously seen        Lower Extremities:      Previously seen  LVOT:                   Previously seen  Other:  VC visualized. 3VV previously visualized.Female gender previously          seen. Heels/feet and Right open hand/ Right 5th digit previously          visualized. Technically difficult due to maternal habitus and fetal          position. ---------------------------------------------------------------------- Cervix Uterus Adnexa  Cervix  Not visualized (advanced GA >24wks)  Uterus  Normal shape and size.  Right Ovary  Not visualized.  Left Ovary  Not visualized. ---------------------------------------------------------------------- Impression  Follow up growth due to chronic hypertension  Normal interval growth with measurements consistent with  dates  Good fetal movement and amniotic fluid volume  Biophysical profile 8/8 ---------------------------------------------------------------------- Recommendations  Continue weekly testing at her providers office. ----------------------------------------------------------------------              Sander Nephew, MD Electronically Signed Final Report   10/02/2021 04:30 pm ----------------------------------------------------------------------  US FETAL BPP W/NONSTRESS  Result Date: 09/29/2021 ----------------------------------------------------------------------  OBSTETRICS REPORT                       (Signed Final 09/29/2021 01:17 pm) ---------------------------------------------------------------------- Patient Info  ID #:       [id number]                          D.O.B.:  1995/08/13 (26 yrs)  Name:       Deborah Steele                     Visit Date: 09/23/2021 11:45 am ---------------------------------------------------------------------- Performed By  Attending:        Lynnda Shields MD       Ref. Address:     Faculty  Performed By:     Lu Duffel RNC          [facility]                                                             [place] [facility]  Referred By:      Mora Bellman MD ---------------------------------------------------------------------- Orders  #  Description                           Code        Ordered By  1  US FETAL BPP W/NONSTRESS              [id number]     Lynnda Shields ----------------------------------------------------------------------  #  Order #                     Accession #                Episode #  1  [id number]                   [id number]                 [id number] ---------------------------------------------------------------------- Service(s) Provided  US Fetal BPP W NST                                    [phone] ---------------------------------------------------------------------- Indications  [redacted] weeks gestation of pregnancy                Z3A.35  Hypertension - Chronic/Pre-existing            O10.019 ---------------------------------------------------------------------- Fetal Evaluation  Num Of Fetuses:         1  Preg. Location:         Intrauterine  Cardiac Activity:       Observed  Presentation:           Cephalic  Amniotic Fluid  AFI FV:      Within normal limits  AFI Sum(cm)     %Tile       Largest Pocket(cm)  9.79            20          4.03  RUQ(cm)       RLQ(cm)       LUQ(cm)        LLQ(cm)  4.03          2.24          0              3.52  Comment:    BPP 10/10 ---------------------------------------------------------------------- Biophysical Evaluation  Amniotic F.V:   Pocket => 2 cm             F. Tone:        Observed  F. Movement:    Observed                   N.S.T:          Reactive  F. Breathing:   Observed                   Score:  10/10 ---------------------------------------------------------------------- OB History  Gravidity:    3         Term:   1        Prem:   1        SAB:   0  TOP:          0       Ectopic:  0        Living: 1  ---------------------------------------------------------------------- Gestational Age  LMP:           35w 3d        Date:  01/18/21                 EDD:   10/25/21  Best:          Barbie Haggis 3d     Det. By:  LMP  (01/18/21)          EDD:   10/25/21 ----------------------------------------------------------------------                Lynnda Shields, MD Electronically Signed Final Report   09/29/2021 01:17 pm ----------------------------------------------------------------------   Assessment and Plan:  Pregnancy: [id number] at [redacted]w[redacted]d 1. Chronic hypertension affecting pregnancy 2. Previous cesarean delivery affecting pregnancy Patient desires TOLAC.  BP stable on Labetalol. Already getting weekly BPP as per MFM. Discussed IOL at 39 weeks.  Risks and benefits of induction were reviewed, including failure of method, prolonged labor, need for further intervention, risk of cesarean section.  Patient understand these risks and wish to proceed.   Induction of labor scheduled on 10/18/21 at midnight, orders have been signed and held. She was told to expect a call from [facility] L&D with further instructions about the induction of labor. She was told that the only scheduled time slot was midnight, patients are called in during the morning between 6:30am -9 am to come in for their induction as soon as the rooms and staff are ready for them.     3. [redacted] weeks gestation of pregnancy 4. Supervision of high risk pregnancy, antepartum Cultures done today, will follow up results and manage accordingly. - Culture, beta strep (group b only) - Cervicovaginal ancillary only( [place]) Labor symptoms and general obstetric precautions including but not limited to vaginal bleeding, contractions, leaking of fluid and fetal movement were reviewed in detail with the patient. Please refer to After Visit Summary for other counseling recommendations.   Return in about 1 week (around 10/14/2021) for OFFICE OB VISIT (MD only).  Future Appointments   Date Time Provider [place]  10/09/2021  3:15 PM [facility] NST [facility]  10/14/2021 11:15 AM [place], Kathie Dike, CNM [facility] None  10/14/2021  3:15 PM [facility]ST [facility]  10/18/2021 12:00 AM [facility] SCHED ROOM [facility] None  10/21/2021 11:15 AM Milas Schappell, Sallyanne Havers, MD [facility] None    Verita Schneiders, MD

## 2021-10-07 NOTE — Addendum Note (Signed)
Addended by: Jaynie Collins A on: 10/07/2021 12:33 PM   Modules accepted: Orders

## 2021-10-07 NOTE — Progress Notes (Signed)
NOB, reports no problems today. 

## 2021-10-08 LAB — CERVICOVAGINAL ANCILLARY ONLY
Chlamydia: NEGATIVE
Comment: NEGATIVE
Comment: NORMAL
Neisseria Gonorrhea: NEGATIVE

## 2021-10-09 ENCOUNTER — Ambulatory Visit: Payer: BC Managed Care – PPO | Admitting: *Deleted

## 2021-10-09 ENCOUNTER — Other Ambulatory Visit: Payer: Self-pay

## 2021-10-09 ENCOUNTER — Ambulatory Visit (INDEPENDENT_AMBULATORY_CARE_PROVIDER_SITE_OTHER): Payer: BC Managed Care – PPO

## 2021-10-09 VITALS — BP 123/79 | HR 77

## 2021-10-09 DIAGNOSIS — O10919 Unspecified pre-existing hypertension complicating pregnancy, unspecified trimester: Secondary | ICD-10-CM

## 2021-10-09 NOTE — Progress Notes (Signed)

## 2021-10-11 LAB — CULTURE, BETA STREP (GROUP B ONLY): Strep Gp B Culture: NEGATIVE

## 2021-10-14 ENCOUNTER — Other Ambulatory Visit: Payer: Self-pay

## 2021-10-14 ENCOUNTER — Encounter: Payer: Self-pay | Admitting: Advanced Practice Midwife

## 2021-10-14 ENCOUNTER — Ambulatory Visit: Payer: BC Managed Care – PPO | Admitting: *Deleted

## 2021-10-14 ENCOUNTER — Ambulatory Visit (INDEPENDENT_AMBULATORY_CARE_PROVIDER_SITE_OTHER): Payer: BC Managed Care – PPO | Admitting: Advanced Practice Midwife

## 2021-10-14 ENCOUNTER — Encounter: Payer: BC Managed Care – PPO | Admitting: Advanced Practice Midwife

## 2021-10-14 ENCOUNTER — Ambulatory Visit (INDEPENDENT_AMBULATORY_CARE_PROVIDER_SITE_OTHER): Payer: BC Managed Care – PPO

## 2021-10-14 VITALS — BP 119/80 | HR 78 | Wt 191.2 lb

## 2021-10-14 VITALS — BP 135/78 | HR 73

## 2021-10-14 DIAGNOSIS — Z3A38 38 weeks gestation of pregnancy: Secondary | ICD-10-CM

## 2021-10-14 DIAGNOSIS — O10919 Unspecified pre-existing hypertension complicating pregnancy, unspecified trimester: Secondary | ICD-10-CM

## 2021-10-14 DIAGNOSIS — O34219 Maternal care for unspecified type scar from previous cesarean delivery: Secondary | ICD-10-CM

## 2021-10-14 DIAGNOSIS — O0990 Supervision of high risk pregnancy, unspecified, unspecified trimester: Secondary | ICD-10-CM

## 2021-10-14 NOTE — Progress Notes (Signed)

## 2021-10-14 NOTE — Progress Notes (Signed)
.  rout 

## 2021-10-14 NOTE — Progress Notes (Signed)
   PRENATAL VISIT NOTE  Subjective:  Deborah Steele is a 26 y.o. G3P1101 at [redacted]w[redacted]d being seen today for ongoing prenatal care.  She is currently monitored for the following issues for this high-risk pregnancy and has History of gestational hypertension; Chronic hypertension affecting pregnancy; Previous cesarean delivery affecting pregnancy; IUFD at 20 weeks or more of gestation; H/O cold sores; Supervision of high risk pregnancy, antepartum; and Gastroesophageal reflux during pregnancy in third trimester, antepartum on their problem list.  Patient reports no complaints.  Contractions: Not present. Vag. Bleeding: None.  Movement: Present. Denies leaking of fluid.   The following portions of the patient's history were reviewed and updated as appropriate: allergies, current medications, past family history, past medical history, past social history, past surgical history and problem list.   Objective:   Vitals:   10/14/21 1352  BP: 119/80  Pulse: 78  Weight: 191 lb 3.2 oz (86.7 kg)    Fetal Status: Fetal Heart Rate (bpm): 148   Movement: Present     General:  Alert, oriented and cooperative. Patient is in no acute distress.  Skin: Skin is warm and dry. No rash noted.   Cardiovascular: Normal heart rate noted  Respiratory: Normal respiratory effort, no problems with respiration noted  Abdomen: Soft, gravid, appropriate for gestational age.  Pain/Pressure: Present     Pelvic: Cervical exam deferred        Extremities: Normal range of motion.  Edema: None  Mental Status: Normal mood and affect. Normal behavior. Normal judgment and thought content.   Assessment and Plan:  Pregnancy: G3P1101 at [redacted]w[redacted]d 1. Chronic hypertension affecting pregnancy --BP wnl on labetalol --IOL scheduled 10/18/21  2. Previous cesarean delivery affecting pregnancy --VBAC with 21 week IUFD  3. Supervision of high risk pregnancy, antepartum --Anticipatory guidance about next visits/weeks of pregnancy given.  --IOL  on 10/18/21, discussed IOL with VBAC --Labor readiness, including the [employer] reviewed  4. [redacted] weeks gestation of pregnancy   Term labor symptoms and general obstetric precautions including but not limited to vaginal bleeding, contractions, leaking of fluid and fetal movement were reviewed in detail with the patient. Please refer to After Visit Summary for other counseling recommendations.   Return for As scheduled for induction on 10/18/21.  Future Appointments  Date Time Provider Department Center  10/14/2021  3:15 PM [facility] NST [facility]  10/18/2021 12:00 AM [facility] SCHED ROOM [facility] None    Sharen Counter, CNM

## 2021-10-14 NOTE — Progress Notes (Signed)
Pt reports fetal movement with some pelvic pressure.

## 2021-10-14 NOTE — Progress Notes (Deleted)
   PRENATAL VISIT NOTE  Subjective:  Deborah Steele is a 26 y.o. G3P1101 at [redacted]w[redacted]d being seen today for ongoing prenatal care.  She is currently monitored for the following issues for this {Blank single:19197::"high-risk","low-risk"} pregnancy and has History of gestational hypertension; Chronic hypertension affecting pregnancy; Previous cesarean delivery affecting pregnancy; IUFD at 20 weeks or more of gestation; H/O cold sores; Supervision of high risk pregnancy, antepartum; and Gastroesophageal reflux during pregnancy in third trimester, antepartum on their problem list.  Patient reports {sx:14538}.   .  .   . Denies leaking of fluid.   The following portions of the patient's history were reviewed and updated as appropriate: allergies, current medications, past family history, past medical history, past social history, past surgical history and problem list.   Objective:  There were no vitals filed for this visit.  Fetal Status:           General:  Alert, oriented and cooperative. Patient is in no acute distress.  Skin: Skin is warm and dry. No rash noted.   Cardiovascular: Normal heart rate noted  Respiratory: Normal respiratory effort, no problems with respiration noted  Abdomen: Soft, gravid, appropriate for gestational age.        Pelvic: {Blank single:19197::"Cervical exam performed in the presence of a chaperone","Cervical exam deferred"}        Extremities: Normal range of motion.     Mental Status: Normal mood and affect. Normal behavior. Normal judgment and thought content.   Assessment and Plan:  Pregnancy: G3P1101 at [redacted]w[redacted]d 1. Chronic hypertension affecting pregnancy ***   2. Previous cesarean delivery affecting pregnancy *** --Repeat c/s scheduled 10/18/21  3. Supervision of high risk pregnancy, antepartum ***  4. [redacted] weeks gestation of pregnancy ***  {Blank single:19197::"Term","Preterm"} labor symptoms and general obstetric precautions including but not limited to  vaginal bleeding, contractions, leaking of fluid and fetal movement were reviewed in detail with the patient. Please refer to After Visit Summary for other counseling recommendations.   No follow-ups on file.  Future Appointments  Date Time Provider Department Center  10/14/2021 11:15 AM Hurshel Party, CNM [facility] None  10/14/2021  3:15 PM [facility]  10/18/2021 12:00 AM [facility] SCHED ROOM [facility] None    Sharen Counter, CNM

## 2021-10-16 ENCOUNTER — Other Ambulatory Visit: Payer: Self-pay | Admitting: Advanced Practice Midwife

## 2021-10-16 ENCOUNTER — Telehealth (HOSPITAL_COMMUNITY): Payer: Self-pay | Admitting: *Deleted

## 2021-10-16 ENCOUNTER — Encounter (HOSPITAL_COMMUNITY): Payer: Self-pay | Admitting: *Deleted

## 2021-10-16 NOTE — Telephone Encounter (Signed)
Preadmission screen  

## 2021-10-18 ENCOUNTER — Inpatient Hospital Stay (HOSPITAL_COMMUNITY)
Admission: AD | Admit: 2021-10-18 | Discharge: 2021-10-20 | DRG: 807 | Disposition: A | Discharge status: Home / Self Care | Payer: BC Managed Care – PPO | Attending: Obstetrics and Gynecology | Admitting: Obstetrics and Gynecology

## 2021-10-18 ENCOUNTER — Inpatient Hospital Stay (HOSPITAL_COMMUNITY): Payer: BC Managed Care – PPO | Admitting: Anesthesiology

## 2021-10-18 ENCOUNTER — Inpatient Hospital Stay (HOSPITAL_COMMUNITY): Payer: BC Managed Care – PPO

## 2021-10-18 ENCOUNTER — Encounter (HOSPITAL_COMMUNITY): Payer: Self-pay | Admitting: Obstetrics & Gynecology

## 2021-10-18 ENCOUNTER — Other Ambulatory Visit: Payer: Self-pay

## 2021-10-18 DIAGNOSIS — O10919 Unspecified pre-existing hypertension complicating pregnancy, unspecified trimester: Secondary | ICD-10-CM

## 2021-10-18 DIAGNOSIS — O09292 Supervision of pregnancy with other poor reproductive or obstetric history, second trimester: Secondary | ICD-10-CM | POA: Diagnosis not present

## 2021-10-18 DIAGNOSIS — O34219 Maternal care for unspecified type scar from previous cesarean delivery: Secondary | ICD-10-CM

## 2021-10-18 DIAGNOSIS — O34211 Maternal care for low transverse scar from previous cesarean delivery: Secondary | ICD-10-CM | POA: Diagnosis not present

## 2021-10-18 DIAGNOSIS — O1002 Pre-existing essential hypertension complicating childbirth: Secondary | ICD-10-CM | POA: Diagnosis present

## 2021-10-18 DIAGNOSIS — Z3A39 39 weeks gestation of pregnancy: Secondary | ICD-10-CM

## 2021-10-18 DIAGNOSIS — O364XX0 Maternal care for intrauterine death, not applicable or unspecified: Secondary | ICD-10-CM | POA: Diagnosis present

## 2021-10-18 LAB — COMPREHENSIVE METABOLIC PANEL
ALT: 18 U/L (ref 0–44)
AST: 16 U/L (ref 15–41)
Albumin: 2.7 g/dL — ABNORMAL LOW (ref 3.5–5.0)
Alkaline Phosphatase: 110 U/L (ref 38–126)
Anion gap: 7 (ref 5–15)
BUN: 11 mg/dL (ref 6–20)
CO2: 22 mmol/L (ref 22–32)
Calcium: 9.2 mg/dL (ref 8.9–10.3)
Chloride: 106 mmol/L (ref 98–111)
Creatinine, Ser: 0.65 mg/dL (ref 0.44–1.00)
GFR, Estimated: >60 mL/min (ref >60)
Glucose, Bld: 82 mg/dL (ref 70–99)
Potassium: 3.8 mmol/L (ref 3.5–5.1)
Sodium: 135 mmol/L (ref 135–145)
Total Bilirubin: 0.4 mg/dL (ref 0.3–1.2)
Total Protein: 6.1 g/dL — ABNORMAL LOW (ref 6.5–8.1)

## 2021-10-18 LAB — PROTEIN / CREATININE RATIO, URINE
Creatinine, Urine: 65 mg/dL
Total Protein, Urine: <6 mg/dL

## 2021-10-18 LAB — CBC
HCT: 33.8 % — ABNORMAL LOW (ref 36.0–46.0)
Hemoglobin: 11.8 g/dL — ABNORMAL LOW (ref 12.0–15.0)
MCH: 30.3 pg (ref 26.0–34.0)
MCHC: 34.9 g/dL (ref 30.0–36.0)
MCV: 86.9 fL (ref 80.0–100.0)
Platelets: 260 10*3/uL (ref 150–400)
RBC: 3.89 MIL/uL (ref 3.87–5.11)
RDW: 13.3 % (ref 11.5–15.5)
WBC: 9.7 10*3/uL (ref 4.0–10.5)
nRBC: 0 % (ref 0.0–0.2)

## 2021-10-18 LAB — TYPE AND SCREEN
ABO/RH(D): B POS
Antibody Screen: NEGATIVE

## 2021-10-18 LAB — RPR: RPR Ser Ql: NONREACTIVE

## 2021-10-18 MED ORDER — ACETAMINOPHEN 325 MG PO TABS
650.0000 mg | ORAL_TABLET | ORAL | Status: DC | PRN
  Administered 2021-10-19 (×2): 650 mg via ORAL
  Filled 2021-10-18 (×2): qty 2

## 2021-10-18 MED ORDER — FENTANYL-BUPIVACAINE-NACL 0.5-0.125-0.9 MG/250ML-% EP SOLN
12.0000 mL/h | EPIDURAL | Status: DC | PRN
  Administered 2021-10-18: 12 mL/h via EPIDURAL
  Filled 2021-10-18: qty 250

## 2021-10-18 MED ORDER — LIDOCAINE-EPINEPHRINE (PF) 2 %-1:200000 IJ SOLN
INTRAMUSCULAR | Status: DC | PRN
  Administered 2021-10-18: 3 mL via EPIDURAL

## 2021-10-18 MED ORDER — FLEET ENEMA 7-19 GM/118ML RE ENEM: 1.0000 | ENEMA | Freq: Every day | RECTAL | Status: DC | PRN

## 2021-10-18 MED ORDER — TRANEXAMIC ACID-NACL 1000-0.7 MG/100ML-% IV SOLN
INTRAVENOUS | Status: AC
  Filled 2021-10-18: qty 100

## 2021-10-18 MED ORDER — IBUPROFEN 600 MG PO TABS
600.0000 mg | ORAL_TABLET | Freq: Four times a day (QID) | ORAL | Status: DC
  Administered 2021-10-18 – 2021-10-20 (×7): 600 mg via ORAL
  Filled 2021-10-18 (×7): qty 1

## 2021-10-18 MED ORDER — SOD CITRATE-CITRIC ACID 500-334 MG/5ML PO SOLN: 30.0000 mL | ORAL | Status: DC | PRN

## 2021-10-18 MED ORDER — WITCH HAZEL-GLYCERIN EX PADS: 1.0000 | MEDICATED_PAD | CUTANEOUS | Status: DC | PRN

## 2021-10-18 MED ORDER — BUPIVACAINE HCL (PF) 0.25 % IJ SOLN
INTRAMUSCULAR | Status: DC | PRN
  Administered 2021-10-18 (×2): 3 mL via EPIDURAL

## 2021-10-18 MED ORDER — MEASLES, MUMPS & RUBELLA VAC IJ SOLR: 0.5000 mL | Freq: Once | INTRAMUSCULAR | Status: DC

## 2021-10-18 MED ORDER — HYDROXYZINE HCL 50 MG PO TABS: 50.0000 mg | ORAL_TABLET | Freq: Four times a day (QID) | ORAL | Status: DC | PRN

## 2021-10-18 MED ORDER — FENTANYL CITRATE (PF) 100 MCG/2ML IJ SOLN
50.0000 ug | INTRAMUSCULAR | Status: DC | PRN
  Administered 2021-10-18 (×2): 100 ug via INTRAVENOUS
  Filled 2021-10-18 (×2): qty 2

## 2021-10-18 MED ORDER — SENNOSIDES-DOCUSATE SODIUM 8.6-50 MG PO TABS
2.0000 | ORAL_TABLET | Freq: Every day | ORAL | Status: DC
  Administered 2021-10-19 – 2021-10-20 (×2): 2 via ORAL
  Filled 2021-10-18 (×2): qty 2

## 2021-10-18 MED ORDER — EPHEDRINE 5 MG/ML INJ: 10.0000 mg | INTRAVENOUS | Status: DC | PRN

## 2021-10-18 MED ORDER — LABETALOL HCL 200 MG PO TABS
200.0000 mg | ORAL_TABLET | Freq: Two times a day (BID) | ORAL | Status: DC
  Administered 2021-10-18: 200 mg via ORAL
  Filled 2021-10-18: qty 1

## 2021-10-18 MED ORDER — DIPHENHYDRAMINE HCL 50 MG/ML IJ SOLN: 12.5000 mg | INTRAMUSCULAR | Status: DC | PRN

## 2021-10-18 MED ORDER — LACTATED RINGERS IV SOLN: 500.0000 mL | Freq: Once | INTRAVENOUS | Status: DC

## 2021-10-18 MED ORDER — COCONUT OIL OIL: 1.0000 | TOPICAL_OIL | Status: DC | PRN

## 2021-10-18 MED ORDER — MEDROXYPROGESTERONE ACETATE 150 MG/ML IM SUSP: 150.0000 mg | INTRAMUSCULAR | Status: DC | PRN

## 2021-10-18 MED ORDER — SODIUM BICARBONATE 8.4 % IV SOLN: INTRAVENOUS | Status: DC | PRN

## 2021-10-18 MED ORDER — OXYCODONE-ACETAMINOPHEN 5-325 MG PO TABS: 2.0000 | ORAL_TABLET | ORAL | Status: DC | PRN

## 2021-10-18 MED ORDER — ACETAMINOPHEN 325 MG PO TABS
650.0000 mg | ORAL_TABLET | ORAL | Status: DC | PRN
  Administered 2021-10-18: 650 mg via ORAL
  Filled 2021-10-18: qty 2

## 2021-10-18 MED ORDER — ONDANSETRON HCL 4 MG PO TABS: 4.0000 mg | ORAL_TABLET | ORAL | Status: DC | PRN

## 2021-10-18 MED ORDER — BENZOCAINE-MENTHOL 20-0.5 % EX AERO
1.0000 | INHALATION_SPRAY | CUTANEOUS | Status: DC | PRN
  Administered 2021-10-19: 1 via TOPICAL
  Filled 2021-10-18: qty 56

## 2021-10-18 MED ORDER — SIMETHICONE 80 MG PO CHEW: 80.0000 mg | CHEWABLE_TABLET | ORAL | Status: DC | PRN

## 2021-10-18 MED ORDER — DIBUCAINE (PERIANAL) 1 % EX OINT: 1.0000 | TOPICAL_OINTMENT | CUTANEOUS | Status: DC | PRN

## 2021-10-18 MED ORDER — DIPHENHYDRAMINE HCL 25 MG PO CAPS: 25.0000 mg | ORAL_CAPSULE | Freq: Four times a day (QID) | ORAL | Status: DC | PRN

## 2021-10-18 MED ORDER — ONDANSETRON HCL 4 MG/2ML IJ SOLN: 4.0000 mg | INTRAMUSCULAR | Status: DC | PRN

## 2021-10-18 MED ORDER — ONDANSETRON HCL 4 MG/2ML IJ SOLN: 4.0000 mg | Freq: Four times a day (QID) | INTRAMUSCULAR | Status: DC | PRN

## 2021-10-18 MED ORDER — LIDOCAINE HCL (PF) 1 % IJ SOLN: 30.0000 mL | INTRAMUSCULAR | Status: DC | PRN

## 2021-10-18 MED ORDER — TERBUTALINE SULFATE 1 MG/ML IJ SOLN: 0.2500 mg | Freq: Once | INTRAMUSCULAR | Status: DC | PRN

## 2021-10-18 MED ORDER — TRANEXAMIC ACID-NACL 1000-0.7 MG/100ML-% IV SOLN
1000.0000 mg | INTRAVENOUS | Status: AC
  Administered 2021-10-18: 1000 mg via INTRAVENOUS

## 2021-10-18 MED ORDER — TETANUS-DIPHTH-ACELL PERTUSSIS 5-2.5-18.5 LF-MCG/0.5 IM SUSY: 0.5000 mL | PREFILLED_SYRINGE | Freq: Once | INTRAMUSCULAR | Status: DC

## 2021-10-18 MED ORDER — OXYTOCIN-SODIUM CHLORIDE 30-0.9 UT/500ML-% IV SOLN
2.5000 [IU]/h | INTRAVENOUS | Status: DC
  Administered 2021-10-18 (×3): 2.5 [IU]/h via INTRAVENOUS
  Filled 2021-10-18: qty 500

## 2021-10-18 MED ORDER — LACTATED RINGERS IV SOLN: 500.0000 mL | INTRAVENOUS | Status: DC | PRN

## 2021-10-18 MED ORDER — PRENATAL MULTIVITAMIN CH
1.0000 | ORAL_TABLET | Freq: Every day | ORAL | Status: DC
  Administered 2021-10-19 – 2021-10-20 (×2): 1 via ORAL
  Filled 2021-10-18 (×2): qty 1

## 2021-10-18 MED ORDER — FUROSEMIDE 20 MG PO TABS
20.0000 mg | ORAL_TABLET | Freq: Every day | ORAL | Status: DC
Start: 2021-10-19 — End: 2021-10-20
  Administered 2021-10-19 – 2021-10-20 (×2): 20 mg via ORAL
  Filled 2021-10-18 (×2): qty 1

## 2021-10-18 MED ORDER — OXYTOCIN-SODIUM CHLORIDE 30-0.9 UT/500ML-% IV SOLN
1.0000 m[IU]/min | INTRAVENOUS | Status: DC
  Administered 2021-10-18: 2 m[IU]/min via INTRAVENOUS
  Filled 2021-10-18: qty 500

## 2021-10-18 MED ORDER — LACTATED RINGERS AMNIOINFUSION: INTRAVENOUS | Status: DC

## 2021-10-18 MED ORDER — PHENYLEPHRINE 80 MCG/ML (10ML) SYRINGE FOR IV PUSH (FOR BLOOD PRESSURE SUPPORT): 80.0000 ug | PREFILLED_SYRINGE | INTRAVENOUS | Status: DC | PRN

## 2021-10-18 MED ORDER — ZOLPIDEM TARTRATE 5 MG PO TABS: 5.0000 mg | ORAL_TABLET | Freq: Every evening | ORAL | Status: DC | PRN

## 2021-10-18 MED ORDER — LACTATED RINGERS IV SOLN: INTRAVENOUS | Status: DC

## 2021-10-18 MED ORDER — OXYCODONE-ACETAMINOPHEN 5-325 MG PO TABS: 1.0000 | ORAL_TABLET | ORAL | Status: DC | PRN

## 2021-10-18 MED ORDER — OXYTOCIN BOLUS FROM INFUSION
333.0000 mL | Freq: Once | INTRAVENOUS | Status: AC
  Administered 2021-10-18: 333 mL via INTRAVENOUS

## 2021-10-18 NOTE — H&P (Signed)
Deborah Steele is a 25 y.o. female presenting for Induction of labor for Chronic Hypertension controlled with Labetalol Denies headache, visual changes or upper abdominal pain.  She had a Cesarean Delivery with her first baby for failure to dilate (6cm) and a VBAC with her 22 week delivery of a fetal demise. .  Pregnancy has been followed at [facility] and remarkable for: Patient Active Problem List   Diagnosis Date Noted   Chronic hypertension during pregnancy, antepartum 10/18/2021   Gastroesophageal reflux during pregnancy in third trimester, antepartum 09/23/2021   Supervision of high risk pregnancy, antepartum 04/01/2021   H/O cold sores 04/24/2020   IUFD at 20 weeks or more of gestation 04/17/2020   Previous cesarean delivery affecting pregnancy 04/02/2020   Chronic hypertension affecting pregnancy 06/27/2019   History of gestational hypertension 05/02/2016     OB History     Gravida  3   Para  2   Term  1   Preterm  1   AB  0   Living  1      SAB  0   IAB  0   Ectopic  0   Multiple  0   Live Births  1          Past Medical History:  Diagnosis Date   Hypertension    Influenza 02/2016   Medical history non-contributory    Past Surgical History:  Procedure Laterality Date   CESAREAN SECTION N/A 05/04/2016   Procedure: CESAREAN SECTION;  Surgeon: Lesly Dukes, MD;  Location: [facility] BIRTHING SUITES;  Service: Obstetrics;  Laterality: N/A;   Family History: family history includes Asthma in an other family member; Hyperlipidemia in her mother; Hypertension in her mother. Social History:  reports that she has never smoked. She has never used smokeless tobacco. She reports that she does not currently use alcohol. She reports that she does not use drugs.     Maternal Diabetes: No Genetic Screening: Normal Maternal Ultrasounds/Referrals: Normal Fetal Ultrasounds or other Referrals:  None Maternal Substance Abuse:  No Significant Maternal Medications:  Meds  include: Other: Labetalol Significant Maternal Lab Results:  Group B Strep negative Number of Prenatal Visits:greater than 3 verified prenatal visits Other Comments:  None  Review of Systems  Constitutional:  Negative for chills and fever.  Eyes:  Negative for visual disturbance.  Respiratory:  Negative for shortness of breath.   Gastrointestinal:  Negative for abdominal pain, constipation, diarrhea and vomiting.  Genitourinary:  Negative for vaginal bleeding and vaginal pain.  Neurological:  Negative for weakness and headaches.   Maternal Medical History:  Reason for admission: Induction of Labor for Chronic Hypertension   Contractions: Frequency: irregular.   Perceived severity is mild.   Fetal activity: Perceived fetal activity is normal.   Last perceived fetal movement was within the past hour.   Prenatal complications: PIH.   No bleeding, IUGR, placental abnormality or preterm labor.   Prenatal Complications - Diabetes: none.   Dilation: 2 Effacement (%): 50 Station: -3 Exam by:: Andrey Campanile, RN Blood pressure 126/79, pulse 74, temperature 98.5 F (36.9 C), temperature source Oral, resp. rate 18, last menstrual period 01/18/2021. Maternal Exam:  Uterine Assessment: Contraction strength is mild.  Contraction frequency is irregular.  Abdomen: Patient reports no abdominal tenderness. Surgical scars: low transverse.   Fetal presentation: vertex Introitus: Normal vulva. Normal vagina.  Ferning test: not done.  Nitrazine test: not done. Pelvis: adequate for delivery.   Cervix: Cervix evaluated by digital exam.  Fetal Exam Fetal Monitor Review: Mode: ultrasound.   Baseline rate: 140.  Variability: moderate (6-25 bpm).   Pattern: accelerations present and no decelerations.   Fetal State Assessment: Category I - tracings are normal.   Physical Exam Constitutional:      General: She is not in acute distress.    Appearance: She is not ill-appearing or toxic-appearing.   HENT:     Head: Normocephalic.  Cardiovascular:     Rate and Rhythm: Normal rate.  Pulmonary:     Effort: Pulmonary effort is normal.  Abdominal:     General: There is no distension.     Tenderness: There is no abdominal tenderness. There is no guarding.  Genitourinary:    General: Normal vulva.     Comments: Dilation: 2 Effacement (%): 50 Cervical Position: Posterior Station: -3 Presentation: Vertex Exam by:: Andrey Campanile, RN  Musculoskeletal:        General: Normal range of motion.     Cervical back: Normal range of motion.  Skin:    General: Skin is warm and dry.  Neurological:     General: No focal deficit present.     Mental Status: She is alert.  Psychiatric:        Mood and Affect: Mood normal.     Prenatal labs: ABO, Rh: --/--/PENDING (07/28 0050) Antibody: PENDING (07/28 0050) Rubella: 13.10 (01/16 1419) RPR: Non Reactive (05/10 0941)  HBsAg: Negative (01/16 1419)  HIV: Non Reactive (05/10 0941)  GBS: Negative/-- (07/17 1151)   Assessment/Plan: Single IUP at [redacted]w[redacted]d Chronic Hypertension Prior Cesarean Delivery, desires TOLAC  Admit to Labor and Delivery Routine orders Pitocin per protocol Anticipate SVD   Wynelle Bourgeois 10/18/2021, 1:29 AM

## 2021-10-18 NOTE — Progress Notes (Signed)
Labor Progress Note Lashunda Greis is a 26 y.o. G3P1101 at [redacted]w[redacted]d presented for IOL for CHTN  S: Pt tolerating labor well. She has epidural and pitocin titration. She no longer has foley balloon. She desires AROM.  O:  BP 135/83   Pulse 69   Temp 98.2 F (36.8 C)   Resp 18   Ht 5\' 2"  (1.575 m)   Wt 86.9 kg   LMP 01/18/2021   SpO2 100%   BMI 35.04 kg/m  EFM: 130/moderate/15x15 accels. No decels  CVE: Dilation: 6 Effacement (%): 80 Cervical Position: Posterior Station: -2, -3 Presentation: Vertex Exam by:: [email or web address]   A&P: 26 y.o. G3P1101 [redacted]w[redacted]d IOL for cHTN. #Labor: Progressing well. AROM clear. Continue pitocin. SVE 6/80/-2 #Pain: epidural #FWB: CAT 1 #GBS negative # CHTN: BP controlled. cont labetolol 200mg  BID  03-27-1973, DO 9:38 AM

## 2021-10-18 NOTE — Anesthesia Preprocedure Evaluation (Signed)
Anesthesia Evaluation  Patient identified by MRN, date of birth, ID band Patient awake    Reviewed: Allergy & Precautions, Patient's Chart, lab work & pertinent test results  History of Anesthesia Complications Negative for: history of anesthetic complications  Airway Mallampati: III  TM Distance: >3 FB Neck ROM: Full    Dental  (+) Teeth Intact, Dental Advisory Given   Pulmonary neg pulmonary ROS,    breath sounds clear to auscultation       Cardiovascular hypertension, Pt. on medications  Rhythm:Regular     Neuro/Psych negative neurological ROS  negative psych ROS   GI/Hepatic Neg liver ROS, GERD  ,  Endo/Other  negative endocrine ROS  Renal/GU negative Renal ROS     Musculoskeletal   Abdominal   Peds  Hematology  (+) Blood dyscrasia, anemia , Lab Results      Component                Value               Date                      WBC                      9.7                 10/18/2021                HGB                      11.8 (L)            10/18/2021                HCT                      33.8 (L)            10/18/2021                MCV                      86.9                10/18/2021                PLT                      260                 10/18/2021              Anesthesia Other Findings   Reproductive/Obstetrics (+) Pregnancy                             Anesthesia Physical Anesthesia Plan  ASA: 2  Anesthesia Plan: Epidural   Post-op Pain Management:    Induction:   PONV Risk Score and Plan: 2 and Treatment may vary due to age or medical condition  Airway Management Planned: Natural Airway  Additional Equipment: None  Intra-op Plan:   Post-operative Plan:   Informed Consent: I have reviewed the patients History and Physical, chart, labs and discussed the procedure including the risks, benefits and alternatives for the proposed anesthesia with the patient  or authorized representative who has indicated his/her understanding and acceptance.  Plan Discussed with:   Anesthesia Plan Comments:         Anesthesia Quick Evaluation

## 2021-10-18 NOTE — Anesthesia Procedure Notes (Signed)
Epidural Patient location during procedure: OB Start time: 10/18/2021 7:45 AM End time: 10/18/2021 8:15 AM  Staffing Anesthesiologist: Val Eagle, MD Performed: anesthesiologist   Preanesthetic Checklist Completed: patient identified, IV checked, risks and benefits discussed, monitors and equipment checked, pre-op evaluation and timeout performed  Epidural Patient position: sitting Prep: DuraPrep Patient monitoring: heart rate, continuous pulse ox and blood pressure Approach: midline Location: L3-L4 Injection technique: LOR saline  Needle:  Needle type: Tuohy  Needle gauge: 17 G Needle length: 9 cm Catheter type: closed end flexible Catheter size: 19 Gauge Catheter at skin depth: 13 cm Test dose: negative and 2% lidocaine with Epi 1:200 K  Assessment Events: blood not aspirated, injection not painful, no injection resistance, no paresthesia and negative IV test

## 2021-10-18 NOTE — Discharge Summary (Signed)
Postpartum Discharge Summary  Date of Service updated***     Patient Name: Deborah Steele DOB: 08/17/1995 MRN: [id number]  Date of admission: 10/18/2021 Delivery date:  Delivering provider: Renard Matter  Date of discharge: 10/18/2021  Admitting diagnosis: Chronic hypertension during pregnancy, antepartum [O10.919] Intrauterine pregnancy: [redacted]w[redacted]d    Secondary diagnosis:  Principal Problem:   Chronic hypertension during pregnancy, antepartum Active Problems:   Previous cesarean delivery affecting pregnancy   IUFD at 230weeks or more of gestation   VBAC (vaginal birth after Cesarean)  Additional problems: ***None    Discharge diagnosis: Term Pregnancy Delivered, VBAC, and CHTN                                              Post partum procedures:*** Augmentation: AROM, Pitocin, and IP Foley Complications: None  Hospital course: Induction of Labor With Vaginal Delivery   26y.o. yo G3P1101 at 316w0das admitted to the hospital 10/18/2021 for induction of labor.  Indication for induction:  cHTN .  Patient had an uncomplicated labor course as follows: Membrane Rupture Time/Date: 9:30 AM ,10/18/2021   Delivery Method:VBAC, Spontaneous  Episiotomy: None  Lacerations:  2nd degree;Perineal  Details of delivery can be found in separate delivery note.  Patient had a routine postpartum course. Patient is discharged home 10/18/21.  Newborn Data: Birth date:  Birth time:6:06 PM  Gender:Female  Living status:Living  Apgars:8 ,9  Weight:   Magnesium Sulfate received: No BMZ received: No Rhophylac:N/A MMR:N/A T-DaP:Given prenatally Flu: No Transfusion:{Transfusion received:30440034}  Physical exam  Vitals:   10/18/21 1800 10/18/21 1810 10/18/21 1830 10/18/21 1846  BP: (!) 159/77 (!) 158/82 (!) 142/71 133/81  Pulse: 79 97 74 82  Resp:   18   Temp:      TempSrc:      SpO2:      Weight:      Height:       General: {Exam; general:21111117} Lochia: {Desc;  appropriate/inappropriate:30686::"appropriate"} Uterine Fundus: {Desc; firm/soft:30687} Incision: {Exam; incision:21111123} DVT Evaluation: {Exam; dvt:2111122} Labs: Lab Results  Component Value Date   WBC 9.7 10/18/2021   HGB 11.8 (L) 10/18/2021   HCT 33.8 (L) 10/18/2021   MCV 86.9 10/18/2021   PLT 260 10/18/2021      Latest Ref Rng & Units 10/18/2021   12:50 AM  CMP  Glucose 70 - 99 mg/dL 82   BUN 6 - 20 mg/dL 11   Creatinine 0.44 - 1.00 mg/dL 0.65   Sodium 135 - 145 mmol/L 135   Potassium 3.5 - 5.1 mmol/L 3.8   Chloride 98 - 111 mmol/L 106   CO2 22 - 32 mmol/L 22   Calcium 8.9 - 10.3 mg/dL 9.2   Total Protein 6.5 - 8.1 g/dL 6.1   Total Bilirubin 0.3 - 1.2 mg/dL 0.4   Alkaline Phos 38 - 126 U/L 110   AST 15 - 41 U/L 16   ALT 0 - 44 U/L 18    Edinburgh Score:    05/17/2020    1:22 PM  Edinburgh Postnatal Depression Scale Screening Tool  I have been able to laugh and see the funny side of things. 0  I have looked forward with enjoyment to things. 0  I have blamed myself unnecessarily when things went wrong. 0  I have been anxious or worried for no good reason. 0  I  have felt scared or panicky for no good reason. 0  Things have been getting on top of me. 1  I have been so unhappy that I have had difficulty sleeping. 0  I have felt sad or miserable. 1  I have been so unhappy that I have been crying. 1  The thought of harming myself has occurred to me. 0  Edinburgh Postnatal Depression Scale Total 3     After visit meds:  Allergies as of 10/18/2021   No Known Allergies   Med Rec must be completed prior to using this [facility]***        Discharge home in stable condition Infant Feeding: {Baby feeding:23562} Infant Disposition:{CHL IP OB HOME WITH MOLMBE:67544} Discharge instruction: per After Visit Summary and Postpartum booklet. Activity: Advance as tolerated. Pelvic rest for 6 weeks.  Diet: {OB BEEF:00712197} Future Appointments:No future  appointments. Follow up Visit: Message sent to [facility] by Dr. Cy Blamer on 7/28  Please schedule this patient for a In person postpartum visit in 6 weeks with the following provider: Any provider. Additional Postpartum F/U:BP check 1 week  High risk pregnancy complicated by: HTN Delivery mode:  VBAC, Spontaneous  Anticipated Birth Control:   plans outpatient IUD   10/18/2021 Renard Matter, MD

## 2021-10-18 NOTE — Progress Notes (Signed)
Patient ID: Deborah Steele, female   DOB: [birth year], 26 y.o.   MRN: [id number] Starting to have more pain with contractions  Vitals:   10/18/21 0431 10/18/21 0502 10/18/21 0505 10/18/21 0531  BP: (!) 147/93 120/62  114/79  Pulse: 88 69  72  Resp:      Temp:   98.2 F (36.8 C)   TempSrc:   Oral   Weight:      Height:       FHR reactive UCs every 2-3 min  Dilation: 2 Effacement (%): 70 Cervical Position: Posterior Station: -3 Presentation: Vertex Exam by:: Artelia Laroche, CNM  Foley balloon placed

## 2021-10-18 NOTE — Progress Notes (Signed)
Labor Progress Note Navreet Bolda is a 26 y.o. G3P1101 at [redacted]w[redacted]d presented for IOL for cHTN  S: Pt doing fine, comfortable. Minimal pain.   O:  BP 129/75   Pulse 67   Temp 98.2 F (36.8 C) (Oral)   Resp 18   Ht 5\' 2"  (1.575 m)   Wt 86.9 kg   LMP 01/18/2021   SpO2 99%   BMI 35.04 kg/m  EFM: 130/moderate/15x15 accels, no decels  CVE: Dilation: 6 Effacement (%): 80 Cervical Position: Posterior Station: -2 Presentation: Vertex Exam by:: Merc-Ortez MD   A&P: 26 y.o. G3P1101 [redacted]w[redacted]d IOL fro cHTN #Labor: Progressing well. Contractions not traced well, IUPC placed. Pt tolerated well. #Pain: epidural #FWB: CAT 1 #GBS negative #cHTN: Labetolol 200mg  BID  [redacted]w[redacted]d Mercado-Ortiz, DO 12:30 PM

## 2021-10-19 LAB — CBC
HCT: 30 % — ABNORMAL LOW (ref 36.0–46.0)
Hemoglobin: 10.1 g/dL — ABNORMAL LOW (ref 12.0–15.0)
MCH: 29.4 pg (ref 26.0–34.0)
MCHC: 33.7 g/dL (ref 30.0–36.0)
MCV: 87.5 fL (ref 80.0–100.0)
Platelets: 224 10*3/uL (ref 150–400)
RBC: 3.43 MIL/uL — ABNORMAL LOW (ref 3.87–5.11)
RDW: 13.4 % (ref 11.5–15.5)
WBC: 13.4 10*3/uL — ABNORMAL HIGH (ref 4.0–10.5)
nRBC: 0 % (ref 0.0–0.2)

## 2021-10-19 MED ORDER — OXYCODONE HCL 5 MG PO TABS
5.0000 mg | ORAL_TABLET | ORAL | Status: DC | PRN
  Administered 2021-10-19: 5 mg via ORAL
  Filled 2021-10-19: qty 1

## 2021-10-19 NOTE — Anesthesia Postprocedure Evaluation (Signed)
Anesthesia Post Note  Patient: Scientist, water quality  Procedure(s) Performed: AN AD HOC LABOR EPIDURAL     Patient location during evaluation: Mother Baby Anesthesia Type: Epidural Level of consciousness: awake Pain management: satisfactory to patient Vital Signs Assessment: post-procedure vital signs reviewed and stable Respiratory status: spontaneous breathing Cardiovascular status: stable Anesthetic complications: no   No notable events documented.  Last Vitals:  Vitals:   10/19/21 0508 10/19/21 0926  BP: 116/76 121/68  Pulse: 70 75  Resp: 18 16  Temp:  36.9 C  SpO2:      Last Pain:  Vitals:   10/19/21 0926  TempSrc: Oral  PainSc:    Pain Goal: Patients Stated Pain Goal: 2 (10/18/21 0721)                 Cephus Shelling

## 2021-10-19 NOTE — Progress Notes (Signed)
Post Partum Day 1 Subjective: Patient overall doing well but reports lower abdominal cramping is 8/10 and ibuprofen and tylenol are not helping.   Objective: Blood pressure 116/76, pulse 70, temperature 98.4 F (36.9 C), temperature source Oral, resp. rate 18, height 5\' 2"  (1.575 m), weight 86.9 kg, last menstrual period 01/18/2021, SpO2 98 %, unknown if currently breastfeeding.  Physical Exam:  General: alert, cooperative, and no distress Lochia: appropriate Uterine Fundus: firm Incision: n/a DVT Evaluation: No evidence of DVT seen on physical exam.  Recent Labs    10/18/21 0050 10/19/21 0500  HGB 11.8* 10.1*  HCT 33.8* 30.0*    Assessment/Plan: Provided reassurance of normalcy of worsening uterine cramping with each pregnancy. Oxy IR added PRN.   Patient unsure if she desires circumcision and will discuss with partner and let team know if they desire before discharge.   Plan for discharge home tomorrow.    LOS: 1 day   10/21/21, Rolm Bookbinder 10/19/2021, 9:35 AM

## 2021-10-20 MED ORDER — ACETAMINOPHEN 325 MG PO TABS
650.0000 mg | ORAL_TABLET | ORAL | 0 refills | Status: AC | PRN
Start: 2021-10-20 — End: 2021-11-19

## 2021-10-20 MED ORDER — FUROSEMIDE 20 MG PO TABS
20.0000 mg | ORAL_TABLET | Freq: Every day | ORAL | 0 refills | Status: DC
Start: 2021-10-20 — End: 2022-01-23

## 2021-10-20 MED ORDER — IBUPROFEN 600 MG PO TABS: 600.0000 mg | ORAL_TABLET | Freq: Four times a day (QID) | ORAL | 0 refills | Status: AC

## 2021-10-21 ENCOUNTER — Encounter: Payer: BC Managed Care – PPO | Admitting: Obstetrics & Gynecology

## 2021-10-22 ENCOUNTER — Ambulatory Visit (INDEPENDENT_AMBULATORY_CARE_PROVIDER_SITE_OTHER): Payer: BC Managed Care – PPO | Admitting: Family Medicine

## 2021-10-22 ENCOUNTER — Telehealth: Payer: Self-pay

## 2021-10-22 ENCOUNTER — Encounter: Payer: Self-pay | Admitting: Obstetrics and Gynecology

## 2021-10-22 ENCOUNTER — Encounter: Payer: Self-pay | Admitting: Family Medicine

## 2021-10-22 VITALS — BP 131/94 | HR 96 | Ht 62.0 in | Wt 196.7 lb

## 2021-10-22 DIAGNOSIS — N61 Mastitis without abscess: Secondary | ICD-10-CM | POA: Diagnosis not present

## 2021-10-22 DIAGNOSIS — I10 Essential (primary) hypertension: Secondary | ICD-10-CM | POA: Diagnosis not present

## 2021-10-22 MED ORDER — LABETALOL HCL 200 MG PO TABS: 200.0000 mg | ORAL_TABLET | Freq: Two times a day (BID) | ORAL | 3 refills | Status: AC

## 2021-10-22 MED ORDER — CLINDAMYCIN HCL 300 MG PO CAPS: 300.0000 mg | ORAL_CAPSULE | Freq: Four times a day (QID) | ORAL | 0 refills | Status: AC

## 2021-10-22 NOTE — Progress Notes (Signed)
Pt presents for fever, chill and sweats since 4am. Fever was alleviated with tylenol. Pt also c/o headache that is not alleviated with OTC medicaion. Pt denies visual disturbances but states she is having sensitivity to light.

## 2021-10-22 NOTE — Telephone Encounter (Signed)
S/w pt and she stated that she has been taking Tylenol, last dose 2 hrs ago and fever is at 100.7 and she has HA, pt states that BP is normal at 122/73. Pt reports breast pain and states that she tried to breastfeed and then stopped. Advised of possible mastitis, advised will get scheduled for office appt today.

## 2021-10-22 NOTE — Progress Notes (Signed)
    Interim Post Partum Visit Note  Deborah Steele is a 26 y.o. G61P2102 female who presents for a mastitis concern. She is 4 days postpartum following a VBAC.  I have fully reviewed the prenatal and intrapartum course. The delivery was at 39 gestational weeks.  Anesthesia: epidural. Postpartum course has been complicated by fever and breast pain that started this morning. Baby is doing well. Baby is feeding by bottle - and sometimes breast . Bleeding thin lochia. Bowel function is normal. Bladder function is normal. Patient is not sexually active. Contraception method is none.   Patient presents with concerns of bilateral breast pain and engorgement associated fevers and headache that started this morning around 4 AM.  She was on labetalol for chronic hypertension affecting pregnancy, but this was stopped upon discharge for normalized blood pressures.  She completed her Lasix for 3 days after discharge as well.  She has no known sick contacts.  She has attempted to breast-feed but has been doing so inconsistently and her breasts have consequently become hardened, engorged and tender to palpation, somewhat erythematous.  The pregnancy intention screening data noted above was reviewed. Potential methods of contraception were discussed. The patient elected to proceed with No data recorded.    Health Maintenance Due  Topic Date Due   COVID-19 Vaccine (1) Never done   PAP-Cervical Cytology Screening  09/22/2020   PAP SMEAR-Modifier  09/22/2020   INFLUENZA VACCINE  10/22/2021    The following portions of the patient's history were reviewed and updated as appropriate: allergies, current medications, past medical history, past surgical history, and problem list.  Review of Systems Pertinent items are noted in HPI.  Objective:  LMP 01/18/2021    General:  alert, cooperative, and appears stated age   Breasts:  abnormal engorged, hardened and tender to palpation.  Left breast has erythematous  confluence from nipple area extending out medially  Lungs: Normal effort  Heart:  regular rate and rhythm  Abdomen: soft, non-tender; bowel sounds normal; no masses,  no organomegaly   Wound Nonapplicable  GU exam:  not indicated       Assessment/plan:  1. Mastitis Patient physical exam and HPI consistent with a mastitis diagnosis.  Sent antibiotics per below.  Patient has a blood pressure follow-up in a week, discussed to update nurse about her symptomatology at that time.  If patient persists with fevers and headaches after 24 hours of antibiotics, suggested to call back for follow-up visit. - clindamycin (CLEOCIN) 300 MG capsule; Take 1 capsule (300 mg total) by mouth 4 (four) times daily for 14 days.  Dispense: 56 capsule; Refill: 0  2. Chronic hypertension Blood pressure is elevated to 130s/90s here in clinic.  Restart her labetalol given headache and elevated BP.  Gave preeclampsia precautions. - labetalol (NORMODYNE) 200 MG tablet; Take 1 tablet (200 mg total) by mouth 2 (two) times daily.  Dispense: 60 tablet; Refill: 3   Myrtie Hawk, DO [facility] [employer], [facility]

## 2021-10-28 ENCOUNTER — Ambulatory Visit (INDEPENDENT_AMBULATORY_CARE_PROVIDER_SITE_OTHER): Payer: BC Managed Care – PPO

## 2021-10-28 ENCOUNTER — Ambulatory Visit: Payer: BC Managed Care – PPO

## 2021-10-28 DIAGNOSIS — I10 Essential (primary) hypertension: Secondary | ICD-10-CM

## 2021-10-28 NOTE — Progress Notes (Signed)
Subjective:  Deborah Steele is a 26 y.o. female here for BP check s/p VBAC on 10/18/21. Pt c/o low milk supply.  Hypertension ROS: taking medications as instructed, no medication side effects noted, no TIA's, no chest pain on exertion, no dyspnea on exertion, and no swelling of ankles.    Objective:  BP 122/77   Pulse 79   Wt 174 lb 12.8 oz (79.3 kg)   LMP 01/18/2021   BMI 31.97 kg/m   Appearance alert, well appearing, and in no distress. General exam BP noted to be well controlled today in office.    Assessment:   Blood Pressure well controlled.   Plan:  Current treatment plan is effective, no change in therapy. Continue to check BP at home as directed  Pt aware it may take some time to produce an adequate amount of milk. Continue to breast feed and/or pump. Lactation consultant contact information given to patient for further assistance.  Keep upcoming pp visit

## 2021-12-10 ENCOUNTER — Ambulatory Visit: Payer: BC Managed Care – PPO | Admitting: Obstetrics & Gynecology

## 2021-12-12 ENCOUNTER — Encounter: Payer: Self-pay | Admitting: Obstetrics and Gynecology

## 2021-12-12 ENCOUNTER — Other Ambulatory Visit (HOSPITAL_COMMUNITY)
Admission: RE | Admit: 2021-12-12 | Discharge: 2021-12-12 | Disposition: A | Discharge status: Home / Self Care | Payer: BC Managed Care – PPO | Source: Ambulatory Visit | Attending: Obstetrics & Gynecology | Admitting: Obstetrics & Gynecology

## 2021-12-12 ENCOUNTER — Ambulatory Visit (INDEPENDENT_AMBULATORY_CARE_PROVIDER_SITE_OTHER): Payer: BC Managed Care – PPO | Admitting: Obstetrics and Gynecology

## 2021-12-12 VITALS — BP 135/95 | HR 80 | Wt 174.0 lb

## 2021-12-12 DIAGNOSIS — Z30430 Encounter for insertion of intrauterine contraceptive device: Secondary | ICD-10-CM

## 2021-12-12 LAB — POCT URINE PREGNANCY: Preg Test, Ur: NEGATIVE

## 2021-12-12 MED ORDER — LEVONORGESTREL 20 MCG/DAY IU IUD
1.0000 | INTRAUTERINE_SYSTEM | Freq: Once | INTRAUTERINE | Status: AC
  Administered 2021-12-12: 1 via INTRAUTERINE

## 2021-12-12 NOTE — Progress Notes (Signed)
Post Partum Visit Note  Deborah Steele is a 26 y.o. [id number] female who presents for a postpartum visit. She is 8 weeks postpartum following a normal spontaneous vaginal delivery.  I have fully reviewed the prenatal and intrapartum course. The delivery was at 39 gestational weeks.  Anesthesia: epidural. Postpartum course has been complicated by treated mastitis. Patient reports complete resolution. Baby is doing well. Baby is feeding by bottle - Similac . Bleeding no bleeding. Bowel function is normal. Bladder function is normal. Patient is not sexually active. Contraception method is abstinence. Pt is interested in IUD today.  Postpartum depression screening: negative, score 0.   The pregnancy intention screening data noted above was reviewed. Potential methods of contraception were discussed. The patient elected to proceed with No data recorded.   Edinburgh Postnatal Depression Scale - 12/12/21 1601       Edinburgh Postnatal Depression Scale:  In the Past 7 Days   I have been able to laugh and see the funny side of things. 0    I have looked forward with enjoyment to things. 0    I have blamed myself unnecessarily when things went wrong. 0    I have been anxious or worried for no good reason. 0    I have felt scared or panicky for no good reason. 0    Things have been getting on top of me. 0    I have been so unhappy that I have had difficulty sleeping. 0    I have felt sad or miserable. 0    I have been so unhappy that I have been crying. 0    The thought of harming myself has occurred to me. 0    Edinburgh Postnatal Depression Scale Total 0             Health Maintenance Due  Topic Date Due   COVID-19 Vaccine (1) Never done   PAP-Cervical Cytology Screening  09/22/2020   PAP SMEAR-Modifier  09/22/2020   INFLUENZA VACCINE  10/22/2021       Review of Systems Pertinent items noted in HPI and remainder of comprehensive ROS otherwise negative.  Objective:  BP (!) 135/95    Pulse 80   Wt 174 lb (78.9 kg)   LMP 12/05/2021 (Approximate)   BMI 31.83 kg/m    General:  alert, cooperative, and no distress   Breasts:  normal  Lungs: clear to auscultation bilaterally  Heart:  regular rate and rhythm  Abdomen: soft, non-tender; bowel sounds normal; no masses,  no organomegaly   Wound N/a  GU exam:  normal       Assessment:    There are no diagnoses linked to this encounter.  Normal postpartum exam.   Plan:   Essential components of care per ACOG recommendations:  1.  Mood and well being: Patient with negative depression screening today. Reviewed local resources for support.  - Patient tobacco use? No.   - hx of drug use? No.    2. Infant care and feeding:  -Patient currently breastmilk feeding? No.  -Social determinants of health (SDOH) reviewed in EPIC. No concerns  3. Sexuality, contraception and birth spacing - Patient does not want a pregnancy in the next year.  Desired family size is 3 children.  - Reviewed reproductive life planning. Reviewed contraceptive methods based on pt preferences and effectiveness.  Patient desired IUD or IUS today.   - Discussed birth spacing of 18 months IUD Procedure Note Patient identified, informed consent performed,  signed copy in chart, time out was performed.  Urine pregnancy test negative.  Speculum placed in the vagina.  Cervix visualized.  Cleaned with Betadine x 2.  Grasped anteriorly with a single tooth tenaculum.  Uterus sounded to 8 cm.  Mirena IUD placed per manufacturer's recommendations.  Strings trimmed to 3 cm. Tenaculum was removed, good hemostasis noted.  Patient tolerated procedure well.   Patient given post procedure instructions and Mirena care card with expiration date.  Patient is asked to check IUD strings periodically and follow up in 4-6 weeks for IUD check.   4. Sleep and fatigue -Encouraged family/partner/community support of 4 hrs of uninterrupted sleep to help with mood and  fatigue  5. Physical Recovery  - Discussed patients delivery and complications. She describes her labor as good. - Patient had a Vaginal, no problems at delivery. Patient had a 2nd degree laceration. Perineal healing reviewed. Patient expressed understanding - Patient has urinary incontinence? No. - Patient is safe to resume physical and sexual activity  6.  Health Maintenance - HM due items addressed Yes - Last pap smear  Diagnosis  Date Value Ref Range Status  09/22/2017   Final   NEGATIVE FOR INTRAEPITHELIAL LESIONS OR MALIGNANCY.   Pap smear done at today's visit.  -Breast Cancer screening indicated? No.   7. Chronic Disease/Pregnancy Condition follow up: Hypertension- Patient instructed to restart labetalol which she discontinued 2 weeks ago and follow up with PCP  - PCP follow up  Mora Bellman, [place] [facility] [place], [place]

## 2021-12-16 LAB — CYTOLOGY - PAP: Diagnosis: NEGATIVE

## 2021-12-27 ENCOUNTER — Encounter: Payer: Self-pay | Admitting: Obstetrics and Gynecology

## 2022-01-13 ENCOUNTER — Ambulatory Visit: Payer: BC Managed Care – PPO | Admitting: Obstetrics and Gynecology

## 2022-01-23 ENCOUNTER — Ambulatory Visit: Payer: BC Managed Care – PPO | Admitting: Obstetrics and Gynecology

## 2022-01-23 ENCOUNTER — Encounter: Payer: Self-pay | Admitting: Obstetrics and Gynecology

## 2022-01-23 ENCOUNTER — Ambulatory Visit (INDEPENDENT_AMBULATORY_CARE_PROVIDER_SITE_OTHER): Payer: BC Managed Care – PPO | Admitting: Obstetrics and Gynecology

## 2022-01-23 VITALS — BP 125/82 | HR 79 | Wt 172.0 lb

## 2022-01-23 DIAGNOSIS — Z30431 Encounter for routine checking of intrauterine contraceptive device: Secondary | ICD-10-CM | POA: Diagnosis not present

## 2022-01-23 NOTE — Progress Notes (Signed)
26 yo P2 here for IUD check. Patient had Mirena IUD inserted on 12/12/21. She reports feeling well since insertion and denies pelvic pain. She reports a period that lasted longer than usual (10 days) followed by intermittent vaginal spotting. Patient denies dyspareunia  Past Medical History:  Diagnosis Date   Hypertension    Influenza 02/2016   Medical history non-contributory    Past Surgical History:  Procedure Laterality Date   CESAREAN SECTION N/A 05/04/2016   Procedure: CESAREAN SECTION;  Surgeon: Guss Bunde, MD;  Location: [place];  Service: Obstetrics;  Laterality: N/A;   Family History  Problem Relation Age of Onset   Hyperlipidemia Mother    Hypertension Mother    Asthma Other    Birth defects Neg Hx    Diabetes Neg Hx    Heart disease Neg Hx    Stroke Neg Hx    Social History   Tobacco Use   Smoking status: Never   Smokeless tobacco: Never  Vaping Use   Vaping Use: Never used  Substance Use Topics   Alcohol use: Not Currently    Comment: occ prior to preg   Drug use: No   ROS See pertinent in HPI. All other systems reviewed and non contributory Blood pressure 125/82, pulse 79, weight 172 lb (78 kg), last menstrual period 01/05/2022, not currently breastfeeding. GENERAL: Well-developed, well-nourished female in no acute distress.  ABDOMEN: Soft, nontender, nondistended. No organomegaly. PELVIC: Normal external female genitalia. Vagina is pink and rugated.  Normal discharge. Normal appearing cervix with IUD strings visualized at the os. Chaperone present during the pelvic exam EXTREMITIES: No cyanosis, clubbing, or edema, 2+ distal pulses.   A/P 26 yo here for IUD check - Reassurance provided - IUD appears to be in the appropriate location - Annual exam due in 1 year - Patient advised to follow up with dermatology regarding skin tags on face - RTC prn

## 2022-01-23 NOTE — Progress Notes (Signed)
Pt states she is having some type of bleeding daily.  Pt states her last cycle lasted about 10 days.   Pt also has question about skin tags on face.
